# Patient Record
Sex: Female | Born: 1965
Health system: Southern US, Community
[De-identification: ages and names within clinical notes are randomized; demographics above are authoritative.]

## PROBLEM LIST (undated history)

## (undated) DIAGNOSIS — M1990 Unspecified osteoarthritis, unspecified site: Secondary | ICD-10-CM

## (undated) DIAGNOSIS — I10 Essential (primary) hypertension: Secondary | ICD-10-CM

## (undated) DIAGNOSIS — T7840XA Allergy, unspecified, initial encounter: Secondary | ICD-10-CM

## (undated) HISTORY — DX: Allergy, unspecified, initial encounter: T78.40XA

## (undated) HISTORY — PX: DILATION AND CURETTAGE OF UTERUS: SHX78

## (undated) HISTORY — DX: Essential (primary) hypertension: I10

## (undated) HISTORY — DX: Unspecified osteoarthritis, unspecified site: M19.90

---
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
## 2001-05-07 ENCOUNTER — Emergency Department (HOSPITAL_COMMUNITY): Admission: EM | Admit: 2001-05-07 | Discharge: 2001-05-07 | Payer: Self-pay | Admitting: *Deleted

## 2001-05-07 ENCOUNTER — Encounter: Payer: Self-pay | Admitting: Emergency Medicine

## 2001-06-16 ENCOUNTER — Other Ambulatory Visit: Admission: RE | Admit: 2001-06-16 | Discharge: 2001-06-16 | Payer: Self-pay | Admitting: Obstetrics and Gynecology

## 2001-09-06 ENCOUNTER — Encounter: Admission: RE | Admit: 2001-09-06 | Discharge: 2001-09-06 | Payer: Self-pay | Admitting: Family Medicine

## 2001-09-06 ENCOUNTER — Encounter: Payer: Self-pay | Admitting: Family Medicine

## 2002-07-03 ENCOUNTER — Other Ambulatory Visit: Admission: RE | Admit: 2002-07-03 | Discharge: 2002-07-03 | Payer: Self-pay | Admitting: Obstetrics and Gynecology

## 2002-07-11 ENCOUNTER — Encounter: Payer: Self-pay | Admitting: Obstetrics and Gynecology

## 2002-07-11 ENCOUNTER — Encounter: Admission: RE | Admit: 2002-07-11 | Discharge: 2002-07-11 | Payer: Self-pay | Admitting: Obstetrics and Gynecology

## 2002-08-29 ENCOUNTER — Encounter: Admission: RE | Admit: 2002-08-29 | Discharge: 2002-08-29 | Payer: Self-pay | Admitting: Family Medicine

## 2002-08-29 ENCOUNTER — Encounter: Payer: Self-pay | Admitting: Family Medicine

## 2002-08-31 ENCOUNTER — Inpatient Hospital Stay (HOSPITAL_COMMUNITY): Admission: AD | Admit: 2002-08-31 | Discharge: 2002-09-01 | Payer: Self-pay | Admitting: Family Medicine

## 2002-08-31 ENCOUNTER — Encounter: Payer: Self-pay | Admitting: Family Medicine

## 2003-08-28 ENCOUNTER — Other Ambulatory Visit: Admission: RE | Admit: 2003-08-28 | Discharge: 2003-08-28 | Payer: Self-pay | Admitting: Obstetrics and Gynecology

## 2004-01-02 ENCOUNTER — Encounter (INDEPENDENT_AMBULATORY_CARE_PROVIDER_SITE_OTHER): Payer: Self-pay | Admitting: Specialist

## 2004-01-02 ENCOUNTER — Ambulatory Visit (HOSPITAL_COMMUNITY): Admission: RE | Admit: 2004-01-02 | Discharge: 2004-01-02 | Payer: Self-pay | Admitting: Obstetrics and Gynecology

## 2004-12-02 ENCOUNTER — Ambulatory Visit: Payer: Self-pay | Admitting: Family Medicine

## 2005-01-12 ENCOUNTER — Emergency Department (HOSPITAL_COMMUNITY): Admission: EM | Admit: 2005-01-12 | Discharge: 2005-01-13 | Payer: Self-pay | Admitting: Emergency Medicine

## 2005-03-26 ENCOUNTER — Other Ambulatory Visit: Admission: RE | Admit: 2005-03-26 | Discharge: 2005-03-26 | Payer: Self-pay | Admitting: Obstetrics and Gynecology

## 2005-07-01 ENCOUNTER — Ambulatory Visit (HOSPITAL_COMMUNITY): Admission: RE | Admit: 2005-07-01 | Discharge: 2005-07-01 | Payer: Self-pay | Admitting: Obstetrics and Gynecology

## 2005-11-11 ENCOUNTER — Ambulatory Visit (HOSPITAL_COMMUNITY): Admission: RE | Admit: 2005-11-11 | Discharge: 2005-11-11 | Payer: Self-pay | Admitting: Obstetrics and Gynecology

## 2005-11-11 ENCOUNTER — Encounter (INDEPENDENT_AMBULATORY_CARE_PROVIDER_SITE_OTHER): Payer: Self-pay | Admitting: Specialist

## 2005-12-09 ENCOUNTER — Ambulatory Visit: Payer: Self-pay | Admitting: Oncology

## 2005-12-16 LAB — MORPHOLOGY: PLT EST: ADEQUATE

## 2005-12-16 LAB — CBC WITH DIFFERENTIAL/PLATELET
BASO%: 0.5 % (ref 0.0–2.0)
Basophils Absolute: 0 10*3/uL (ref 0.0–0.1)
EOS%: 3.8 % (ref 0.0–7.0)
HCT: 39.3 % (ref 34.8–46.6)
HGB: 13.2 g/dL (ref 11.6–15.9)
MCHC: 33.6 g/dL (ref 32.0–36.0)
MONO#: 0.3 10*3/uL (ref 0.1–0.9)
NEUT%: 65.1 % (ref 39.6–76.8)
RDW: 13.7 % (ref 11.3–14.5)
WBC: 4.7 10*3/uL (ref 3.9–10.0)
lymph#: 1.1 10*3/uL (ref 0.9–3.3)

## 2005-12-19 LAB — ANTITHROMBIN III: AntiThromb III Func: 100 % (ref 75–120)

## 2005-12-19 LAB — HOMOCYSTEINE: Homocysteine: 10.7 umol/L (ref 4.0–15.4)

## 2005-12-19 LAB — FACTOR 5 LEIDEN

## 2006-04-15 ENCOUNTER — Ambulatory Visit: Payer: Self-pay | Admitting: Family Medicine

## 2006-04-27 ENCOUNTER — Ambulatory Visit: Payer: Self-pay | Admitting: Family Medicine

## 2006-06-01 ENCOUNTER — Encounter: Admission: RE | Admit: 2006-06-01 | Discharge: 2006-06-01 | Payer: Self-pay | Admitting: Obstetrics and Gynecology

## 2006-12-31 ENCOUNTER — Ambulatory Visit: Payer: Self-pay | Admitting: Family Medicine

## 2007-01-03 ENCOUNTER — Ambulatory Visit: Payer: Self-pay | Admitting: Internal Medicine

## 2007-01-04 ENCOUNTER — Ambulatory Visit: Payer: Self-pay | Admitting: Family Medicine

## 2007-01-04 ENCOUNTER — Encounter: Payer: Self-pay | Admitting: Family Medicine

## 2007-01-04 ENCOUNTER — Other Ambulatory Visit: Admission: RE | Admit: 2007-01-04 | Discharge: 2007-01-04 | Payer: Self-pay | Admitting: Family Medicine

## 2007-01-04 LAB — CONVERTED CEMR LAB
ALT: 12 units/L (ref 0–40)
AST: 14 units/L (ref 0–37)
Basophils Relative: 0.6 % (ref 0.0–1.0)
Bilirubin, Direct: 0.1 mg/dL (ref 0.0–0.3)
CO2: 27 meq/L (ref 19–32)
Calcium: 9.4 mg/dL (ref 8.4–10.5)
Chloride: 104 meq/L (ref 96–112)
Creatinine, Ser: 0.8 mg/dL (ref 0.4–1.2)
Eosinophils Absolute: 0.1 10*3/uL (ref 0.0–0.6)
Eosinophils Relative: 1.6 % (ref 0.0–5.0)
GFR calc non Af Amer: 84 mL/min
Glucose, Bld: 83 mg/dL (ref 70–99)
HCT: 38.2 % (ref 36.0–46.0)
Platelets: 309 10*3/uL (ref 150–400)
RBC: 4.19 M/uL (ref 3.87–5.11)
RDW: 13.6 % (ref 11.5–14.6)
Total Bilirubin: 1.1 mg/dL (ref 0.3–1.2)
Total CHOL/HDL Ratio: 2.4
Triglycerides: 63 mg/dL (ref 0–149)
VLDL: 13 mg/dL (ref 0–40)
WBC: 5.1 10*3/uL (ref 4.5–10.5)

## 2007-02-10 DIAGNOSIS — J309 Allergic rhinitis, unspecified: Secondary | ICD-10-CM | POA: Insufficient documentation

## 2007-04-05 ENCOUNTER — Ambulatory Visit: Payer: Self-pay | Admitting: Family Medicine

## 2007-04-05 DIAGNOSIS — I10 Essential (primary) hypertension: Secondary | ICD-10-CM | POA: Insufficient documentation

## 2007-04-05 DIAGNOSIS — R209 Unspecified disturbances of skin sensation: Secondary | ICD-10-CM | POA: Insufficient documentation

## 2007-04-12 ENCOUNTER — Encounter: Admission: RE | Admit: 2007-04-12 | Discharge: 2007-04-12 | Payer: Self-pay | Admitting: Family Medicine

## 2007-04-12 ENCOUNTER — Telehealth: Payer: Self-pay | Admitting: Family Medicine

## 2007-04-27 ENCOUNTER — Ambulatory Visit: Payer: Self-pay | Admitting: Family Medicine

## 2007-06-06 ENCOUNTER — Encounter: Admission: RE | Admit: 2007-06-06 | Discharge: 2007-06-06 | Payer: Self-pay | Admitting: Obstetrics and Gynecology

## 2007-11-15 ENCOUNTER — Ambulatory Visit: Payer: Self-pay | Admitting: Family Medicine

## 2007-11-15 DIAGNOSIS — N200 Calculus of kidney: Secondary | ICD-10-CM | POA: Insufficient documentation

## 2007-11-15 LAB — CONVERTED CEMR LAB
Ketones, urine, test strip: NEGATIVE
Nitrite: NEGATIVE
Urobilinogen, UA: NEGATIVE
pH: 6

## 2007-12-06 ENCOUNTER — Ambulatory Visit: Payer: Self-pay | Admitting: Family Medicine

## 2007-12-06 DIAGNOSIS — G43809 Other migraine, not intractable, without status migrainosus: Secondary | ICD-10-CM | POA: Insufficient documentation

## 2008-01-19 ENCOUNTER — Telehealth: Payer: Self-pay | Admitting: *Deleted

## 2008-02-03 ENCOUNTER — Ambulatory Visit: Payer: Self-pay | Admitting: Family Medicine

## 2008-02-03 LAB — CONVERTED CEMR LAB
AST: 20 units/L (ref 0–37)
Albumin: 3.8 g/dL (ref 3.5–5.2)
Alkaline Phosphatase: 35 units/L — ABNORMAL LOW (ref 39–117)
BUN: 18 mg/dL (ref 6–23)
Chloride: 102 meq/L (ref 96–112)
Eosinophils Relative: 3.6 % (ref 0.0–5.0)
Glucose, Bld: 91 mg/dL (ref 70–99)
HDL: 69.7 mg/dL (ref >39.0)
Lymphocytes Relative: 24.3 % (ref 12.0–46.0)
Monocytes Relative: 7.6 % (ref 3.0–12.0)
Neutrophils Relative %: 63.9 % (ref 43.0–77.0)
Nitrite: POSITIVE
Platelets: 282 10*3/uL (ref 150–400)
Potassium: 3.8 meq/L (ref 3.5–5.1)
Protein, U semiquant: NEGATIVE
Total CHOL/HDL Ratio: 2.6
Total Protein: 6.7 g/dL (ref 6.0–8.3)
VLDL: 10 mg/dL (ref 0–40)
WBC: 4.5 10*3/uL (ref 4.5–10.5)

## 2008-02-10 ENCOUNTER — Ambulatory Visit: Payer: Self-pay | Admitting: Family Medicine

## 2008-02-10 ENCOUNTER — Other Ambulatory Visit: Admission: RE | Admit: 2008-02-10 | Discharge: 2008-02-10 | Payer: Self-pay | Admitting: Family Medicine

## 2008-02-10 ENCOUNTER — Encounter: Payer: Self-pay | Admitting: Family Medicine

## 2008-02-10 DIAGNOSIS — N6019 Diffuse cystic mastopathy of unspecified breast: Secondary | ICD-10-CM | POA: Insufficient documentation

## 2008-02-10 DIAGNOSIS — R51 Headache: Secondary | ICD-10-CM | POA: Insufficient documentation

## 2008-02-10 DIAGNOSIS — R519 Headache, unspecified: Secondary | ICD-10-CM | POA: Insufficient documentation

## 2008-04-17 ENCOUNTER — Encounter: Payer: Self-pay | Admitting: Family Medicine

## 2008-04-17 ENCOUNTER — Ambulatory Visit: Payer: Self-pay | Admitting: Family Medicine

## 2008-04-19 DIAGNOSIS — L820 Inflamed seborrheic keratosis: Secondary | ICD-10-CM | POA: Insufficient documentation

## 2008-04-25 ENCOUNTER — Telehealth: Payer: Self-pay | Admitting: Family Medicine

## 2008-06-06 ENCOUNTER — Encounter: Admission: RE | Admit: 2008-06-06 | Discharge: 2008-06-06 | Payer: Self-pay | Admitting: Family Medicine

## 2009-01-30 ENCOUNTER — Telehealth: Payer: Self-pay | Admitting: Internal Medicine

## 2009-01-30 ENCOUNTER — Telehealth: Payer: Self-pay | Admitting: Family Medicine

## 2009-01-30 ENCOUNTER — Emergency Department (HOSPITAL_COMMUNITY): Admission: EM | Admit: 2009-01-30 | Discharge: 2009-01-30 | Payer: Self-pay | Admitting: Emergency Medicine

## 2009-01-31 ENCOUNTER — Ambulatory Visit: Payer: Self-pay | Admitting: Family Medicine

## 2009-01-31 DIAGNOSIS — K219 Gastro-esophageal reflux disease without esophagitis: Secondary | ICD-10-CM | POA: Insufficient documentation

## 2009-04-17 ENCOUNTER — Ambulatory Visit: Payer: Self-pay | Admitting: Family Medicine

## 2009-04-17 LAB — CONVERTED CEMR LAB
ALT: 13 units/L (ref 0–35)
AST: 20 units/L (ref 0–37)
Alkaline Phosphatase: 39 units/L (ref 39–117)
Basophils Relative: 0.1 % (ref 0.0–3.0)
Bilirubin, Direct: 0.1 mg/dL (ref 0.0–0.3)
Calcium: 9.3 mg/dL (ref 8.4–10.5)
Chloride: 105 meq/L (ref 96–112)
Creatinine, Ser: 0.8 mg/dL (ref 0.4–1.2)
Eosinophils Relative: 3.6 % (ref 0.0–5.0)
GFR calc non Af Amer: 83.29 mL/min (ref >60)
Ketones, urine, test strip: NEGATIVE
LDL Cholesterol: 104 mg/dL — ABNORMAL HIGH (ref 0–99)
Lymphocytes Relative: 19.1 % (ref 12.0–46.0)
MCV: 90 fL (ref 78.0–100.0)
Monocytes Relative: 7.1 % (ref 3.0–12.0)
Neutrophils Relative %: 70.1 % (ref 43.0–77.0)
Nitrite: NEGATIVE
RBC: 4.08 M/uL (ref 3.87–5.11)
Total Bilirubin: 1.2 mg/dL (ref 0.3–1.2)
Total CHOL/HDL Ratio: 3
Total Protein: 7.4 g/dL (ref 6.0–8.3)
Triglycerides: 52 mg/dL (ref 0.0–149.0)
Urobilinogen, UA: 1
VLDL: 10.4 mg/dL (ref 0.0–40.0)
WBC: 4.6 10*3/uL (ref 4.5–10.5)

## 2009-05-10 ENCOUNTER — Other Ambulatory Visit: Admission: RE | Admit: 2009-05-10 | Discharge: 2009-05-10 | Payer: Self-pay | Admitting: Family Medicine

## 2009-05-10 ENCOUNTER — Encounter: Payer: Self-pay | Admitting: Family Medicine

## 2009-05-10 ENCOUNTER — Ambulatory Visit: Payer: Self-pay | Admitting: Family Medicine

## 2009-05-10 DIAGNOSIS — R141 Gas pain: Secondary | ICD-10-CM | POA: Insufficient documentation

## 2009-05-10 DIAGNOSIS — R142 Eructation: Secondary | ICD-10-CM

## 2009-05-10 DIAGNOSIS — R143 Flatulence: Secondary | ICD-10-CM

## 2009-05-16 ENCOUNTER — Encounter: Admission: RE | Admit: 2009-05-16 | Discharge: 2009-05-16 | Payer: Self-pay | Admitting: Family Medicine

## 2009-06-18 ENCOUNTER — Encounter: Payer: Self-pay | Admitting: Family Medicine

## 2009-06-24 ENCOUNTER — Encounter: Admission: RE | Admit: 2009-06-24 | Discharge: 2009-06-24 | Payer: Self-pay | Admitting: Family Medicine

## 2009-07-22 ENCOUNTER — Telehealth: Payer: Self-pay | Admitting: Family Medicine

## 2009-07-25 ENCOUNTER — Ambulatory Visit: Payer: Self-pay | Admitting: Family Medicine

## 2009-08-28 ENCOUNTER — Ambulatory Visit: Payer: Self-pay | Admitting: Family Medicine

## 2010-05-08 ENCOUNTER — Ambulatory Visit: Payer: Self-pay | Admitting: Family Medicine

## 2010-05-08 LAB — CONVERTED CEMR LAB
BUN: 16 mg/dL (ref 6–23)
Basophils Absolute: 0 10*3/uL (ref 0.0–0.1)
Bilirubin Urine: NEGATIVE
Bilirubin, Direct: 0.1 mg/dL (ref 0.0–0.3)
Chloride: 101 meq/L (ref 96–112)
Cholesterol: 193 mg/dL (ref 0–200)
Creatinine, Ser: 0.8 mg/dL (ref 0.4–1.2)
Eosinophils Absolute: 0.1 10*3/uL (ref 0.0–0.7)
Eosinophils Relative: 1.4 % (ref 0.0–5.0)
Glucose, Bld: 96 mg/dL (ref 70–99)
Glucose, Urine, Semiquant: NEGATIVE
HCT: 40.6 % (ref 36.0–46.0)
LDL Cholesterol: 113 mg/dL — ABNORMAL HIGH (ref 0–99)
Lymphs Abs: 1.5 10*3/uL (ref 0.7–4.0)
MCV: 91.3 fL (ref 78.0–100.0)
Monocytes Absolute: 0.4 10*3/uL (ref 0.1–1.0)
Neutrophils Relative %: 64 % (ref 43.0–77.0)
Platelets: 330 10*3/uL (ref 150.0–400.0)
Potassium: 4.5 meq/L (ref 3.5–5.1)
RDW: 13.7 % (ref 11.5–14.6)
TSH: 0.94 microintl units/mL (ref 0.35–5.50)
Total Bilirubin: 0.8 mg/dL (ref 0.3–1.2)
Triglycerides: 50 mg/dL (ref 0.0–149.0)
Urobilinogen, UA: 1
VLDL: 10 mg/dL (ref 0.0–40.0)
WBC: 5.5 10*3/uL (ref 4.5–10.5)
pH: 6

## 2010-05-13 ENCOUNTER — Other Ambulatory Visit: Admission: RE | Admit: 2010-05-13 | Discharge: 2010-05-13 | Payer: Self-pay | Admitting: Family Medicine

## 2010-05-13 ENCOUNTER — Ambulatory Visit: Payer: Self-pay | Admitting: Family Medicine

## 2010-05-13 LAB — CONVERTED CEMR LAB: Pap Smear: NEGATIVE

## 2010-05-13 LAB — HM PAP SMEAR

## 2010-06-10 ENCOUNTER — Ambulatory Visit: Payer: Self-pay | Admitting: Family Medicine

## 2010-06-26 ENCOUNTER — Encounter: Admission: RE | Admit: 2010-06-26 | Discharge: 2010-06-26 | Payer: Self-pay | Admitting: Family Medicine

## 2010-06-26 LAB — HM MAMMOGRAPHY

## 2010-08-26 NOTE — Assessment & Plan Note (Signed)
Summary: cpx/pap/flu shot/njr   Vital Signs:  Patient profile:   45 year old female Menstrual status:  regular LMP:     05/09/2010 Height:      62.5 inches Weight:      127 pounds BMI:     22.94 Temp:     99.1 degrees F oral BP sitting:   110 / 80  (left arm) Cuff size:   regular  Vitals Entered By: Kern Reap CMA Duncan Dull) (May 13, 2010 3:50 PM) CC: cpx LMP (date): 05/09/2010     Enter LMP: 05/09/2010 Last PAP Result NEGATIVE FOR INTRAEPITHELIAL LESIONS OR MALIGNANCY.   CC:  cpx.  History of Present Illness: michaele is a 45 year old, married female, nonsmoker......... she and her husband adopted a baby girl this year........ who comes in today for general physical evaluation.  At this always been in excellent, health.  She's had no chronic health problems.  She's had a little bit of problem.  Every now and then with the constipation, diarrhea, type of IBS.  She currently has some right lower quadrant pain that comes and goes.  Review of systems otherwise negative.  LMP 1014.  No birth control.  She is never gotten pregnant.  Tetanus 2002.  She takes hydrochlorothiazide 25 mg daily for mild hypertension.  BP 110/80.  Jomarie Longs takes Prevacid 15 mg daily for some reflux esophagitis.  Allergies (verified): No Known Drug Allergies  Past History:  Past medical, surgical, family and social histories (including risk factors) reviewed, and no changes noted (except as noted below).  Past Medical History: Reviewed history from 02/10/2008 and no changes required. Allergic rhinitis Hypertension Headache  Past Surgical History: Reviewed history from 02/10/2007 and no changes required. Inguinal herniorrhaphy  Family History: Reviewed history from 02/10/2007 and no changes required. Family History Hypertension Family History Other cancer-Prostate  Social History: Reviewed history from 04/05/2007 and no changes required. Never Smoked Alcohol use-yes-occas Regular  exercise-yes Married  Review of Systems      See HPI       Flu Vaccine Consent Questions     Do you have a history of severe allergic reactions to this vaccine? no    Any prior history of allergic reactions to egg and/or gelatin? no    Do you have a sensitivity to the preservative Thimersol? no    Do you have a past history of Guillan-Barre Syndrome? no    Do you currently have an acute febrile illness? no    Have you ever had a severe reaction to latex? no    Vaccine information given and explained to patient? yes    Are you currently pregnant? no    Lot Number:[id number]   Exp Date:01/24/2011   Site Given  Left Deltoid IM   Physical Exam  General:  Well-developed,well-nourished,in no acute distress; alert,appropriate and cooperative throughout examination Head:  Normocephalic and atraumatic without obvious abnormalities. No apparent alopecia or balding. Eyes:  No corneal or conjunctival inflammation noted. EOMI. Perrla. Funduscopic exam benign, without hemorrhages, exudates or papilledema. Vision grossly normal. Ears:  External ear exam shows no significant lesions or deformities.  Otoscopic examination reveals clear canals, tympanic membranes are intact bilaterally without bulging, retraction, inflammation or discharge. Hearing is grossly normal bilaterally. Nose:  External nasal examination shows no deformity or inflammation. Nasal mucosa are pink and moist without lesions or exudates. Mouth:  Oral mucosa and oropharynx without lesions or exudates.  Teeth in good repair. Neck:  No deformities, masses, or tenderness noted. Chest Wall:  No deformities, masses, or tenderness noted. Breasts:  No mass, nodules, thickening, tenderness, bulging, retraction, inflamation, nipple discharge or skin changes noted.   Lungs:  Normal respiratory effort, chest expands symmetrically. Lungs are clear to auscultation, no crackles or wheezes. Heart:  Normal rate and regular rhythm. S1 and S2 normal  without gallop, murmur, click, rub or other extra sounds. Abdomen:  Bowel sounds positive,abdomen soft and non-tender without masses, organomegaly or hernias noted. Rectal:  No external abnormalities noted. Normal sphincter tone. No rectal masses or tenderness. Genitalia:  Pelvic Exam:        External: normal female genitalia without lesions or masses        Vagina: normal without lesions or masses        Cervix: normal without lesions or masses        Adnexa: normal bimanual exam without masses or fullness        Uterus: normal by palpation        Pap smear: performed Msk:  No deformity or scoliosis noted of thoracic or lumbar spine.   Pulses:  R and L carotid,radial,femoral,dorsalis pedis and posterior tibial pulses are full and equal bilaterally Extremities:  No clubbing, cyanosis, edema, or deformity noted with normal full range of motion of all joints.   Neurologic:  No cranial nerve deficits noted. Station and gait are normal. Plantar reflexes are down-going bilaterally. DTRs are symmetrical throughout. Sensory, motor and coordinative functions appear intact. Skin:  Intact without suspicious lesions or rashes Cervical Nodes:  No lymphadenopathy noted Axillary Nodes:  No palpable lymphadenopathy Inguinal Nodes:  No significant adenopathy Psych:  Cognition and judgment appear intact. Alert and cooperative with normal attention span and concentration. No apparent delusions, illusions, hallucinations   Impression & Recommendations:  Problem # 1:  PHYSICAL EXAMINATION (ICD-V70.0) Assessment Unchanged  Problem # 2:  GERD (ICD-530.81) Assessment: Improved  Her updated medication list for this problem includes:    Prevacid 15 Mg Cpdr (Lansoprazole) .Marland Kitchen... As needed  Problem # 3:  HYPERTENSION (ICD-401.9) Assessment: Improved  Her updated medication list for this problem includes:    Hydrochlorothiazide 25 Mg Tabs (Hydrochlorothiazide) ..... Qam  Complete Medication List: 1)   Hydrochlorothiazide 25 Mg Tabs (Hydrochlorothiazide) .... Qam 2)  Prevacid 15 Mg Cpdr (Lansoprazole) .... As needed  Other Orders: Admin 1st Vaccine (16109) Flu Vaccine 59yrs + (60454)  Patient Instructions: 1)  Please schedule a follow-up appointment in 1 year. 2)  Schedule your mammogram//////////.BSE monthly Prescriptions: HYDROCHLOROTHIAZIDE 25 MG  TABS (HYDROCHLOROTHIAZIDE) QAM  #100 Tablet x 3   Entered and Authorized by:   Roderick Pee MD   Signed by:   Roderick Pee MD on 05/13/2010   Method used:   Electronically to        [facility]. [phone]* (retail)       [address]       [place], [place]  [address]       Ph: [phone]       Fax: [phone]   RxID:   [id number]    Orders Added: 1)  Est. Patient 40-64 years [99396] 2)  Admin 1st Vaccine [90471] 3)  Flu Vaccine 59yrs + [phone]

## 2010-08-26 NOTE — Assessment & Plan Note (Signed)
Summary: SINUSITIS? // RS   Vital Signs:  Patient profile:   45 year old female Menstrual status:  regular Weight:      126 pounds Temp:     99.0 degrees F oral BP sitting:   110 / 78  (left arm) Cuff size:   regular  Vitals Entered By: Kern Reap CMA Duncan Dull) (June 10, 2010 4:34 PM) CC: sinus pressure   CC:  sinus pressure.  History of Present Illness: Grace [name] is a 45 year old female, married, nonsmoker, who comes in today for evaluation of allergic rhinitis.  The past 4, weeks.  She's had a congestion, sore throat, and postnasal drip.  Also, some congestion in her chest.  No wheezing.  No history of previous allergic problems in the past, although she does have down pillows, and a dog in the house  Allergies: No Known Drug Allergies  Past History:  Past medical, surgical, family and social histories (including risk factors) reviewed for relevance to current acute and chronic problems.  Past Medical History: Reviewed history from 02/10/2008 and no changes required. Allergic rhinitis Hypertension Headache  Past Surgical History: Reviewed history from 02/10/2007 and no changes required. Inguinal herniorrhaphy  Family History: Reviewed history from 02/10/2007 and no changes required. Family History Hypertension Family History Other cancer-Prostate  Social History: Reviewed history from 04/05/2007 and no changes required. Never Smoked Alcohol use-yes-occas Regular exercise-yes Married  Review of Systems      See HPI  Physical Exam  General:  Well-developed,well-nourished,in no acute distress; alert,appropriate and cooperative throughout examination Head:  Normocephalic and atraumatic without obvious abnormalities. No apparent alopecia or balding. Eyes:  No corneal or conjunctival inflammation noted. EOMI. Perrla. Funduscopic exam benign, without hemorrhages, exudates or papilledema. Vision grossly normal. Ears:  External ear exam shows no significant lesions  or deformities.  Otoscopic examination reveals clear canals, tympanic membranes are intact bilaterally without bulging, retraction, inflammation or discharge. Hearing is grossly normal bilaterally. Nose:  External nasal examination shows no deformity or inflammation. Nasal mucosa are pink and moist without lesions or exudates. Mouth:  Oral mucosa and oropharynx without lesions or exudates.  Teeth in good repair. Neck:  No deformities, masses, or tenderness noted. Chest Wall:  No deformities, masses, or tenderness noted. Lungs:  Normal respiratory effort, chest expands symmetrically. Lungs are clear to auscultation, no crackles or wheezes.   Impression & Recommendations:  Problem # 1:  ALLERGIC RHINITIS (ICD-477.9) Assessment New  Complete Medication List: 1)  Hydrochlorothiazide 25 Mg Tabs (Hydrochlorothiazide) .... Qam 2)  Prevacid 15 Mg Cpdr (Lansoprazole) .... As needed 3)  Prednisone 20 Mg Tabs (Prednisone) .... Uad  Patient Instructions: 1)  take a  short course of prednisone, one tablet daily, x 3 days, a half x 3 days, then half a tablet Monday, Wednesday, Friday, for a two week taper.  If after this u r  still symptomatic,   Take plain Claritin in the morning or plain Zyrtec at bedtime Prescriptions: PREDNISONE 20 MG TABS (PREDNISONE) UAD  #30 x 1   Entered and Authorized by:   Roderick Pee MD   Signed by:   Roderick Pee MD on 06/10/2010   Method used:   Electronically to        [facility]. [phone]* (retail)       [phone]       [place], [place]  [address]       Ph: [phone]  Fax: [phone]   RxID:   [id number]    Orders Added: 1)  Est. Patient Level III [84696]

## 2010-08-26 NOTE — Assessment & Plan Note (Signed)
Summary: SORE THROAT, HEAD / CHEST CONGESTION // RS   Vital Signs:  Patient profile:   45 year old female Menstrual status:  regular Temp:     99.1 degrees F oral BP sitting:   110 / 84  (left arm) Cuff size:   regular  Vitals Entered By: Sid Falcon LPN (August 28, 2009 4:21 PM) CC: Sinus congestion, head pressure X 3 days   History of Present Illness: Patient seen as a work in with 2 day history of mild sore throat, frontal sinus pressure, mild chest congestion and mild intermittent headache. Throat actually feels better today. Concerned because she and her husband plan to adopt soon. No definite fever. No nausea, vomiting, or diarrhea. Has not taken any over-the-counter medications. Nonsmoker  Preventive Screening-Counseling & Management  Alcohol-Tobacco     Smoking Status: never  Allergies (verified): No Known Drug Allergies  Past History:  Past Medical History: Last updated: 02/10/2008 Allergic rhinitis Hypertension Headache PMH reviewed for relevance  Social History: Smoking Status:  never  Review of Systems      See HPI  Physical Exam  General:  Well-developed,well-nourished,in no acute distress; alert,appropriate and cooperative throughout examination Head:  Normocephalic and atraumatic without obvious abnormalities. No apparent alopecia or balding. Eyes:  pupils equal, pupils round, pupils reactive to light, and no injection.   Ears:  External ear exam shows no significant lesions or deformities.  Otoscopic examination reveals clear canals, tympanic membranes are intact bilaterally without bulging, retraction, inflammation or discharge. Hearing is grossly normal bilaterally. Nose:  External nasal examination shows no deformity or inflammation. Nasal mucosa are pink and moist without lesions or exudates. Mouth:  Oral mucosa and oropharynx without lesions or exudates.  Teeth in good repair. Neck:  No deformities, masses, or tenderness noted. Lungs:  Normal  respiratory effort, chest expands symmetrically. Lungs are clear to auscultation, no crackles or wheezes. Heart:  normal rate, regular rhythm, and no murmur.     Impression & Recommendations:  Problem # 1:  VIRAL URI (ICD-465.9) OTC mucinex and sudafed.  Complete Medication List: 1)  Hydrochlorothiazide 25 Mg Tabs (Hydrochlorothiazide) .... Qam 2)  Prevacid 15 Mg Cpdr (Lansoprazole) .... As needed  Patient Instructions: 1)  Consider short-term use of over-the-counter Sudafed 2)  Consider use of Netti pot for sinus irrigation if you have any progressive nasal or sinus congestion. 3)  Get plenty of rest, drink lots of clear liquids, and use Tylenol or Ibuprofen for fever and comfort. Return in 7-10 days if you're not better: sooner if you'er feeling worse.

## 2010-10-09 ENCOUNTER — Encounter: Payer: Self-pay | Admitting: Family Medicine

## 2010-10-09 ENCOUNTER — Ambulatory Visit (INDEPENDENT_AMBULATORY_CARE_PROVIDER_SITE_OTHER): Payer: 59 | Admitting: Family Medicine

## 2010-10-09 DIAGNOSIS — R141 Gas pain: Secondary | ICD-10-CM

## 2010-10-09 DIAGNOSIS — N63 Unspecified lump in unspecified breast: Secondary | ICD-10-CM

## 2010-10-09 DIAGNOSIS — N6019 Diffuse cystic mastopathy of unspecified breast: Secondary | ICD-10-CM

## 2010-10-09 DIAGNOSIS — R143 Flatulence: Secondary | ICD-10-CM

## 2010-10-09 DIAGNOSIS — N6320 Unspecified lump in the left breast, unspecified quadrant: Secondary | ICD-10-CM

## 2010-10-09 NOTE — Patient Instructions (Signed)
Stent a complete lactose-free diet, your symptoms of abdominal bloating, and diarrhea should abate within the next week to two weeks if not can call me.  We will set up an ultrasound to define the cystic lesion near left breast.  Return in a couple days after the ultrasound to review the studies

## 2010-10-09 NOTE — Progress Notes (Signed)
  Subjective:    Patient ID: Grace [name], female    DOB: 02-20-1966, 45 y.o.   MRN: [id number]  HPIBeth is a delightful, 45 year old, married female, nonsmoker G0, P0, who has a 32-month-old adopted child who comes in today for a violation of soreness in her left breast and abdominal bloating with diarrhea.  She has a history of fibrocystic breast changes.  However, in the last couple, months she's noticed more soreness in her left breast in the axillary area.  Her mammogram in December was normal.  Negative family history for breast cancer.  For the past two to 3, months.  She's had abdominal bloating, and diarrhea after eating, especially when she drinks a lot of milk.  Her sister has the same problem and has been diagnosed to have lactase deficiency.  She said no nausea, vomiting, weight loss, etc..    Review of Systems    Otherwise negative Objective:   Physical Exam    Well-developed well-nourished, female in no acute distress.  Examination of both breasts shows cystic lesions throughout both breasts.  However, in the left breast at the 3 o'clock position.  There is an egg-sized cystic lesion in its movable.  Extremely tender.  This correlates with her pain.  Abdominal exam... Abdomen is flat.  Bowel sounds are normal.  No tenderness.  No masses    Assessment & Plan:  History of fibrocystic breast disease, now with the very large cyst in left breast 3 o'clock.  Plan ultrasound to define anatomy.  History of abdominal bloating and diarrhea secondary to milk consumption most likely lactase deficiency.  Avoid lactose for one to two weeks to see if symptoms abate if not return for follow-up

## 2010-10-10 ENCOUNTER — Other Ambulatory Visit: Payer: Self-pay | Admitting: Family Medicine

## 2010-10-10 DIAGNOSIS — N6320 Unspecified lump in the left breast, unspecified quadrant: Secondary | ICD-10-CM

## 2010-10-16 ENCOUNTER — Ambulatory Visit
Admission: RE | Admit: 2010-10-16 | Discharge: 2010-10-16 | Disposition: A | Discharge status: Home / Self Care | Payer: 59 | Source: Ambulatory Visit | Attending: Family Medicine | Admitting: Family Medicine

## 2010-10-16 ENCOUNTER — Other Ambulatory Visit: Payer: 59

## 2010-10-16 DIAGNOSIS — N6320 Unspecified lump in the left breast, unspecified quadrant: Secondary | ICD-10-CM

## 2010-11-02 LAB — CBC
MCV: 90.9 fL (ref 78.0–100.0)
Platelets: 332 10*3/uL (ref 150–400)
RBC: 4.43 MIL/uL (ref 3.87–5.11)
WBC: 7.8 10*3/uL (ref 4.0–10.5)

## 2010-11-02 LAB — COMPREHENSIVE METABOLIC PANEL
ALT: 14 U/L (ref 0–35)
AST: 21 U/L (ref 0–37)
Albumin: 3.8 g/dL (ref 3.5–5.2)
CO2: 25 mEq/L (ref 19–32)
Chloride: 104 mEq/L (ref 96–112)
Creatinine, Ser: 0.66 mg/dL (ref 0.4–1.2)
GFR calc Af Amer: >60 mL/min (ref >60)
Potassium: 3.3 mEq/L — ABNORMAL LOW (ref 3.5–5.1)
Sodium: 139 mEq/L (ref 135–145)
Total Bilirubin: 0.7 mg/dL (ref 0.3–1.2)

## 2010-11-02 LAB — URINALYSIS, ROUTINE W REFLEX MICROSCOPIC
Nitrite: NEGATIVE
Specific Gravity, Urine: 1.018 (ref 1.005–1.030)
Urobilinogen, UA: 1 mg/dL (ref 0.0–1.0)

## 2010-11-02 LAB — URINE MICROSCOPIC-ADD ON

## 2010-11-02 LAB — POCT CARDIAC MARKERS
CKMB, poc: <1 ng/mL — ABNORMAL LOW (ref 1.0–8.0)
Myoglobin, poc: 43.1 ng/mL (ref 12–200)

## 2010-11-02 LAB — DIFFERENTIAL
Basophils Absolute: 0 10*3/uL (ref 0.0–0.1)
Eosinophils Absolute: 0.1 10*3/uL (ref 0.0–0.7)
Eosinophils Relative: 2 % (ref 0–5)
Lymphocytes Relative: 12 % (ref 12–46)
Monocytes Absolute: 0.4 10*3/uL (ref 0.1–1.0)

## 2010-12-08 ENCOUNTER — Ambulatory Visit (INDEPENDENT_AMBULATORY_CARE_PROVIDER_SITE_OTHER): Payer: 59 | Admitting: Family Medicine

## 2010-12-08 ENCOUNTER — Encounter: Payer: Self-pay | Admitting: Family Medicine

## 2010-12-08 VITALS — BP 102/70 | Temp 101.1°F | Wt 118.0 lb

## 2010-12-08 DIAGNOSIS — B341 Enterovirus infection, unspecified: Secondary | ICD-10-CM | POA: Insufficient documentation

## 2010-12-08 DIAGNOSIS — J029 Acute pharyngitis, unspecified: Secondary | ICD-10-CM

## 2010-12-08 LAB — POCT RAPID STREP A (OFFICE): Rapid Strep A Screen: NEGATIVE

## 2010-12-08 MED ORDER — HYDROCODONE-ACETAMINOPHEN 7.5-750 MG PO TABS: 1.0000 | ORAL_TABLET | Freq: Four times a day (QID) | ORAL | Status: DC | PRN

## 2010-12-08 MED ORDER — MAGIC MOUTHWASH W/LIDOCAINE: 5.0000 mL | Freq: Four times a day (QID) | ORAL | Status: DC

## 2010-12-08 NOTE — Patient Instructions (Signed)
Magic mouthwash gargle and spit 4 times daily.  Vicodin ES  one half to 1 tablet q.i.d. As needed for severe pain.  Return prn

## 2010-12-08 NOTE — Progress Notes (Signed)
  Subjective:    Patient ID: Grace [name], female    DOB: 11/21/1965, 45 y.o.   MRN: [id number]  HPI Grace [name] is a 45 year old, married female, new mom whose daughter and Grace [name] developed a viral syndrome about 10 days ago.  That resulted in a secondary infection.  Grace [name]  starts feeling bad last Thursday with a severe sore throat.  It's gotten worse.  Her pain on a scale of one to 10 is an 8 ,,,,,,,,,,, fever, chills, and aching all over.  No cough, nausea, vomiting, diarrhea, etc.   Review of Systems    otherwise, negative Objective:   Physical Exam Well-developed well-nourished, female, in no acute distress.  HEENT negative except for ulcerations on both tonsils consistent with a coxsackie infection.  Also, 3+ symmetrical lymphadenopathy.  Lungs clear.  No splenic enlargement.  No skin rash on her hands or feet       Assessment & Plan:  Coxsackie sore throat,,,,,,,,,,,,,,,,,,,,,, Tylenol,,, Vicodin p.r.n. Return p.r.n.

## 2010-12-12 NOTE — H&P (Signed)
NAME:  Grace [name], Grace [name]                           ACCOUNT NO.:  [id number]   MEDICAL RECORD NO.:  [id number]                   PATIENT TYPE:  INP   LOCATION:  0448                                 FACILITY:  [name]   PHYSICIAN:  Grace [name]. Tawanna Cooler, M.D. Grace [name]           DATE OF BIRTH:  07-07-1966   DATE OF ADMISSION:  08/31/2002  DATE OF DISCHARGE:                                HISTORY & PHYSICAL   GASTROINTESTINAL CONSULTATION:  Dr. Barnet [name].   HISTORY OF PRESENT ILLNESS:  This is the second [name]  admission for this 45 year old single white female, engaged to Grace [name],  who comes in today to be admitted for evaluation of abdominal pain, nausea,  and vomiting.   The patient was seen in the office two days ago with [name] history of abdominal  pain.  She said the night before she ate [place] food and about two hours  later developed severe abdominal pain.  She describes and points to the  midepigastric and right upper quadrant areas as the source of her pain.  She  says on [name] scale of 1-10 the pain was [name] 7 or 8.  She was nauseated, had no  vomiting.  She had no diarrhea, no fever or chills.  She took some antacids  and went to sleep, woke up during the middle of the night with more pain.  It eased off finally, and she went back to sleep.  She came to the office  the next morning with that history.  I could not find anything on physical  examination at that time.  Because of her history and her history of mother  having gallstones, we thought her symptoms may be related to acute  cholecystitis.  She, therefore, was sent for [name] stat ultrasound which turned  out to be negative.  It did show she had [name] small stone in her right kidney  but otherwise negative.  CBC was normal as was the metabolic panel, lipase,  and amylase.   She was seen back in the office after this examination in the morning.  We  discussed options.  Since she was feeling better we elected to send her home  on  clear liquids with [name] follow-up in 48 hours.  She was seen by me today for  follow-up.  At that time she said she felt better.  Her pain had eased off,  and she felt like she might want to try to eat although she was not that  hungry.  She has had no other symptoms in the meantime.   Exam this morning was negative; therefore, we let her go home.  This  afternoon she called approximately 5:30 p.m. saying she was acutely ill and  had vomited five times and was unable to stop.  She said she had no  abdominal pain but just the vomiting.  She said she finally was vomiting  bile she was retching so hard.  Again, no abdominal pain, fever, chills,  etc.  Because of persistence of the GI symptoms it was felt necessary to  admit her to the [name] for further evaluation.   PAST MEDICAL HISTORY:  She has been hospitalized as [name] child for right  inguinal herniorrhaphy at [name].  Past illnesses, none.  Injuries, none.   DRUG ALLERGIES:  None.   MEDICATIONS:  BCPs.   She missed [name] birth control pill yesterday because she was sick and could not  eat, and now she has started to spot.  Otherwise, periods have been normal.  LMP was 10 days ago, and it was normal.   TOBACCO/ALCOHOL:  She does not smoke or drink any alcohol.  She works at [name].   FAMILY HISTORY:  Father died of complications of metastatic prostate cancer.  Mother has [name] history of gallstones.  Two brothers, one sister:  All three  siblings in good health, no problems, and they have not had any GI problems  or gallbladder disease.   SOCIAL HISTORY:  She is 45.  She is engaged to be married to Grace [name],  who is [name] young man I have known for many years.  He played basketball at the  day school.  She works for [name] here in [place].   REVIEW OF SYSTEMS:  Otherwise review of systems negative.   PHYSICAL EXAMINATION:  VITAL SIGNS:  Temperature 98, pulse 70 and regular,  respirations 12 and  regular, BP 120/80.  GENERAL:  Well-developed, well-nourished white female in no acute distress.  HEENT:  Negative.  NECK:  Supple.  Thyroid is not enlarged.  CHEST:  Clear to auscultation.  CARDIAC:  Negative.  ABDOMEN:  The abdomen is flat.  The bowel sounds are normal.  There is some  tenderness over the area between the umbilicus and the pubis, but this was  no rebound.  I can appreciate no masses.  RECTAL:  Normal.  Stool was guaiac-negative.  PELVIC:  Deferred.  EXTREMITIES:  Normal skin.  Peripheral pulses normal.   IMPRESSION:  Severe abdominal pain four days ago, resolved, and now with  intractable vomiting.   PLAN:  Admit, IV fluids, Phenergan, GI consult.  Dr. Arlyce [name] was called and  kindly agreed to see the patient to help delineate why she is sick.                                               Grace [name]. Tawanna Cooler, M.D. [name]    JAT/MEDQ  D:  08/31/2002  T:  08/31/2002  Job:  [id number]

## 2010-12-12 NOTE — Op Note (Signed)
Grace [name], Grace [name]                  ACCOUNT NO.:  [id number]   MEDICAL RECORD NO.:  [id number]          PATIENT TYPE:  AMB   LOCATION:  SDC                           FACILITY:  [facility]   PHYSICIAN:  Michelle L. Grewal, M.D.DATE OF BIRTH:  [birth year]   DATE OF PROCEDURE:  11/11/2005  DATE OF DISCHARGE:                                 OPERATIVE REPORT   PREOPERATIVE DIAGNOSIS:  Missed abortion.   POSTOPERATIVE DIAGNOSIS:  Missed abortion.   PROCEDURE:  Dilatation and evacuation.   SURGEON:  Michelle L. Vincente Poli, M.D.   ANESTHESIA:  MAC with local.   SPECIMENS:  Products of conception.   ESTIMATED BLOOD LOSS:  Minimal.   COMPLICATIONS:  None.   DESCRIPTION OF PROCEDURE:  The procedure the patient taken to the operating  room.  She was given sedation and placed in the lithotomy position.  She was  prepped and draped in usual sterile fashion and in and out catheter was used  to empty the bladder.  An exam under anesthesia revealed the uterus was  anteverted and about 7 weeks size with no adnexal masses.  A speculum was  then placed and inserted into the vagina.  The cervix was grasped with  the  tenaculum and a paracervical block was performed in a standard fashion using  1% lidocaine at 5 and 7 o'clock.  The uterus was gently sounded to 7 cm.  The cervical internal os was gently dilated using Pratt dilators.  A #7  suction cannula was easily inserted to the uterus and the uterus was  thoroughly suctioned of all tissue.  The suction cannula was removed.  A  sharp curette was inserted and the uterus was thoroughly curetted of all  tissue.  A final suction curettage was performed.  At the end of the  procedure minimal bleeding was noted.  The uterus was firm.  All instruments  were removed from the vagina.  Half of the specimen approximately was sent  for chromosome, and the other half was sent to general pathology.  The  patient is Rh negative and will be given RhoGAM in recovery.  All  sponge,  lap and instrument counts were correct x2.  She went to the recovery room in  stable condition.      Michelle L. Vincente Poli, M.D.  Electronically Signed     MLG/MEDQ  D:  11/11/2005  T:  11/12/2005  Job:  [id number]

## 2010-12-12 NOTE — Op Note (Signed)
Grace [name], Grace [name]                            ACCOUNT NO.:  [id number]   MEDICAL RECORD NO.:  [id number]                   PATIENT TYPE:  AMB   LOCATION:  SDC                                  FACILITY:  [facility]   PHYSICIAN:  Michelle L. Vincente Poli, M.D.            DATE OF BIRTH:  07-16-1966   DATE OF PROCEDURE:  01/02/2004  DATE OF DISCHARGE:                                 OPERATIVE REPORT   PREOPERATIVE DIAGNOSES:  1. Missed abortion.  2. Rh negative.   PREOPERATIVE DIAGNOSES:  1. Missed abortion.  2. Rh negative.   SURGEON:  Michelle L. Vincente Poli, M.D.   ANESTHESIA:  Local MAC.   ESTIMATED BLOOD LOSS:  Less than 50 mL.   PROCEDURE:  The patient was taken to the operating room after informed  consent was obtained.  She was then given sedation and placed in the low  lithotomy position.  She was prepped and draped in the usual sterile  fashion.  An in-and-out catheter was used to empty the bladder.  Exam under  anesthesia revealed the uterus to be anteverted, no adnexal masses, and the  uterus was approximately seven weeks' size.  A speculum was placed in the  vagina, the cervix was grasped with a tenaculum, and a paracervical block  was performed in the standard fashion using 1% lidocaine.  The uterus was  sounded to 7 cm.  We then used the [facility] dilators to gently dilate the  cervix and a #7 suction cannula was inserted into the uterus without  difficulty.  A suction curettage was performed x3 with retrieval of contents  consistent with products of conception.  A sharp curette was then inserted  and the uterus was thoroughly curetted and noted to be gritty on all four  walls of the uterus.  A final suction curettage was then performed.  There  was no vaginal bleeding noted at the end of the procedure.  All instruments  were removed from the vagina.  All sponge, lap, and instrument counts were  correct x2.  The patient tolerated the procedure well and went to the  recovery room in  stable condition.  Of note, she will receive RhoGAM in the  recovery room because she is Rh negative.                                               Michelle L. Vincente Poli, M.D.    Florestine Avers  D:  01/02/2004  T:  01/02/2004  Job:  [id number]

## 2011-03-03 ENCOUNTER — Encounter: Payer: Self-pay | Admitting: Family Medicine

## 2011-03-03 ENCOUNTER — Ambulatory Visit (INDEPENDENT_AMBULATORY_CARE_PROVIDER_SITE_OTHER): Payer: 59 | Admitting: Family Medicine

## 2011-03-03 VITALS — BP 110/80 | Temp 98.1°F | Wt 115.0 lb

## 2011-03-03 DIAGNOSIS — J45909 Unspecified asthma, uncomplicated: Secondary | ICD-10-CM | POA: Insufficient documentation

## 2011-03-03 MED ORDER — HYDROCODONE-HOMATROPINE 5-1.5 MG/5ML PO SYRP: ORAL_SOLUTION | ORAL | Status: DC

## 2011-03-03 MED ORDER — DOXYCYCLINE HYCLATE 100 MG PO TABS: 100.0000 mg | ORAL_TABLET | Freq: Two times a day (BID) | ORAL | Status: AC

## 2011-03-03 MED ORDER — PREDNISONE 20 MG PO TABS: ORAL_TABLET | ORAL | Status: DC

## 2011-03-03 NOTE — Patient Instructions (Signed)
Drink lots of water.  Take prednisone as directed.  Doxycycline one tablet twice daily for 10 days.  Hydromet one half to 1 teaspoon 3 times a day as needed for cough.  Return in one week for follow-up

## 2011-03-03 NOTE — Progress Notes (Signed)
  Subjective:    Patient ID: Grace [name], female    DOB: 29-Jun-1966, 45 y.o.   MRN: [id number]  HPI This is a 45 year old, married female, nonsmoker, who comes in today with a two week history of coughing.  She said a history of allergic rhinitis, but no asthma.  For the past two, days.  She's been wheezing.  No fever, no sputum production.   Review of Systems    General upon review of systems otherwise negative Objective:   Physical Exam  Well-developed well-nourished, female in no acute distress.  Examination HEENT negative.  Neck was supple.  No adenopathy.  Lungs showed bilateral wheezing      Assessment & Plan:  Asthma secondary to viral syndrome.  Plan cover with aTB.,,,,,,  Rx prednisone burst and taper Hydromet p.r.n. For cough.  Return in one week

## 2011-03-10 ENCOUNTER — Encounter: Payer: Self-pay | Admitting: Family Medicine

## 2011-03-10 ENCOUNTER — Ambulatory Visit (INDEPENDENT_AMBULATORY_CARE_PROVIDER_SITE_OTHER): Payer: 59 | Admitting: Family Medicine

## 2011-03-10 VITALS — BP 108/78 | Temp 99.1°F | Wt 116.0 lb

## 2011-03-10 DIAGNOSIS — J45909 Unspecified asthma, uncomplicated: Secondary | ICD-10-CM

## 2011-03-10 MED ORDER — CLARITHROMYCIN 500 MG PO TABS: 500.0000 mg | ORAL_TABLET | Freq: Two times a day (BID) | ORAL | Status: AC

## 2011-03-10 NOTE — Progress Notes (Signed)
  Subjective:    Patient ID: Grace [name], female    DOB: 04/13/1966, 45 y.o.   MRN: [id number]  HPIBeth is a 45 year old it managing a nonsmoker, who comes in today for follow-up of asthma.  About two weeks ago she developed a viral syndrome or got around something.  She was allergic to and developed wheezing.  We start her on 40 mg of prednisone a day for 3 days.  We also gave her doxycycline 100 mg b.i.d. Because of a question of a mycoplasma pneumonia.  A.  She is down to half a tablet of prednisone a day and doesn't feel much better.  She taken the doxycycline.  Yesterday she spiked a temp of 101.  No sputum production    Review of Systems General pulmonary is systems otherwise negative    Objective:   Physical Exam Well-developed well-nourished, female in no acute distress.  Examination of the oral cavity is normal.  Lungs show symmetrical.  Breath sounds bilateral wheezing.  No improvement since the beginning of therapy       Assessment & Plan:  Asthma plan increase prednisone to 60 mg a day DC doxycycline and Biaxin.  Continue cough syrup.  Follow-up in 3 day

## 2011-03-10 NOTE — Patient Instructions (Signed)
Take 3 prednisone tablets now then 3 tablets daily.  Stop and doxycycline.  Begin Biaxin 500 mg twice daily.  Drink lots of water.  Return on Thursday afternoon for follow-up

## 2011-03-12 ENCOUNTER — Ambulatory Visit: Payer: 59 | Admitting: Family Medicine

## 2011-03-16 ENCOUNTER — Encounter: Payer: Self-pay | Admitting: Family Medicine

## 2011-03-16 ENCOUNTER — Ambulatory Visit (INDEPENDENT_AMBULATORY_CARE_PROVIDER_SITE_OTHER): Payer: 59 | Admitting: Family Medicine

## 2011-03-16 DIAGNOSIS — J45909 Unspecified asthma, uncomplicated: Secondary | ICD-10-CM

## 2011-03-16 NOTE — Patient Instructions (Signed)
One prednisone tablet x 3 days, a half x 3 days, then, starting next Monday.  A half a tablet Monday, Wednesday, Friday, for a two week taper.  Return p.r.n.

## 2011-03-16 NOTE — Progress Notes (Signed)
  Subjective:    Patient ID: Grace [name], female    DOB: 12-24-1965, 45 y.o.   MRN: [id number]  HPI Grace [name] is a 45 year old, married female, nonsmoker, who comes in today for follow-up of asthma.  We initially saw her about 10 days ago with a flare of asthma.  Following a viral syndrome.  We start her on prednisone 40 mg a day and doxycycline 100 b.i.d. However, she did not improve.  Therefore, we stopped the doxycycline switch to Biaxin, 500 b.i.d., and increased her prednisone to 60 mg daily.  She said over the weekend.  She started to feel better and he, now she's pretty much back to normal.  She had to stop the Biaxin on Friday because she developed diarrhea.  No vaginitis   Review of Systems General pulmonary review of systems otherwise negative, specifically, she's never had an episode like this before, although she has had underlying allergic rhinitis    Objective:   Physical Exam  Well-developed and nourished, female no acute distress.  Examination, lung shows symmetrical.  Breath sounds only very faint expiratory mild wheezing      Assessment & Plan:  Asthma much improved.  Plan Taper prednisone slowly.  Return p.r.n.

## 2011-05-05 ENCOUNTER — Encounter: Payer: Self-pay | Admitting: Family Medicine

## 2011-05-05 ENCOUNTER — Ambulatory Visit (INDEPENDENT_AMBULATORY_CARE_PROVIDER_SITE_OTHER): Payer: 59 | Admitting: Family Medicine

## 2011-05-05 DIAGNOSIS — R05 Cough: Secondary | ICD-10-CM

## 2011-05-05 DIAGNOSIS — J309 Allergic rhinitis, unspecified: Secondary | ICD-10-CM

## 2011-05-05 DIAGNOSIS — R059 Cough, unspecified: Secondary | ICD-10-CM | POA: Insufficient documentation

## 2011-05-05 MED ORDER — FLUTICASONE PROPIONATE 50 MCG/ACT NA SUSP: 1.0000 | Freq: Every day | NASAL | Status: DC

## 2011-05-05 NOTE — Patient Instructions (Signed)
Take a plain Ceretec, and one shot of steroid nasal spray up each nostril at bedtime.  Make an appointment to see Reather Converse for workup.  One week prior to your appointment stop the antihistamine and a steroid nasal spray

## 2011-05-05 NOTE — Progress Notes (Signed)
  Subjective:    Patient ID: IDELL HISSONG, female    DOB: 1966/03/12, 45 y.o.   MRN: [id number]  HPIBeth is a 45 year old, married female, nonsmoker G0, P71,,,,,,,,, 71-year-old adopted daughter... Comes in today for persistent cough.  She's had a history of allergic rhinitis in the past.  No history of asthma.  About 6 weeks ago.  Her allergies flared up.  She was coughing a lot and on physical examination had some mild wheezing.  She was therefore placed on a short course of prednisone, which didn't help.  She states the cough persists.  No fever no sputum production.  No shortness of breath.  Environmental review of systems negative.  They still live in the same house.  There adopted daughter has been well recently.  No symptoms of reflux   Review of Systems General allergic, and pulmonary via systems otherwise negative    Objective:   Physical Exam  Well developed, well nourished, female no acute distress.  Examination of the head, eyes, ears, nose, and throat were negative.  Neck was supple.  No adenopathy.  Lungs were clear.  Nasal septum in the midline 3+ nasal edema      Assessment & Plan:  Cough unresponsive to antihistamines, and prednisone.  Plan allergy workup.  Dr. Willa Rough

## 2011-05-13 ENCOUNTER — Telehealth: Payer: Self-pay | Admitting: Family Medicine

## 2011-05-13 NOTE — Telephone Encounter (Signed)
Pt called and was in for ov on 10/9 to see pcp re: cough. Pt said that she was [facility], but pt doesn't have ov until 05/28/11. Pt said that she is still coughing occasionally and her nasal passage are extremely blocked up and also her ears. Pt is wondering if there is anything Dr Tawanna Cooler can prescribe or recommend. [facility]

## 2011-05-14 NOTE — Telephone Encounter (Signed)
Pt would like rachel to return her call today at [phone]

## 2011-05-14 NOTE — Telephone Encounter (Signed)
R.Marland Kitchen..........I  called her............. she is going to take a short course of prednisone to help with the allergy symptoms.

## 2011-07-14 ENCOUNTER — Telehealth: Payer: Self-pay | Admitting: Family Medicine

## 2011-07-14 NOTE — Telephone Encounter (Signed)
Triage Record Num: [id number] Operator: Migdalia Dk Patient Name: Grace [name] Call Date & Time: 07/13/2011 11:37:21PM Patient Phone: [phone] PCP: Eugenio Hoes. Todd Patient Gender: Female PCP Fax : [phone] Patient DOB: [birth year] [facility] Reason for Call: Caller: Lloyd/Spouse; PCP: Roderick Pee.; CB#: [phone]; Call regarding Cough/Congestion; Fever, chills, nasal congestion, sore throat. "I ran out to [employer] and Ijust gave her a Mucinex that the pharmacist just recommended." Tympanic 102.6 now, responsive. Care advice given per Sore Throat or Hoarseness Guideline. To call office in a.m. for appt. Protocol(s) Used: Sore Throat or Hoarseness Recommended Outcome per Protocol: See Provider within 24 hours Reason for Outcome: Temperature of 101.5 F(38.6 C) or greater Care Advice: ~ Use a cool mist humidifier to moisten air. Be sure to clean according to manufacturer's instructions. Most adults need to drink 6-10 eight-ounce glasses (1.2-2.0 liters) of fluids per day unless previously told to limit fluid intake for other medical reasons. Limit fluids that contain caffeine, sugar or alcohol. Urine will be a very light yellow color when you drink enough fluids. ~ Sore Throat Relief: - Use warm salt water gargles 3 to 4 times/day, as needed (1/2 tsp. salt in 8 oz. [.2 liters] water). - Suck on hard candy, nonprescription or herbal throat lozenges (sugar-free if diabetic) - Eat soothing, soft food/fluids (broths, soups, or honey and lemon juice in hot tea, Popsicles, frozen yogurt or sherbet, scrambled eggs, cooked cereals, Jell-O or puddings) whichever is most comforting. - Avoid eating salty, spicy or acidic foods. ~ To help relieve nasal congestion, use nonprescription saline nasal spray according to label instructions or as directed by a healthcare provider. ~ Analgesic/Antipyretic Advice - NSAIDs: Consider aspirin, ibuprofen, naproxen or  ketoprofen for pain or fever as directed on label or by pharmacist/provider. PRECAUTIONS: - If over 53 years of age, should not take longer than 1 week without consulting provider. EXCEPTIONS: - Should not be used if taking blood thinners or have bleeding problems. - Do not use if have history of sensitivity/allergy to any of these medications; or history of cardiovascular, ulcer, kidney, liver disease or diabetes unless approved by provider. - Do not exceed recommended dose or frequency. ~

## 2011-08-04 ENCOUNTER — Other Ambulatory Visit: Payer: Self-pay | Admitting: Family Medicine

## 2011-08-04 DIAGNOSIS — Z1231 Encounter for screening mammogram for malignant neoplasm of breast: Secondary | ICD-10-CM

## 2011-08-19 ENCOUNTER — Other Ambulatory Visit: Payer: Self-pay | Admitting: Family Medicine

## 2011-08-20 ENCOUNTER — Ambulatory Visit
Admission: RE | Admit: 2011-08-20 | Discharge: 2011-08-20 | Disposition: A | Discharge status: Home / Self Care | Payer: 59 | Source: Ambulatory Visit | Attending: Family Medicine | Admitting: Family Medicine

## 2011-08-20 DIAGNOSIS — Z1231 Encounter for screening mammogram for malignant neoplasm of breast: Secondary | ICD-10-CM

## 2011-09-21 ENCOUNTER — Ambulatory Visit (INDEPENDENT_AMBULATORY_CARE_PROVIDER_SITE_OTHER)
Admission: RE | Admit: 2011-09-21 | Discharge: 2011-09-21 | Disposition: A | Discharge status: Home / Self Care | Payer: 59 | Source: Ambulatory Visit | Attending: Family Medicine | Admitting: Family Medicine

## 2011-09-21 ENCOUNTER — Ambulatory Visit (INDEPENDENT_AMBULATORY_CARE_PROVIDER_SITE_OTHER): Payer: 59 | Admitting: Family Medicine

## 2011-09-21 ENCOUNTER — Encounter: Payer: Self-pay | Admitting: Family Medicine

## 2011-09-21 VITALS — BP 110/76 | Temp 98.9°F | Wt 116.0 lb

## 2011-09-21 DIAGNOSIS — J309 Allergic rhinitis, unspecified: Secondary | ICD-10-CM

## 2011-09-21 DIAGNOSIS — J45909 Unspecified asthma, uncomplicated: Secondary | ICD-10-CM

## 2011-09-21 MED ORDER — PREDNISONE 20 MG PO TABS: ORAL_TABLET | ORAL | Status: DC

## 2011-09-21 NOTE — Progress Notes (Signed)
  Subjective:    Patient ID: SABREA SANKEY, female    DOB: Dec 06, 1965, 46 y.o.   MRN: [id number]  HPI Areil is a 46 year old married female nonsmoker who comes in today for evaluation of head congestion postnasal drip sinus pressure and cough for 4 months  This is the worst year of allergies it she's ever had. We sent her to see Dr. Willa Rough.. She had a complete evaluation which showed only mild sensitivity to mold. She was advised to treat herself symptomatically.   Review of Systems    general ENT pulmonary review of systems otherwise negative Objective:   Physical Exam Well-developed well-nourished female in no acute distress HEENT negative except the nasal septum was in the midline she had marked edema of the turbinates no septal deviation neck was normal no adenopathy lungs are clear no wheezing       Assessment & Plan:  Persistent allergic rhinitis plan reviewed environmental things to aluminate including a dog in the bedroom will get sinus x-rays to rule out sinus disease and short course of oral prednisone

## 2011-09-21 NOTE — Patient Instructions (Signed)
Go to the main office now for x-rays of her sinuses we will call you the report tomorrow  Take the prednisone as directed  No dog in your bedroom

## 2011-10-20 ENCOUNTER — Ambulatory Visit: Payer: 59 | Admitting: Family Medicine

## 2011-11-10 ENCOUNTER — Ambulatory Visit: Payer: 59 | Admitting: Family Medicine

## 2011-11-23 ENCOUNTER — Other Ambulatory Visit (INDEPENDENT_AMBULATORY_CARE_PROVIDER_SITE_OTHER): Payer: 59

## 2011-11-23 DIAGNOSIS — Z0000 Encounter for general adult medical examination without abnormal findings: Secondary | ICD-10-CM

## 2011-11-23 LAB — CBC WITH DIFFERENTIAL/PLATELET
Basophils Relative: 0.3 % (ref 0.0–3.0)
Eosinophils Absolute: 0.1 10*3/uL (ref 0.0–0.7)
Eosinophils Relative: 2.3 % (ref 0.0–5.0)
Hemoglobin: 13.9 g/dL (ref 12.0–15.0)
Lymphocytes Relative: 25.4 % (ref 12.0–46.0)
MCHC: 33.5 g/dL (ref 30.0–36.0)
Monocytes Relative: 8.4 % (ref 3.0–12.0)
Neutro Abs: 2.7 10*3/uL (ref 1.4–7.7)
Neutrophils Relative %: 63.6 % (ref 43.0–77.0)
RBC: 4.65 Mil/uL (ref 3.87–5.11)
WBC: 4.2 10*3/uL — ABNORMAL LOW (ref 4.5–10.5)

## 2011-11-23 LAB — LIPID PANEL
Cholesterol: 183 mg/dL (ref 0–200)
LDL Cholesterol: 88 mg/dL (ref 0–99)
Total CHOL/HDL Ratio: 2
VLDL: 10.4 mg/dL (ref 0.0–40.0)

## 2011-11-23 LAB — BASIC METABOLIC PANEL
CO2: 28 mEq/L (ref 19–32)
Calcium: 9.5 mg/dL (ref 8.4–10.5)
Creatinine, Ser: 0.9 mg/dL (ref 0.4–1.2)
Sodium: 139 mEq/L (ref 135–145)

## 2011-11-23 LAB — HEPATIC FUNCTION PANEL
ALT: 13 U/L (ref 0–35)
AST: 18 U/L (ref 0–37)
Alkaline Phosphatase: 34 U/L — ABNORMAL LOW (ref 39–117)
Bilirubin, Direct: 0.1 mg/dL (ref 0.0–0.3)
Total Protein: 7.2 g/dL (ref 6.0–8.3)

## 2011-11-23 LAB — POCT URINALYSIS DIPSTICK
Blood, UA: NEGATIVE
Protein, UA: NEGATIVE
Spec Grav, UA: 1.02

## 2011-12-01 ENCOUNTER — Ambulatory Visit: Payer: 59 | Admitting: Family Medicine

## 2011-12-04 ENCOUNTER — Encounter: Payer: Self-pay | Admitting: Family Medicine

## 2011-12-04 ENCOUNTER — Ambulatory Visit (INDEPENDENT_AMBULATORY_CARE_PROVIDER_SITE_OTHER): Payer: 59 | Admitting: Family Medicine

## 2011-12-04 ENCOUNTER — Other Ambulatory Visit (HOSPITAL_COMMUNITY)
Admission: RE | Admit: 2011-12-04 | Discharge: 2011-12-04 | Disposition: A | Discharge status: Home / Self Care | Payer: 59 | Source: Ambulatory Visit | Attending: Family Medicine | Admitting: Family Medicine

## 2011-12-04 VITALS — BP 110/80 | HR 72 | Temp 98.1°F | Resp 18 | Ht 62.5 in | Wt 115.0 lb

## 2011-12-04 DIAGNOSIS — Z83719 Family history of colon polyps, unspecified: Secondary | ICD-10-CM

## 2011-12-04 DIAGNOSIS — Z23 Encounter for immunization: Secondary | ICD-10-CM

## 2011-12-04 DIAGNOSIS — Z01419 Encounter for gynecological examination (general) (routine) without abnormal findings: Secondary | ICD-10-CM | POA: Insufficient documentation

## 2011-12-04 DIAGNOSIS — Z0000 Encounter for general adult medical examination without abnormal findings: Secondary | ICD-10-CM

## 2011-12-04 DIAGNOSIS — Z8371 Family history of colonic polyps: Secondary | ICD-10-CM

## 2011-12-04 DIAGNOSIS — R109 Unspecified abdominal pain: Secondary | ICD-10-CM

## 2011-12-04 NOTE — Progress Notes (Signed)
  Subjective:    Patient ID: Grace [name], female    DOB: 08/02/1965, 46 y.o.   MRN: [id number]  HPI Grace [name] is a 46 year old married female G0 P0......... 46-1/2-year-old adopted daughter......... who comes in today for general physical examination  She says she feels well has no major complaints except she's been having right upper quadrant abdominal pain for the last couple months. Sometimes related to food. It sometimes lasts for a few seconds sometimes last for a few minutes.  Also her father mother sister and brother have colon polyps and she would like to get a colonoscopy as a screening test.     Review of Systems  Constitutional: Negative.   HENT: Negative.   Eyes: Negative.   Respiratory: Negative.   Cardiovascular: Negative.   Gastrointestinal: Negative.   Genitourinary: Negative.   Musculoskeletal: Negative.   Neurological: Negative.   Hematological: Negative.   Psychiatric/Behavioral: Negative.        Objective:   Physical Exam  Constitutional: She appears well-developed and well-nourished.  HENT:  Head: Normocephalic and atraumatic.  Right Ear: External ear normal.  Left Ear: External ear normal.  Nose: Nose normal.  Mouth/Throat: Oropharynx is clear and moist.  Eyes: EOM are normal. Pupils are equal, round, and reactive to light.  Neck: Normal range of motion. Neck supple. No thyromegaly present.  Cardiovascular: Normal rate, regular rhythm, normal heart sounds and intact distal pulses.  Exam reveals no gallop and no friction rub.   No murmur heard. Pulmonary/Chest: Effort normal and breath sounds normal.  Abdominal: Soft. Bowel sounds are normal. She exhibits no distension and no mass. There is no tenderness. There is no rebound.  Genitourinary: Vagina normal and uterus normal. Guaiac negative stool. No vaginal discharge found.  Musculoskeletal: Normal range of motion.  Lymphadenopathy:    She has no cervical adenopathy.  Neurological: She is alert. She has  normal reflexes. No cranial nerve deficit. She exhibits normal muscle tone. Coordination normal.  Skin: Skin is warm and dry.       Total body skin exam normal except for a dark lesion right scapular area advised to return for removal  Psychiatric: She has a normal mood and affect. Her behavior is normal. Judgment and thought content normal.          Assessment & Plan:  Healthy female  Right upper quadrant abdominal pain plan fat-free diet gallbladder ultrasound will her gallbladder disease  Reflux esophagitis continue Prevacid 15 mg daily  Hypertension continue hydrochlorothiazide 25 mg daily  Allergic rhinitis continue Flonase when necessary  Abnormal mole on her back return for removal

## 2011-12-04 NOTE — Patient Instructions (Signed)
Stay on a complete fat-free diet  We will get you set up next Thursday morning for gallbladder ultrasound  Followup with me the following day we will also remove the mole for your back

## 2011-12-10 ENCOUNTER — Ambulatory Visit
Admission: RE | Admit: 2011-12-10 | Discharge: 2011-12-10 | Disposition: A | Discharge status: Home / Self Care | Payer: 59 | Source: Ambulatory Visit | Attending: Family Medicine | Admitting: Family Medicine

## 2011-12-10 DIAGNOSIS — R109 Unspecified abdominal pain: Secondary | ICD-10-CM

## 2011-12-11 ENCOUNTER — Ambulatory Visit: Payer: 59 | Admitting: Family Medicine

## 2011-12-14 ENCOUNTER — Ambulatory Visit (INDEPENDENT_AMBULATORY_CARE_PROVIDER_SITE_OTHER): Payer: 59 | Admitting: Family Medicine

## 2011-12-14 ENCOUNTER — Encounter: Payer: Self-pay | Admitting: Family Medicine

## 2011-12-14 DIAGNOSIS — D235 Other benign neoplasm of skin of trunk: Secondary | ICD-10-CM | POA: Insufficient documentation

## 2011-12-14 DIAGNOSIS — L989 Disorder of the skin and subcutaneous tissue, unspecified: Secondary | ICD-10-CM

## 2011-12-14 DIAGNOSIS — R141 Gas pain: Secondary | ICD-10-CM

## 2011-12-14 NOTE — Patient Instructions (Signed)
Continue a fat-free diet  We will get you set for a scan of your gallbladder  We will call you within 2 weeks with a report on the molar a move from your back

## 2011-12-14 NOTE — Progress Notes (Signed)
  Subjective:    Patient ID: Grace [name], female    DOB: 08-02-1965, 46 y.o.   MRN: [id number]  HPI Amarisa is a 46 year old female who comes in today for evaluation of 2 problems  We saw her last week for a physical examination at that time she was complaining of abdominal bloating and right upper quadrant abdominal pain after eating. We placed her on a fat-free diet and get an ultrasound of her gallbladder gallbladder ultrasound normal since she's been on a fat-free diet her symptoms have abated. Because of a strong history of possible gallbladder disease a HIDA scan will be ordered  We also noted a black lesion on her back and she's here today for removal   Review of Systems    general GI and dermatologic review of systems otherwise negative Objective:   Physical Exam Abdominal exam negative no right upper quadrant tenderness to palpation  After informed consent the lesion,,,,,,,,,,, 8 mm x8 mm on her back 1 inch to the right of L3,,,,,,,, was anesthetized with 1% Xylocaine with epinephrine. The lesion was excised with 3 mm margins base was cauterized and dry sterile dressing was applied the mole was sent for pathologic analysis clinically appears to be a dysplastic nevus       Assessment & Plan:  Right upper quadrant abdominal pain and bloating PC normal ultrasound,,,,,,,,,, schedule HIDA scan  Dysplastic nevus on the back removed Path pending

## 2011-12-18 ENCOUNTER — Encounter (HOSPITAL_COMMUNITY)
Admission: RE | Admit: 2011-12-18 | Discharge: 2011-12-18 | Disposition: A | Discharge status: Home / Self Care | Payer: 59 | Source: Ambulatory Visit | Attending: Family Medicine | Admitting: Family Medicine

## 2011-12-18 DIAGNOSIS — R143 Flatulence: Secondary | ICD-10-CM | POA: Insufficient documentation

## 2011-12-18 DIAGNOSIS — R141 Gas pain: Secondary | ICD-10-CM | POA: Insufficient documentation

## 2011-12-18 DIAGNOSIS — R142 Eructation: Secondary | ICD-10-CM | POA: Insufficient documentation

## 2011-12-18 MED ORDER — TECHNETIUM TC 99M MEBROFENIN IV KIT
5.0000 | PACK | Freq: Once | INTRAVENOUS | Status: AC | PRN
  Administered 2011-12-18: 5 via INTRAVENOUS

## 2011-12-18 MED ORDER — SINCALIDE 5 MCG IJ SOLR
INTRAMUSCULAR | Status: AC
  Filled 2011-12-18: qty 5

## 2011-12-18 MED ORDER — SINCALIDE 5 MCG IJ SOLR
0.0200 ug/kg | Freq: Once | INTRAMUSCULAR | Status: AC
  Administered 2011-12-18: 5 ug via INTRAVENOUS

## 2011-12-23 ENCOUNTER — Other Ambulatory Visit: Payer: Self-pay | Admitting: Family Medicine

## 2011-12-23 DIAGNOSIS — R143 Flatulence: Secondary | ICD-10-CM

## 2011-12-23 DIAGNOSIS — R141 Gas pain: Secondary | ICD-10-CM

## 2011-12-31 ENCOUNTER — Encounter: Payer: Self-pay | Admitting: Internal Medicine

## 2012-01-21 ENCOUNTER — Encounter: Payer: Self-pay | Admitting: Gastroenterology

## 2012-01-22 ENCOUNTER — Encounter: Payer: Self-pay | Admitting: Gastroenterology

## 2012-01-22 ENCOUNTER — Telehealth: Payer: Self-pay | Admitting: *Deleted

## 2012-01-22 ENCOUNTER — Ambulatory Visit (AMBULATORY_SURGERY_CENTER): Payer: 59 | Admitting: *Deleted

## 2012-01-22 VITALS — Ht 63.0 in | Wt 116.5 lb

## 2012-01-22 DIAGNOSIS — Z8371 Family history of colonic polyps: Secondary | ICD-10-CM

## 2012-01-22 DIAGNOSIS — Z83719 Family history of colon polyps, unspecified: Secondary | ICD-10-CM

## 2012-01-22 DIAGNOSIS — Z1211 Encounter for screening for malignant neoplasm of colon: Secondary | ICD-10-CM

## 2012-01-22 MED ORDER — MOVIPREP 100 G PO SOLR: ORAL | Status: DC

## 2012-01-22 NOTE — Telephone Encounter (Signed)
Dr. Arlyce Dice:  Pt here for PV for screening colonoscopy.  She is 46 years old.  Family hx of colon polyps in mother, father, brother, and sister.  She does not know what kind of polyps they had.  She also has had some issues with RUQ abdominal pain; followed by Dr. Tawanna Cooler. She had several tests for this; results are in EPIC.  She has not had any abdominal pain for 1 month.   Do you agree that she needs screening colonoscopy now and is she okay for direct? Thanks, Olegario Messier

## 2012-01-22 NOTE — Telephone Encounter (Signed)
Ok for direct referral

## 2012-01-22 NOTE — Telephone Encounter (Signed)
Left message on pt's answering machine to proceed with colonoscopy as planned.

## 2012-02-05 ENCOUNTER — Encounter: Payer: 59 | Admitting: Internal Medicine

## 2012-02-08 ENCOUNTER — Telehealth: Payer: Self-pay | Admitting: Family Medicine

## 2012-02-08 NOTE — Telephone Encounter (Signed)
[facility] Triage Call Report Triage Record Num: [id number] Operator: Maryfrances Bunnell Patient Name: Grace [name] Call Date & Time: 02/07/2012 10:26:41AM Patient Phone: [phone] PCP: Eugenio Hoes. Todd Patient Gender: Female PCP Fax : [phone] Patient DOB: 1966/03/02 [facility] Reason for Call: Caller: Buddy/Spouse; PCP: Roderick Pee.; CB#: [phone]; LMP end of June 2013. Call regarding Nausea and vomiting; Onset 02/06/12 pm after dinner. Last time patient vomited was this morning at 1000. Afebrile. Loose stool x 1 7/13 pm. Per Nausea or Vomiting Protocol all emergent symptoms ruled out with exception of "New onset of 3-4 episodes vomiting or diarrhea following mild abdominal cramping." Home care advice given. Per s/o's Zofran 4-8mg  po q 6-8hr prn # 6 per oncall Dr. Dayton Martes called to [facility] [phone]. Protocol(s) Used: Nausea or Vomiting Recommended Outcome per Protocol: Provide Home/Self Care Reason for Outcome: New onset of 3-4 episodes vomiting or diarrhea following mild abdominal cramping Care Advice: ~ Call provider if symptoms do not improve after 24 hours of home care. ~ SYMPTOM / CONDITION MANAGEMENT Vomiting and Diarrhea Care: - Do not eat solid foods until vomiting subsides. - Begin taking fluids by sucking on ice chips or popsicles or taking sips of cool, clear fluids (soda, fruit juices that are low acid, sports drinks or nonprescription oral rehydration solution). - Gradually drink larger amounts of these fluids so that you are drinking six to eight 8 oz. (1.2 to 1.6 liters) of fluids a day. - Keep activity to a minimum. - Once vomiting and diarrhea subside, eat smaller, more frequent meals of easily digested foods such as crackers, toast, bananas, rice, cooked cereal, applesauce, broth, baked or mashed potatoes, chicken or [place] without skin. Eat slowly. - Take fluids 30 minutes before or 60 minutes after meals. - Avoid high fat,  highly seasoned, high fiber or high sugar content foods. - Avoid extremely hot or cold foods. - Do not take pain medication (such as aspirin, NSAIDs) while nauseated or vomiting. - Do not drink caffeinated or alcoholic beverages. ~ 07/

## 2012-03-04 ENCOUNTER — Ambulatory Visit (AMBULATORY_SURGERY_CENTER): Payer: 59 | Admitting: Gastroenterology

## 2012-03-04 ENCOUNTER — Encounter: Payer: Self-pay | Admitting: Gastroenterology

## 2012-03-04 VITALS — BP 122/76 | HR 71 | Temp 98.4°F | Resp 18 | Ht 63.0 in | Wt 116.0 lb

## 2012-03-04 DIAGNOSIS — Z1211 Encounter for screening for malignant neoplasm of colon: Secondary | ICD-10-CM

## 2012-03-04 DIAGNOSIS — K648 Other hemorrhoids: Secondary | ICD-10-CM

## 2012-03-04 DIAGNOSIS — Z8371 Family history of colonic polyps: Secondary | ICD-10-CM

## 2012-03-04 MED ORDER — SODIUM CHLORIDE 0.9 % IV SOLN: 500.0000 mL | INTRAVENOUS | Status: DC

## 2012-03-04 NOTE — Op Note (Signed)
[facility] [address] [place], [place]  [address]  COLONOSCOPY PROCEDURE REPORT  PATIENT:  Grace [name], Grace [name]  MR#:  [id number] BIRTHDATE:  1966-05-13, 45 yrs. old  GENDER:  female ENDOSCOPIST:  Barbette Hair. Arlyce Dice, MD REF. BY:  Tinnie Gens A. Tawanna Cooler, M.D. PROCEDURE DATE:  03/04/2012 PROCEDURE:  Diagnostic Colonoscopy ASA CLASS:  Class I INDICATIONS:  Screening, family Hx of polyps MEDICATIONS:   MAC sedation, administered by CRNA mg IV propofol 290  DESCRIPTION OF PROCEDURE:   After the risks benefits and alternatives of the procedure were thoroughly explained, informed consent was obtained.  Digital rectal exam was performed and revealed external hemorrhoids.   The LB [id number] endoscope was introduced through the anus and advanced to the cecum, which was identified by both the appendix and ileocecal valve, without limitations.  The quality of the prep was excellent, using MoviPrep.  The instrument was then slowly withdrawn as the colon was fully examined. <<PROCEDUREIMAGES>>  FINDINGS:  Internal and external Hemorrhoids were found (see image3).  This was otherwise a normal examination of the colon (see image1 and image2).   Retroflexed views in the rectum revealed no abnormalities.    The time to cecum =  1) 2.75 minutes. The scope was then withdrawn in  1) 7.0  minutes from the cecum and the procedure completed. COMPLICATIONS:  None ENDOSCOPIC IMPRESSION: 1) Internal and external hemorrhoids 2) Otherwise normal examination RECOMMENDATIONS: 1) Continue current colorectal screening recommendations for "routine risk" patients with a repeat colonoscopy in 10 years. REPEAT EXAM:  In 10 year(s) for Colonoscopy.  ______________________________ Barbette Hair. Arlyce Dice, MD  CC:  n. eSIGNED:   Barbette Hair. Kaplan at 03/04/2012 02:40 PM  [place], [place], [id number]

## 2012-03-04 NOTE — Patient Instructions (Signed)
Hemorrhoids seen today, see handout. Repeat colonoscopy in 10 years. Thank you!!  YOU HAD AN ENDOSCOPIC PROCEDURE TODAY AT THE [facility]: Refer to the procedure report that was given to you for any specific questions about what was found during the examination.  If the procedure report does not answer your questions, please call your gastroenterologist to clarify.  If you requested that your care partner not be given the details of your procedure findings, then the procedure report has been included in a sealed envelope for you to review at your convenience later.  YOU SHOULD EXPECT: Some feelings of bloating in the abdomen. Passage of more gas than usual.  Walking can help get rid of the air that was put into your GI tract during the procedure and reduce the bloating. If you had a lower endoscopy (such as a colonoscopy or flexible sigmoidoscopy) you may notice spotting of blood in your stool or on the toilet paper. If you underwent a bowel prep for your procedure, then you may not have a normal bowel movement for a few days.  DIET: Your first meal following the procedure should be a light meal and then it is ok to progress to your normal diet.  A half-sandwich or bowl of soup is an example of a good first meal.  Heavy or fried foods are harder to digest and may make you feel nauseous or bloated.  Likewise meals heavy in dairy and vegetables can cause extra gas to form and this can also increase the bloating.  Drink plenty of fluids but you should avoid alcoholic beverages for 24 hours.  ACTIVITY: Your care partner should take you home directly after the procedure.  You should plan to take it easy, moving slowly for the rest of the day.  You can resume normal activity the day after the procedure however you should NOT DRIVE or use heavy machinery for 24 hours (because of the sedation medicines used during the test).    SYMPTOMS TO REPORT IMMEDIATELY: A gastroenterologist can be reached at  any hour.  During normal business hours, 8:30 AM to 5:00 PM Monday through Friday, call [phone].  After hours and on weekends, please call the GI answering service at [phone] who will take a message and have the physician on call contact you.   Following lower endoscopy (colonoscopy or flexible sigmoidoscopy):  Excessive amounts of blood in the stool  Significant tenderness or worsening of abdominal pains  Swelling of the abdomen that is new, acute  Fever of 100F or higher  FOLLOW UP: If any biopsies were taken you will be contacted by phone or by letter within the next 1-3 weeks.  Call your gastroenterologist if you have not heard about the biopsies in 3 weeks.  Our staff will call the home number listed on your records the next business day following your procedure to check on you and address any questions or concerns that you may have at that time regarding the information given to you following your procedure. This is a courtesy call and so if there is no answer at the home number and we have not heard from you through the emergency physician on call, we will assume that you have returned to your regular daily activities without incident.  SIGNATURES/CONFIDENTIALITY: You and/or your care partner have signed paperwork which will be entered into your electronic medical record.  These signatures attest to the fact that that the information above on your After Visit Summary has  been reviewed and is understood.  Full responsibility of the confidentiality of this discharge information lies with you and/or your care-partner.  

## 2012-03-04 NOTE — Progress Notes (Signed)
Patient did not experience any of the following events: a burn prior to discharge; a fall within the facility; wrong site/side/patient/procedure/implant event; or a hospital transfer or hospital admission upon discharge from the facility. (G8907) Patient did not have preoperative order for IV antibiotic SSI prophylaxis. (G8918)  

## 2012-03-07 ENCOUNTER — Telehealth: Payer: Self-pay | Admitting: *Deleted

## 2012-03-07 NOTE — Telephone Encounter (Signed)
  Follow up Call-  Call back number 03/04/2012  Post procedure Call Back phone  # [phone]  Permission to leave phone message Yes     Eft message on answering machine to give [place] a call back if she is experiencing any problems or has questions

## 2012-07-26 ENCOUNTER — Other Ambulatory Visit: Payer: Self-pay | Admitting: Family Medicine

## 2012-07-26 DIAGNOSIS — Z1231 Encounter for screening mammogram for malignant neoplasm of breast: Secondary | ICD-10-CM

## 2012-08-29 ENCOUNTER — Ambulatory Visit
Admission: RE | Admit: 2012-08-29 | Discharge: 2012-08-29 | Disposition: A | Discharge status: Home / Self Care | Payer: 59 | Source: Ambulatory Visit | Attending: Family Medicine | Admitting: Family Medicine

## 2012-08-29 DIAGNOSIS — Z1231 Encounter for screening mammogram for malignant neoplasm of breast: Secondary | ICD-10-CM

## 2012-11-22 ENCOUNTER — Ambulatory Visit (INDEPENDENT_AMBULATORY_CARE_PROVIDER_SITE_OTHER): Payer: 59 | Admitting: Family Medicine

## 2012-11-22 ENCOUNTER — Encounter: Payer: Self-pay | Admitting: Family Medicine

## 2012-11-22 ENCOUNTER — Telehealth: Payer: Self-pay | Admitting: Family Medicine

## 2012-11-22 VITALS — BP 110/70 | Temp 98.3°F | Wt 121.0 lb

## 2012-11-22 DIAGNOSIS — L97429 Non-pressure chronic ulcer of left heel and midfoot with unspecified severity: Secondary | ICD-10-CM | POA: Insufficient documentation

## 2012-11-22 DIAGNOSIS — L97919 Non-pressure chronic ulcer of unspecified part of right lower leg with unspecified severity: Secondary | ICD-10-CM

## 2012-11-22 DIAGNOSIS — I83221 Varicose veins of left lower extremity with both ulcer of thigh and inflammation: Secondary | ICD-10-CM

## 2012-11-22 DIAGNOSIS — I83892 Varicose veins of left lower extremities with other complications: Secondary | ICD-10-CM | POA: Insufficient documentation

## 2012-11-22 DIAGNOSIS — I83219 Varicose veins of right lower extremity with both ulcer of unspecified site and inflammation: Secondary | ICD-10-CM

## 2012-11-22 DIAGNOSIS — I83224 Varicose veins of left lower extremity with both ulcer of heel and midfoot and inflammation: Secondary | ICD-10-CM

## 2012-11-22 DIAGNOSIS — I83228 Varicose veins of left lower extremity with both ulcer of other part of lower extremity and inflammation: Secondary | ICD-10-CM

## 2012-11-22 NOTE — Telephone Encounter (Signed)
Dr Candie Chroman office needs referral for her to see them from [facility].

## 2012-11-22 NOTE — Telephone Encounter (Signed)
Referral request sent 

## 2012-11-22 NOTE — Patient Instructions (Signed)
Call Dr. Hart Rochester,,,,,,,,,,,, vascular surgeon,,,,,,,,,,,,, for consult concerning her left leg  Set up a time the first week in May for your annual checkup

## 2012-11-22 NOTE — Progress Notes (Signed)
  Subjective:    Patient ID: Grace [name], female    DOB: Mar 23, 1966, 47 y.o.   MRN: [id number]  HPI Grace [name] is a 47 year old married female nonsmoker [id number],,,,,,,,,, adopted child,,,,,,,, who comes in today for evaluation of pain in her left calf  She's had pain in her left calf for about a year. She's noticed a very large varicose vein. No history of trauma. Her weight is good 121 pounds   Review of Systems    review of systems otherwise negative Objective:   Physical Exam Well-developed well nourished female no acute distress examination of both lower extremity shows legs to both to appear normal, pulse is normal, she has multiple small spider veins and a very large Virchow strain left posterior calf       Assessment & Plan:  Painful varicose vein left lower extremity refer to Dr. Hart Rochester for consult

## 2012-11-24 ENCOUNTER — Other Ambulatory Visit: Payer: Self-pay | Admitting: *Deleted

## 2012-11-24 DIAGNOSIS — I83893 Varicose veins of bilateral lower extremities with other complications: Secondary | ICD-10-CM

## 2012-11-27 ENCOUNTER — Other Ambulatory Visit: Payer: Self-pay | Admitting: Family Medicine

## 2012-11-30 ENCOUNTER — Telehealth: Payer: Self-pay | Admitting: Family Medicine

## 2012-11-30 NOTE — Telephone Encounter (Signed)
Pt called in to reschedule her CPX in may. I explained that Dr. Tawanna Cooler didn't have anything until July, but she stated that Dr. Tawanna Cooler requested that she been seen in May. Please let me know where else I can schedule this CPX. Thank you!

## 2012-12-01 NOTE — Telephone Encounter (Signed)
Okay to work in

## 2012-12-06 ENCOUNTER — Encounter: Payer: 59 | Admitting: Family Medicine

## 2012-12-13 ENCOUNTER — Ambulatory Visit (INDEPENDENT_AMBULATORY_CARE_PROVIDER_SITE_OTHER): Payer: 59 | Admitting: Family Medicine

## 2012-12-13 ENCOUNTER — Encounter: Payer: Self-pay | Admitting: Family Medicine

## 2012-12-13 ENCOUNTER — Other Ambulatory Visit (HOSPITAL_COMMUNITY)
Admission: RE | Admit: 2012-12-13 | Discharge: 2012-12-13 | Disposition: A | Discharge status: Home / Self Care | Payer: 59 | Source: Ambulatory Visit | Attending: Family Medicine | Admitting: Family Medicine

## 2012-12-13 VITALS — BP 110/80 | Temp 98.4°F | Ht 64.5 in | Wt 120.0 lb

## 2012-12-13 DIAGNOSIS — Z0000 Encounter for general adult medical examination without abnormal findings: Secondary | ICD-10-CM

## 2012-12-13 DIAGNOSIS — I83224 Varicose veins of left lower extremity with both ulcer of heel and midfoot and inflammation: Secondary | ICD-10-CM

## 2012-12-13 DIAGNOSIS — N6019 Diffuse cystic mastopathy of unspecified breast: Secondary | ICD-10-CM

## 2012-12-13 DIAGNOSIS — I83219 Varicose veins of right lower extremity with both ulcer of unspecified site and inflammation: Secondary | ICD-10-CM

## 2012-12-13 DIAGNOSIS — N6011 Diffuse cystic mastopathy of right breast: Secondary | ICD-10-CM

## 2012-12-13 DIAGNOSIS — I10 Essential (primary) hypertension: Secondary | ICD-10-CM

## 2012-12-13 DIAGNOSIS — Z01419 Encounter for gynecological examination (general) (routine) without abnormal findings: Secondary | ICD-10-CM | POA: Insufficient documentation

## 2012-12-13 DIAGNOSIS — L97919 Non-pressure chronic ulcer of unspecified part of right lower leg with unspecified severity: Secondary | ICD-10-CM

## 2012-12-13 DIAGNOSIS — J309 Allergic rhinitis, unspecified: Secondary | ICD-10-CM

## 2012-12-13 NOTE — Progress Notes (Signed)
  Subjective:    Patient ID: Grace [name], female    DOB: 10/12/1965, 47 y.o.   MRN: [id number]  HPIBeth is a delightful 47 year old married female nonsmoker who comes in today for general physical exam  She takes Hydrocort thiazide 25 mg daily for mild hypertension BP 110/80  She gets routine eye care...........Marland Kitchen Referred to Dr. Gweneth Dimitri,,,,,,,, regular dental care, BSE monthly, and you mammography, tetanus booster 2013  She does have a history of varicose veins and is due to see Dr. Hart Rochester soon for definitive therapy  She does have a cystic lesion in the right breast at 6:00. This has been noticed previously. It's not changed. Recent mammogram normal  LMP was 5/4 normal.........she does not use any birth control she's not able to get pregnant. She anybody have a 47-year-old daughter who is doing well.  Feiga continues to work full-time    Review of Systems  Constitutional: Negative.   HENT: Negative.   Eyes: Negative.   Respiratory: Negative.   Cardiovascular: Negative.   Gastrointestinal: Negative.   Genitourinary: Negative.   Musculoskeletal: Negative.   Neurological: Negative.   Psychiatric/Behavioral: Negative.        Objective:   Physical Exam  Constitutional: She appears well-developed and well-nourished.  HENT:  Head: Normocephalic and atraumatic.  Right Ear: External ear normal.  Left Ear: External ear normal.  Nose: Nose normal.  Mouth/Throat: Oropharynx is clear and moist.  Eyes: EOM are normal. Pupils are equal, round, and reactive to light.  Neck: Normal range of motion. Neck supple. No thyromegaly present.  Cardiovascular: Normal rate, regular rhythm, normal heart sounds and intact distal pulses.  Exam reveals no gallop and no friction rub.   No murmur heard. Pulmonary/Chest: Effort normal and breath sounds normal.  Abdominal: Soft. Bowel sounds are normal. She exhibits no distension and no mass. There is no tenderness. There is no rebound.  Genitourinary:  Vagina normal and uterus normal. Guaiac negative stool. No vaginal discharge found.  Bilateral breast exam normal except for a cystic lesion right breast at 6:00 an inch below the nipple  The lesion is a pea-sized lesion soft to rubbery movable has been noticed previously on previous exams  Musculoskeletal: Normal range of motion.  Lymphadenopathy:    She has no cervical adenopathy.  Neurological: She is alert. She has normal reflexes. No cranial nerve deficit. She exhibits normal muscle tone. Coordination normal.  Skin: Skin is warm and dry.  Total body skin exam normal except for the scars from previous lesions we removed which were dysplastic  Psychiatric: She has a normal mood and affect. Her behavior is normal. Judgment and thought content normal.          Assessment & Plan:  Healthy female  Mild hypertension continue hydrochlorothiazide 25 mg daily  Cystic lesion right breast continue BSE monthly and mammography. Return if any concerns for reevaluation prior to next physical  History of dysplastic nevi,,,,,,,,,,,,, stressed sunscreens SPF 50+  Varicose veins,,,,,,,,,, definitive therapy by Dr. Hart Rochester

## 2012-12-13 NOTE — Patient Instructions (Signed)
Continue your current medications  Remember to do a thorough breast exam monthly and get your annual mammogram measure are currently doing  If you have any concerns or questions between checkup times come in and see [place] for reevaluation  Sunscreens SPF 50+  Return in one year for general physical examination sooner if any problem

## 2012-12-23 ENCOUNTER — Encounter: Payer: Self-pay | Admitting: Vascular Surgery

## 2012-12-26 ENCOUNTER — Encounter: Payer: Self-pay | Admitting: Vascular Surgery

## 2012-12-26 ENCOUNTER — Ambulatory Visit (INDEPENDENT_AMBULATORY_CARE_PROVIDER_SITE_OTHER): Payer: 59 | Admitting: Vascular Surgery

## 2012-12-26 ENCOUNTER — Encounter (INDEPENDENT_AMBULATORY_CARE_PROVIDER_SITE_OTHER): Payer: 59 | Admitting: *Deleted

## 2012-12-26 VITALS — BP 133/86 | HR 68 | Ht 64.5 in | Wt 120.0 lb

## 2012-12-26 DIAGNOSIS — I83892 Varicose veins of left lower extremities with other complications: Secondary | ICD-10-CM | POA: Insufficient documentation

## 2012-12-26 DIAGNOSIS — I83893 Varicose veins of bilateral lower extremities with other complications: Secondary | ICD-10-CM

## 2012-12-26 NOTE — Progress Notes (Signed)
Subjective:     Patient ID: Grace [name], female   DOB: November 13, 1965, 47 y.o.   MRN: [id number]  HPI this 47 year old female was referred by Dr. Alonza Smoker for evaluation of varicose veins in the left leg which have become increasingly symptomatic. She has noticed bulging in the medial calf area on the left for last 8-10 months. She has now developed aching throbbing burning discomfort in that area as well as some mild edema as the day progresses. She has no history of DVT, thrombophlebitis, stasis ulcers, or bleeding. She does not wear elastic compression stockings were elevate her legs. Symptoms have continued to worsen over the past 6-8 months.  Past Medical History  Diagnosis Date  . Allergy   . Hypertension     History  Substance Use Topics  . Smoking status: Former Smoker    Quit date: 01/21/1997  . Smokeless tobacco: Never Used  . Alcohol Use: 1.2 oz/week    2 Glasses of wine per week    Family History  Problem Relation Age of Onset  . Hypertension      family hx  . Cancer      prostate  . Colon polyps Mother   . Hypertension Mother   . Colon polyps Father   . Cancer Father   . Hypertension Father   . Colon polyps Sister 92  . Colon polyps Brother 50  . Stomach cancer Neg Hx     No Known Allergies  Current outpatient prescriptions:hydrochlorothiazide (HYDRODIURIL) 25 MG tablet, take 1 tablet by mouth every morning, Disp: 100 tablet, Rfl: 3  BP 133/86  Pulse 68  Ht 5' 4.5" (1.638 m)  Wt 120 lb (54.432 kg)  BMI 20.29 kg/m2  SpO2 100%  Body mass index is 20.29 kg/(m^2).          Review of Systems denies chest pain, dyspnea on exertion, PND, orthopnea, hemoptysis, claudication-all systems negative and complete review of system     Objective:   Physical Exam blood pressure 133/86 heart rate 68 respirations 18 Gen.-alert and oriented x3 in no apparent distress HEENT normal for age Lungs no rhonchi or wheezing Cardiovascular regular rhythm no murmurs  carotid pulses 3+ palpable no bruits audible Abdomen soft nontender no palpable masses Musculoskeletal free of  major deformities Skin clear -no rashes Neurologic normal Lower extremities 3+ femoral and dorsalis pedis pulses palpable bilaterally with no edema on the right. Left leg with bulging varicosities in the medial calf over the great saphenous system between the knee and ankle and 1+ edema distally. No hyperpigmentation or ulcerations noted.  Today I ordered a venous duplex exam of the left leg which are reviewed and interpreted. There is no DVT. He is gross reflux in the left great saphenous vein from the junction to the knee with a large vein.      Assessment:     Severe venous insufficiency left great saphenous system with bulging symptomatic varicosities in left medial calf due to gross reflux left great saphenous vein-effecting patient's daily living    Plan:     #1 long-leg elastic compression stockings 20-30 mm gradient #2 elevate legs frequently during day #3 ibuprofen on a daily basis #4 return in 3 months-if no significant improvement patient will need laser ablation left great saphenous vein with 10-20 stab phlebectomy of secondary varicosities a single procedure to relieve her symptomatology

## 2012-12-26 NOTE — Addendum Note (Signed)
Addended by: Adria Dill L on: 12/26/2012 12:16 PM   Modules accepted: Orders

## 2013-01-23 ENCOUNTER — Other Ambulatory Visit: Payer: 59

## 2013-01-30 ENCOUNTER — Encounter: Payer: 59 | Admitting: Family Medicine

## 2013-04-11 ENCOUNTER — Ambulatory Visit: Payer: 59 | Admitting: Vascular Surgery

## 2013-04-17 ENCOUNTER — Encounter: Payer: Self-pay | Admitting: Vascular Surgery

## 2013-04-18 ENCOUNTER — Encounter: Payer: Self-pay | Admitting: Vascular Surgery

## 2013-04-18 ENCOUNTER — Ambulatory Visit (INDEPENDENT_AMBULATORY_CARE_PROVIDER_SITE_OTHER): Payer: 59 | Admitting: Vascular Surgery

## 2013-04-18 VITALS — BP 127/85 | HR 69 | Resp 16 | Ht 63.0 in | Wt 120.0 lb

## 2013-04-18 DIAGNOSIS — I83893 Varicose veins of bilateral lower extremities with other complications: Secondary | ICD-10-CM

## 2013-04-18 NOTE — Progress Notes (Signed)
Subjective:     Patient ID: Grace [name], female   DOB: 1965-10-22, 47 y.o.   MRN: [id number]  HPI this 47 year old female returns for continued followup regarding her venous insufficiency of the left leg. She has painful varicosities in the left medial calf which cause aching throbbing and burning discomfort and tightness as the day progresses. She has tried long-leg elastic compression stockings 20-30 mm gradient as well as elevation and ibuprofen with no success. She has no symptoms in the contralateral right leg.  Past Medical History  Diagnosis Date  . Allergy   . Hypertension     History  Substance Use Topics  . Smoking status: Former Smoker    Quit date: 01/21/1997  . Smokeless tobacco: Never Used  . Alcohol Use: 1.2 oz/week    2 Glasses of wine per week    Family History  Problem Relation Age of Onset  . Hypertension      family hx  . Cancer      prostate  . Colon polyps Mother   . Hypertension Mother   . Colon polyps Father   . Cancer Father   . Hypertension Father   . Colon polyps Sister 9  . Colon polyps Brother 50  . Stomach cancer Neg Hx     No Known Allergies  Current outpatient prescriptions:hydrochlorothiazide (HYDRODIURIL) 25 MG tablet, take 1 tablet by mouth every morning, Disp: 100 tablet, Rfl: 3  BP 127/85  Pulse 69  Resp 16  Ht 5\' 3"  (1.6 m)  Wt 120 lb (54.432 kg)  BMI 21.26 kg/m2  Body mass index is 21.26 kg/(m^2).           Review of Systems denies chest pain, dyspnea on exertion, PND, orthopnea, hemoptysis     Objective:   Physical Exam BP 127/85  Pulse 69  Resp 16  Ht 5\' 3"  (1.6 m)  Wt 120 lb (54.432 kg)  BMI 21.26 kg/m2  General well-developed well-nourished female in no apparent stress alert and oriented x3 Left leg with bulging varicosities in the great saphenous system distribution in the distal thigh and medial calf. No hyperpigmentation or ulceration is noted in 3+ dorsalis pedis pulses palpable.      Assessment:      Venous insufficiency left leg with painful varicosities due to reflux left great saphenous system-resistant to conservative management including long-leg elastic compression stockings 20-30 mm gradient, elevation, and ibuprofen    Plan:     Patient needs a laser ablation left great saphenous vein with 10-20 stab phlebectomy of painful varicosities as single procedure Will proceed with precertification to perform this in the near

## 2013-05-12 ENCOUNTER — Other Ambulatory Visit (HOSPITAL_COMMUNITY): Payer: Self-pay

## 2013-05-15 ENCOUNTER — Other Ambulatory Visit: Payer: Self-pay | Admitting: *Deleted

## 2013-05-15 DIAGNOSIS — I83893 Varicose veins of bilateral lower extremities with other complications: Secondary | ICD-10-CM

## 2013-05-19 ENCOUNTER — Encounter: Payer: Self-pay | Admitting: Vascular Surgery

## 2013-05-22 ENCOUNTER — Ambulatory Visit (INDEPENDENT_AMBULATORY_CARE_PROVIDER_SITE_OTHER): Payer: 59 | Admitting: Vascular Surgery

## 2013-05-22 ENCOUNTER — Encounter: Payer: Self-pay | Admitting: Vascular Surgery

## 2013-05-22 ENCOUNTER — Encounter (INDEPENDENT_AMBULATORY_CARE_PROVIDER_SITE_OTHER): Payer: Self-pay

## 2013-05-22 VITALS — BP 125/83 | HR 76 | Resp 16 | Ht 63.0 in | Wt 120.0 lb

## 2013-05-22 DIAGNOSIS — I83893 Varicose veins of bilateral lower extremities with other complications: Secondary | ICD-10-CM

## 2013-05-22 NOTE — Progress Notes (Signed)
Laser Ablation Procedure      Date: 05/22/2013    Grace [name] DOB:1966-03-29  Consent signed: Yes  Surgeon:J.D. Hart Rochester  Procedure: Laser Ablation: left Greater Saphenous Vein  BP 125/83  Pulse 76  Resp 16  Ht 5\' 3"  (1.6 m)  Wt 120 lb (54.432 kg)  BMI 21.26 kg/m2  Start time: 1:00   End time: 1:45  Tumescent Anesthesia: 300 cc 0.9% NaCl with 50 cc Lidocaine HCL with 1% Epi and 15 cc 8.4% NaHCO3  Local Anesthesia: 3 cc Lidocaine HCL and NaHCO3 (ratio 2:1)  Pulsed mode 15 watts. delay. 1.0 duration Total energy: 1661, total pulses: 111, total time: 1:51     Stab Phlebectomy: 10-20 Sites: Calf  Patient tolerated procedure well: Yes  Notes:   Description of Procedure:  After marking the course of the saphenous vein and the secondary varicosities in the standing position, the patient was placed on the operating table in the supine position, and the left leg was prepped and draped in sterile fashion. Local anesthetic was administered, and under ultrasound guidance the saphenous vein was accessed with a micro needle and guide wire; then the micro puncture sheath was placed. A guide wire was inserted to the saphenofemoral junction, followed by a 5 french sheath.  The position of the sheath and then the laser fiber below the junction was confirmed using the ultrasound and visualization of the aiming beam.  Tumescent anesthesia was administered along the course of the saphenous vein using ultrasound guidance. Protective laser glasses were placed on the patient, and the laser was fired at 15 watts pulsed mode  For a total of 1661 joules.  A steri strip was applied to the puncture site.  The patient was then put into Trendelenburg position.  Local anesthetic was utilized overlying the marked varicosities.  Ten to 20 stab wounds were made using the tip of an 11 blade; and using the vein hook,  The phlebectomies were performed using a hemostat to avulse these varicosities.  Adequate  hemostasis was achieved, and steri strips were applied to the stab wound.      ABD pads and thigh high compression stockings were applied.  Ace wrap bandages were applied over the phlebectomy sites and at the top of the saphenofemoral junction.  Blood loss was less than 15 cc.  The patient ambulated out of the operating room having tolerated the procedure well.

## 2013-05-22 NOTE — Progress Notes (Signed)
Subjective:     Patient ID: Grace [name], female   DOB: [birth year], 47 y.o.   MRN: [id number]  HPI this 47 year old female had laser ablation left great saphenous vein with less than 10 stab phlebectomy of painful secondary varicosities all performed under local tumescent anesthesia. She tolerated the procedure well.  Review of Systems     Objective:   Physical Exam BP 125/83  Pulse 76  Resp 16  Ht 5\' 3"  (1.6 m)  Wt 120 lb (54.432 kg)  BMI 21.26 kg/m2       Assessment:     Well-tolerated laser ablation left great saphenous vein with multiple stab phlebectomy of painful varicosities performed under local tumescent anesthesia    Plan:     Return in one week for venous duplex exam to confirm closure left great saphenous vein

## 2013-05-23 ENCOUNTER — Other Ambulatory Visit: Payer: Self-pay | Admitting: *Deleted

## 2013-05-23 ENCOUNTER — Encounter: Payer: Self-pay | Admitting: Vascular Surgery

## 2013-05-23 DIAGNOSIS — I83221 Varicose veins of left lower extremity with both ulcer of thigh and inflammation: Secondary | ICD-10-CM

## 2013-05-23 DIAGNOSIS — I83228 Varicose veins of left lower extremity with both ulcer of other part of lower extremity and inflammation: Secondary | ICD-10-CM

## 2013-05-29 ENCOUNTER — Encounter: Payer: Self-pay | Admitting: Vascular Surgery

## 2013-05-30 ENCOUNTER — Ambulatory Visit (HOSPITAL_COMMUNITY)
Admission: RE | Admit: 2013-05-30 | Discharge: 2013-05-30 | Disposition: A | Discharge status: Home / Self Care | Payer: 59 | Source: Ambulatory Visit | Attending: Vascular Surgery | Admitting: Vascular Surgery

## 2013-05-30 ENCOUNTER — Encounter (HOSPITAL_COMMUNITY): Payer: 59

## 2013-05-30 ENCOUNTER — Ambulatory Visit (INDEPENDENT_AMBULATORY_CARE_PROVIDER_SITE_OTHER): Payer: 59 | Admitting: Vascular Surgery

## 2013-05-30 ENCOUNTER — Ambulatory Visit: Payer: 59 | Admitting: Vascular Surgery

## 2013-05-30 ENCOUNTER — Encounter: Payer: Self-pay | Admitting: Vascular Surgery

## 2013-05-30 VITALS — BP 131/82 | HR 73 | Resp 16 | Ht 63.0 in | Wt 120.0 lb

## 2013-05-30 DIAGNOSIS — I83893 Varicose veins of bilateral lower extremities with other complications: Secondary | ICD-10-CM

## 2013-05-30 NOTE — Progress Notes (Signed)
Subjective:     Patient ID: Grace [name], female   DOB: 07-07-1966, 47 y.o.   MRN: [id number]  HPI this 47 year old female returns 1 week post laser ablation left great saphenous vein with multiple stab phlebectomy of painful varicosities. She tolerated the procedure well last week. She has had some mild discomfort in the left medial thigh but no distal edema or pain at the stab phlebectomy sites. She denies any chest pain dyspnea on exertion or hemoptysis.  Review of Systems     Objective:   Physical Exam BP 131/82  Pulse 73  Resp 16  Ht 5\' 3"  (1.6 m)  Wt 120 lb (54.432 kg)  BMI 21.26 kg/m2  General well-developed well-nourished female no apparent stress alert and oriented x3 Lungs no rhonchi wheezing Left leg with some ecchymosis in the medial proximal thigh with mild tenderness to palpation. Stab phlebectomy sites in the medial calf are healing nicely with no tenderness. No distal edema. 2+ posterior tibial pulse palpable.  Today I ordered a venous duplex exam left leg which are reviewed and interpreted. There is no DVT. Left great saphenous vein is closed from the knee to within 1.3 cm from the saphenofemoral junction     Assessment:    successful laser ablation left great saphenous vein with multiple stab phlebectomy for painful varicosities    Plan:     Return to see [place] on when necessary basis

## 2013-08-01 ENCOUNTER — Other Ambulatory Visit: Payer: Self-pay

## 2013-08-01 DIAGNOSIS — Z1231 Encounter for screening mammogram for malignant neoplasm of breast: Secondary | ICD-10-CM

## 2013-09-04 ENCOUNTER — Ambulatory Visit: Payer: 59

## 2013-09-18 ENCOUNTER — Ambulatory Visit: Payer: 59

## 2013-10-03 ENCOUNTER — Encounter: Payer: Self-pay | Admitting: Family Medicine

## 2013-10-03 ENCOUNTER — Ambulatory Visit (INDEPENDENT_AMBULATORY_CARE_PROVIDER_SITE_OTHER): Payer: 59 | Admitting: Family Medicine

## 2013-10-03 VITALS — BP 110/80 | Temp 98.6°F | Wt 122.0 lb

## 2013-10-03 DIAGNOSIS — M25559 Pain in unspecified hip: Secondary | ICD-10-CM

## 2013-10-03 DIAGNOSIS — N949 Unspecified condition associated with female genital organs and menstrual cycle: Secondary | ICD-10-CM

## 2013-10-03 DIAGNOSIS — N938 Other specified abnormal uterine and vaginal bleeding: Secondary | ICD-10-CM | POA: Insufficient documentation

## 2013-10-03 DIAGNOSIS — N925 Other specified irregular menstruation: Secondary | ICD-10-CM

## 2013-10-03 DIAGNOSIS — M25551 Pain in right hip: Secondary | ICD-10-CM | POA: Insufficient documentation

## 2013-10-03 NOTE — Progress Notes (Signed)
Pre visit review using our clinic review tool, if applicable. No additional management support is needed unless otherwise documented below in the visit note. 

## 2013-10-03 NOTE — Progress Notes (Signed)
   Subjective:    Patient ID: MELEENA MUNROE, female    DOB: [birth year], 48 y.o.   MRN: [id number]  HPI Tommi is a 48 year old married female G0 P0........ adopted daughter....... who comes in today for evaluation of 2 problems  For the past 3-4 months she's had discomfort in her right hip. It sometimes hurts when she sits. It comes and goes. It may last for a few minutes and occur 3 or 4 times a day. It's decreased by hitting heat and increased by sitting. She has a history of trauma. Family history negative  She's having heavy periods sometimes the last 10 days to 2 weeks last period was 5 days normal.   Review of Systems    review of systems negative Objective:   Physical Exam  Well-developed well-nourished female no acute distress vital signs stable she is afebrile examination of left hip is normal examination right hip shows decreased range of motion externally she's lacking about 15-20 compared to the left and it hurts when I tried externally rotate her hip      Assessment & Plan:  Right hip pain plan x-ray  Dysfunction uterine bleeding discussed options

## 2013-10-03 NOTE — Patient Instructions (Signed)
Go to the main office ASAP for x-rays of your right hip  I will call you the report  In the meantime take Aleve,,,,,,,,,,,,,,, one twice daily,,,,,,,,,,,,,,,, or Motrin 400 mg twice daily  Consult with GYN about the DU B.

## 2013-10-05 ENCOUNTER — Ambulatory Visit (INDEPENDENT_AMBULATORY_CARE_PROVIDER_SITE_OTHER)
Admission: RE | Admit: 2013-10-05 | Discharge: 2013-10-05 | Disposition: A | Discharge status: Home / Self Care | Payer: 59 | Source: Ambulatory Visit | Attending: Family Medicine | Admitting: Family Medicine

## 2013-10-05 ENCOUNTER — Other Ambulatory Visit: Payer: Self-pay | Admitting: Family Medicine

## 2013-10-05 DIAGNOSIS — M25551 Pain in right hip: Secondary | ICD-10-CM

## 2013-10-05 DIAGNOSIS — M25559 Pain in unspecified hip: Secondary | ICD-10-CM

## 2013-10-09 ENCOUNTER — Ambulatory Visit: Payer: 59

## 2013-10-30 ENCOUNTER — Ambulatory Visit: Payer: 59

## 2013-11-06 ENCOUNTER — Ambulatory Visit
Admission: RE | Admit: 2013-11-06 | Discharge: 2013-11-06 | Disposition: A | Discharge status: Home / Self Care | Payer: 59 | Source: Ambulatory Visit

## 2013-11-06 DIAGNOSIS — Z1231 Encounter for screening mammogram for malignant neoplasm of breast: Secondary | ICD-10-CM

## 2013-11-08 ENCOUNTER — Other Ambulatory Visit: Payer: Self-pay | Admitting: Family Medicine

## 2013-11-08 DIAGNOSIS — R928 Other abnormal and inconclusive findings on diagnostic imaging of breast: Secondary | ICD-10-CM

## 2013-11-20 ENCOUNTER — Ambulatory Visit
Admission: RE | Admit: 2013-11-20 | Discharge: 2013-11-20 | Disposition: A | Discharge status: Home / Self Care | Payer: 59 | Source: Ambulatory Visit | Attending: Family Medicine | Admitting: Family Medicine

## 2013-11-20 DIAGNOSIS — R928 Other abnormal and inconclusive findings on diagnostic imaging of breast: Secondary | ICD-10-CM

## 2013-12-11 ENCOUNTER — Other Ambulatory Visit (INDEPENDENT_AMBULATORY_CARE_PROVIDER_SITE_OTHER): Payer: 59

## 2013-12-11 DIAGNOSIS — Z0000 Encounter for general adult medical examination without abnormal findings: Secondary | ICD-10-CM

## 2013-12-11 LAB — POCT URINALYSIS DIPSTICK
Glucose, UA: NEGATIVE
Leukocytes, UA: NEGATIVE
Nitrite, UA: NEGATIVE
PH UA: 6.5
RBC UA: NEGATIVE
SPEC GRAV UA: 1.02
Urobilinogen, UA: 0.2

## 2013-12-11 LAB — BASIC METABOLIC PANEL
BUN: 17 mg/dL (ref 6–23)
CALCIUM: 9.1 mg/dL (ref 8.4–10.5)
CO2: 28 mEq/L (ref 19–32)
Chloride: 104 mEq/L (ref 96–112)
Creatinine, Ser: 0.8 mg/dL (ref 0.4–1.2)
GFR: 81.56 mL/min (ref >60.00)
GLUCOSE: 83 mg/dL (ref 70–99)
Potassium: 3 mEq/L — ABNORMAL LOW (ref 3.5–5.1)
SODIUM: 138 meq/L (ref 135–145)

## 2013-12-11 LAB — LIPID PANEL
CHOLESTEROL: 163 mg/dL (ref 0–200)
HDL: 76.4 mg/dL (ref >39.00)
LDL Cholesterol: 73 mg/dL (ref 0–99)
TRIGLYCERIDES: 66 mg/dL (ref 0.0–149.0)
Total CHOL/HDL Ratio: 2
VLDL: 13.2 mg/dL (ref 0.0–40.0)

## 2013-12-11 LAB — CBC WITH DIFFERENTIAL/PLATELET
BASOS ABS: 0 10*3/uL (ref 0.0–0.1)
Basophils Relative: 0.3 % (ref 0.0–3.0)
Eosinophils Absolute: 0.1 10*3/uL (ref 0.0–0.7)
Eosinophils Relative: 2.7 % (ref 0.0–5.0)
HCT: 40.4 % (ref 36.0–46.0)
HEMOGLOBIN: 13.5 g/dL (ref 12.0–15.0)
LYMPHS PCT: 23 % (ref 12.0–46.0)
Lymphs Abs: 1 10*3/uL (ref 0.7–4.0)
MCHC: 33.3 g/dL (ref 30.0–36.0)
MCV: 91.8 fl (ref 78.0–100.0)
MONOS PCT: 8 % (ref 3.0–12.0)
Monocytes Absolute: 0.3 10*3/uL (ref 0.1–1.0)
NEUTROS PCT: 66 % (ref 43.0–77.0)
Neutro Abs: 2.8 10*3/uL (ref 1.4–7.7)
Platelets: 302 10*3/uL (ref 150.0–400.0)
RBC: 4.4 Mil/uL (ref 3.87–5.11)
RDW: 13.7 % (ref 11.5–15.5)
WBC: 4.2 10*3/uL (ref 4.0–10.5)

## 2013-12-11 LAB — HEPATIC FUNCTION PANEL
ALBUMIN: 3.9 g/dL (ref 3.5–5.2)
ALT: 11 U/L (ref 0–35)
AST: 16 U/L (ref 0–37)
Alkaline Phosphatase: 30 U/L — ABNORMAL LOW (ref 39–117)
Bilirubin, Direct: 0.1 mg/dL (ref 0.0–0.3)
TOTAL PROTEIN: 6.7 g/dL (ref 6.0–8.3)
Total Bilirubin: 1.1 mg/dL (ref 0.2–1.2)

## 2013-12-11 LAB — TSH: TSH: 0.73 u[IU]/mL (ref 0.35–4.50)

## 2013-12-19 ENCOUNTER — Encounter: Payer: Self-pay | Admitting: Family Medicine

## 2013-12-19 ENCOUNTER — Ambulatory Visit (INDEPENDENT_AMBULATORY_CARE_PROVIDER_SITE_OTHER): Payer: 59 | Admitting: Family Medicine

## 2013-12-19 VITALS — BP 110/70 | Temp 98.5°F | Ht 62.5 in | Wt 115.0 lb

## 2013-12-19 DIAGNOSIS — I10 Essential (primary) hypertension: Secondary | ICD-10-CM

## 2013-12-19 DIAGNOSIS — N6019 Diffuse cystic mastopathy of unspecified breast: Secondary | ICD-10-CM

## 2013-12-19 MED ORDER — HYDROCHLOROTHIAZIDE 25 MG PO TABS: ORAL_TABLET | ORAL | Status: DC

## 2013-12-19 NOTE — Progress Notes (Signed)
   Subjective:    Patient ID: SHANETA CERVENKA, female    DOB: February 21, 1966, 48 y.o.   MRN: [id number]  HPI Grace [name] is a 48 year old married female nonsmoker G0 P0.......... 48-year-old adopted daughter...Marland KitchenMarland KitchenMarland Kitchen who comes in today for general physical examination  She takes hydrochlorothiazide 25 mg daily for mild hypertension BP 110/70  She is at get routine eye care recommended Dr. Bing Plume, regular dental care, does not do BSE monthly, recent mammogram showed a cystic lesion in her right breast at 9:00. She went back for followup of ultrasound which confirmed that it was a cyst. She's had cystic lesions in her right breast in the past. This LMP now therefore we'll defer Pap for 2 weeks   Review of Systems  Constitutional: Negative.   HENT: Negative.   Eyes: Negative.   Respiratory: Negative.   Cardiovascular: Negative.   Gastrointestinal: Negative.   Genitourinary: Negative.   Musculoskeletal: Negative.   Neurological: Negative.   Psychiatric/Behavioral: Negative.        Objective:   Physical Exam  Nursing note and vitals reviewed. Constitutional: She appears well-developed and well-nourished.  HENT:  Head: Normocephalic and atraumatic.  Right Ear: External ear normal.  Left Ear: External ear normal.  Nose: Nose normal.  Mouth/Throat: Oropharynx is clear and moist.  Eyes: EOM are normal. Pupils are equal, round, and reactive to light.  Neck: Normal range of motion. Neck supple. No thyromegaly present.  Cardiovascular: Normal rate, regular rhythm, normal heart sounds and intact distal pulses.  Exam reveals no gallop and no friction rub.   No murmur heard. Pulmonary/Chest: Effort normal and breath sounds normal.  Abdominal: Soft. Bowel sounds are normal. She exhibits no distension and no mass. There is no tenderness. There is no rebound.  Genitourinary:  Bilateral breast exam shows a cystic lesion left breast 3:00 inch from the nipple also thickening at 12:00. The lesion in the right  breast was previously present has now disappeared  Musculoskeletal: Normal range of motion.  Lymphadenopathy:    She has no cervical adenopathy.  Neurological: She is alert. She has normal reflexes. No cranial nerve deficit. She exhibits normal muscle tone. Coordination normal.  Skin: Skin is warm and dry.  Psychiatric: She has a normal mood and affect. Her behavior is normal. Judgment and thought content normal.          Assessment & Plan:  Healthy female  History of mild hypertension continue hydrochlorothiazide  Cystic lesion left breast observed advise BSE monthly and annual mammogram

## 2013-12-19 NOTE — Patient Instructions (Signed)
Call Dr. Hillis Range for an eye exam  Do a thorough breast exam monthly as we outlined a  And you mammography  Return in 2 weeks for your Pap smear

## 2013-12-19 NOTE — Progress Notes (Signed)
Pre visit review using our clinic review tool, if applicable. No additional management support is needed unless otherwise documented below in the visit note. 

## 2013-12-20 ENCOUNTER — Telehealth: Payer: Self-pay | Admitting: Family Medicine

## 2013-12-20 NOTE — Telephone Encounter (Signed)
Relevant patient education assigned to patient using Emmi. ° °

## 2014-01-02 ENCOUNTER — Encounter: Payer: Self-pay | Admitting: Family Medicine

## 2014-01-02 ENCOUNTER — Other Ambulatory Visit (HOSPITAL_COMMUNITY)
Admission: RE | Admit: 2014-01-02 | Discharge: 2014-01-02 | Disposition: A | Discharge status: Home / Self Care | Payer: 59 | Source: Ambulatory Visit | Attending: Family Medicine | Admitting: Family Medicine

## 2014-01-02 ENCOUNTER — Ambulatory Visit (INDEPENDENT_AMBULATORY_CARE_PROVIDER_SITE_OTHER): Payer: 59 | Admitting: Family Medicine

## 2014-01-02 DIAGNOSIS — Z01419 Encounter for gynecological examination (general) (routine) without abnormal findings: Secondary | ICD-10-CM

## 2014-01-02 DIAGNOSIS — D235 Other benign neoplasm of skin of trunk: Secondary | ICD-10-CM

## 2014-01-02 NOTE — Patient Instructions (Signed)
Return in one year for general physical examination sooner if any problems

## 2014-01-02 NOTE — Progress Notes (Signed)
   Subjective:    Patient ID: Grace [name], female    DOB: [birth year], 48 y.o.   MRN: [id number]  HPI  Grace [name] is a 48 year old married female nonsmoker who comes in for a Pap smear  We saw her couple weeks go for general physical are at that time she was having her menstrual cycle therefore we deferred her Pap smear to today  Review of Systems    review of systems negative Objective:   Physical Exam  Well-developed and nourished female no acute distress vital signs stable she is afebrile  Pelvic examination external genitalia within normal limits vaginal vault is normal cervix was visualized after was done bimanual exam negative      Assessment & Plan:  Normal pelvic examination return when necessary

## 2014-01-03 LAB — CYTOLOGY - PAP

## 2014-04-03 ENCOUNTER — Ambulatory Visit (INDEPENDENT_AMBULATORY_CARE_PROVIDER_SITE_OTHER): Payer: 59 | Admitting: Family Medicine

## 2014-04-03 ENCOUNTER — Encounter: Payer: Self-pay | Admitting: Family Medicine

## 2014-04-03 VITALS — BP 110/70 | Temp 98.6°F | Wt 116.0 lb

## 2014-04-03 DIAGNOSIS — Z23 Encounter for immunization: Secondary | ICD-10-CM

## 2014-04-03 DIAGNOSIS — R1031 Right lower quadrant pain: Secondary | ICD-10-CM

## 2014-04-03 DIAGNOSIS — R109 Unspecified abdominal pain: Secondary | ICD-10-CM

## 2014-04-03 LAB — POCT URINALYSIS DIPSTICK
Bilirubin, UA: NEGATIVE
Glucose, UA: NEGATIVE
Ketones, UA: NEGATIVE
Leukocytes, UA: NEGATIVE
Nitrite, UA: NEGATIVE
PROTEIN UA: NEGATIVE
RBC UA: NEGATIVE
UROBILINOGEN UA: 0.2
pH, UA: 6

## 2014-04-03 NOTE — Progress Notes (Signed)
   Subjective:    Patient ID: Grace [name], female    DOB: 21-Jan-1966, 48 y.o.   MRN: [id number]  HPI Grace [name] is a 48 year old female G0 P0 unable to conceive,,,,,,,, 36-year-old adopted daughter,,,,, who comes in today for evaluation of abdominal pain  Her LMP was about 3 and half weeks ago. A week ago she began having right lower quadrant abdominal pain. It started gradually. She said it radiated to her back. It was coming down. Now the pain is basically gone. Now she feels bloated and distended sore breasts like she's got a start her period  She's had a history of a kidney stone in the past also but this is different pain.   Review of Systems    review of systems otherwise negative Objective:   Physical Exam  Well-developed well-nourished female no acute distress vital signs stable she is afebrile examination the abdomen is normal except for some slight tenderness right lower quadrant no rebound. Bimanual exam shows tenderness right lower quadrant no palpable masses uterus and ovaries otherwise normal      Assessment & Plan:  Right lower quadrant pain for a week this disappears spontaneously with on coming menstrual cycle......Marland Kitchen probably ovarian cyst is resolved spontaneously..... return when necessary

## 2014-04-03 NOTE — Patient Instructions (Signed)
Return when necessary 

## 2014-04-03 NOTE — Progress Notes (Signed)
Pre visit review using our clinic review tool, if applicable. No additional management support is needed unless otherwise documented below in the visit note. 

## 2014-07-28 ENCOUNTER — Telehealth: Payer: Self-pay | Admitting: Family Medicine

## 2014-07-29 ENCOUNTER — Ambulatory Visit (INDEPENDENT_AMBULATORY_CARE_PROVIDER_SITE_OTHER): Payer: 59 | Admitting: Family Medicine

## 2014-07-29 VITALS — BP 92/64 | HR 82 | Temp 98.2°F | Resp 18 | Ht 63.0 in | Wt 117.4 lb

## 2014-07-29 DIAGNOSIS — J069 Acute upper respiratory infection, unspecified: Secondary | ICD-10-CM

## 2014-07-29 DIAGNOSIS — I10 Essential (primary) hypertension: Secondary | ICD-10-CM

## 2014-07-29 DIAGNOSIS — H65195 Other acute nonsuppurative otitis media, recurrent, left ear: Secondary | ICD-10-CM

## 2014-07-29 MED ORDER — AZITHROMYCIN 250 MG PO TABS: ORAL_TABLET | ORAL | Status: DC

## 2014-07-29 MED ORDER — FLUTICASONE PROPIONATE 50 MCG/ACT NA SUSP: 2.0000 | Freq: Every day | NASAL | Status: DC

## 2014-07-29 MED ORDER — PSEUDOEPHEDRINE HCL ER 120 MG PO TB12: 120.0000 mg | ORAL_TABLET | Freq: Two times a day (BID) | ORAL | Status: DC

## 2014-07-29 NOTE — Progress Notes (Signed)
Subjective:  This chart was scribed for Grace Forts, MD by [name], ED Scribe at [facility].The patient was seen in exam room 02 and the patient's care was started at 2:18 PM.   Patient ID: Grace [name], female    DOB: 1966-04-06, 49 y.o.   MRN: [id number]  07/29/2014  Ear Fullness; Sore Throat; Cough; and Fever  HPI  HPI Comments: Grace [name] is a 49 y.o. female who presents to [facility] complaining of a "scratchy" sore throat and a productive cough, onset last week. Symptoms traveled to her head and she developed left ear pain and cerumen impaction in the left ear. Pt reports initially have left ear pain on Friday but this has since subsided. Pt has a fever, chest congestion and hearing loss as associated symptoms. Last fever was 99.7 checked this morning. Her sore throat is usually in the morning. The cough is worse in the evening and morning. Pt has only taken tylenol PM Friday not for relief. She denies ear discharge, sweats, HA, SOB, wheezing, vomiting, diarrhea, and dizziness. Dr. Sherren Mocha is her PCP.  Pt noticed that blood pressure is low today; feels well; denies dizziness, excessive fatigue; takes HCTZ 25mg  daily. Baseline blood pressure runs 110s/70s.  Review of Systems  Constitutional: Positive for fever. Negative for chills, diaphoresis and fatigue.  HENT: Positive for congestion, hearing loss and sore throat. Negative for ear discharge, ear pain, postnasal drip, rhinorrhea, sinus pressure and trouble swallowing.   Respiratory: Positive for cough. Negative for shortness of breath and wheezing.   Cardiovascular: Negative for chest pain, palpitations and leg swelling.  Gastrointestinal: Negative for nausea, vomiting, abdominal pain, diarrhea and constipation.  Neurological: Negative for dizziness and headaches.    Past Medical History  Diagnosis Date  . Allergy   . Hypertension    Past Surgical History  Procedure Laterality Date  . Inguinal hernia repair   1975    right  . Dilation and curettage of uterus  2005, 2006   No Known Allergies      Objective:    BP 92/64 mmHg  Pulse 82  Temp(Src) 98.2 F (36.8 C) (Oral)  Resp 18  Ht 5\' 3"  (1.6 m)  Wt 117 lb 6.4 oz (53.252 kg)  BMI 20.80 kg/m2  SpO2 99% Physical Exam  Constitutional: She is oriented to person, place, and time. She appears well-developed and well-nourished. No distress.  HENT:  Head: Normocephalic and atraumatic.  Right Ear: Tympanic membrane, external ear and ear canal normal.  Left Ear: External ear normal.  Ears:  Nose: Mucosal edema and rhinorrhea present. Right sinus exhibits no maxillary sinus tenderness and no frontal sinus tenderness. Left sinus exhibits no maxillary sinus tenderness and no frontal sinus tenderness.  Mouth/Throat: Oropharynx is clear and moist. No oropharyngeal exudate.  Diffuse erythema and retraction of the left TM with bullous.  Eyes: EOM are normal.  Neck: Normal range of motion.  Cardiovascular: Normal rate, regular rhythm and normal heart sounds.   No murmur heard. Pulmonary/Chest: Effort normal and breath sounds normal.  Lymphadenopathy:    She has cervical adenopathy.  Neurological: She is alert and oriented to person, place, and time. No cranial nerve deficit. She exhibits normal muscle tone. Coordination normal.  Skin: Skin is warm and dry. She is not diaphoretic.  Psychiatric: She has a normal mood and affect. Her behavior is normal.  Nursing note and vitals reviewed.      Assessment & Plan:   1.  Other recurrent acute nonsuppurative otitis media of left ear   2. Acute upper respiratory infection   3. Essential hypertension, benign     -New.   -Due to bullous lesion, treat with Zithromax. -Rx for Pseudoephedrine, Flonase. -Recommend Tylenol or Ibuprofen for pain. -Call if suffers with s/s of perforation. -Blood pressure low but pt asymptomatic.  Should tolerate pseudoephedrine without difficulties or high elevations in  BP.    Meds ordered this encounter  Medications  . azithromycin (ZITHROMAX) 250 MG tablet    Sig: 2 tablets daily x 5 days    Dispense:  10 tablet    Refill:  0  . pseudoephedrine (SUDAFED) 120 MG 12 hr tablet    Sig: Take 1 tablet (120 mg total) by mouth 2 (two) times daily.    Dispense:  14 tablet    Refill:  0  . fluticasone (FLONASE) 50 MCG/ACT nasal spray    Sig: Place 2 sprays into both nostrils daily.    Dispense:  16 g    Refill:  0   No Follow-up on file.    I personally performed the services described in this documentation, which was scribed in my presence. The recorded information has been reviewed and considered.   Chardonay Scritchfield Elayne Guerin, M.D. [facility] [address] [place], [place]  [address] [phone] phone [phone] fax

## 2014-07-29 NOTE — Patient Instructions (Signed)

## 2014-07-30 NOTE — Telephone Encounter (Signed)
PLEASE NOTE: All timestamps contained within this report are represented as [place] Standard Time. CONFIDENTIALTY NOTICE: This fax transmission is intended only for the addressee. It contains information that is legally privileged, confidential or otherwise protected from use or disclosure. If you are not the intended recipient, you are strictly prohibited from reviewing, disclosing, copying using or disseminating any of this information or taking any action in reliance on or regarding this information. If you have received this fax in error, please notify us immediately by telephone so that we can arrange for its return to [place]. Phone: [phone], Toll-Free: [phone], Fax: [phone] Page: 1 of 2 Call Id: [id number] [facility] - Client [place] Patient Name: Grace [name] Gender: Female DOB: [birth year] Age: 49 Y 25 D Return Phone Number: [phone] (Primary), [phone] (Secondary) Address[place] [address] Client [facility] - Client Client Site [facility] - Night Physician Todd, [place] Type Call Call Type Triage / [place] Name Buddy Steidle Relationship To Patient Spouse Return Phone Number [phone] (Primary) Chief Complaint Facial Pain Initial Comment Caller states his wife is having a lot of pain in her ear, side of her face, and neck. She also has a sore throat. She thinks it is due to sinus drainage. PreDisposition Home Care Nurse Assessment Nurse: Dimas Chyle, RN, Levada Dy Date/Time Eilene Ghazi Time): 07/28/2014 12:03:37 AM Confirm and document reason for call. If symptomatic, describe symptoms. ---Caller states his wife is having a lot of pain in her ear, side of her face, and neck. She also has a sore throat. She thinks it is due to sinus drainage. Just came on in the last hour to 2 hours. Has the patient traveled out of the  country within the last 30 days? ---Not Applicable Does the patient require triage? ---Yes Related visit to physician within the last 2 weeks? ---No Does the PT have any chronic conditions? (i.e. diabetes, asthma, etc.) ---Yes List chronic conditions. ---HTN Did the patient indicate they were pregnant? ---No Guidelines Guideline Title Affirmed Question Affirmed Notes Nurse Date/Time (Eastern Time) Earache [1] SEVERE pain AND [2] not improved 2 hours after taking analgesic medication (e.g., ibuprofen or acetaminophen) Dimas Chyle, RN, Levada Dy 07/28/2014 12:04:40 AM Disp. Time Eilene Ghazi Time) Disposition Final User 07/28/2014 12:07:11 AM See Physician within 4 Hours (or PCP triage) Yes Dimas Chyle, RN, Levada Dy PLEASE NOTE: All timestamps contained within this report are represented as [place] Standard Time. CONFIDENTIALTY NOTICE: This fax transmission is intended only for the addressee. It contains information that is legally privileged, confidential or otherwise protected from use or disclosure. If you are not the intended recipient, you are strictly prohibited from reviewing, disclosing, copying using or disseminating any of this information or taking any action in reliance on or regarding this information. If you have received this fax in error, please notify us immediately by telephone so that we can arrange for its return to [place]. Phone: [phone], Toll-Free: [phone], Fax: [phone] Page: 2 of 2 Call Id: [id number] Caller Understands: Yes Disagree/Comply: Comply Care Advice Given Per Guideline SEE PHYSICIAN WITHIN 4 HOURS (or PCP triage): * IF NO PCP TRIAGE: You need to be seen. Go to _______________ (ED/UCC or office if it will be open) within the next 3 or 4 hours. Go sooner if you become worse. PAIN MEDICINES: ACETAMINOPHEN (E.G., TYLENOL): IBUPROFEN (E.G., MOTRIN, ADVIL): CALL BACK IF: * You become worse. CARE ADVICE given per Earache (Adult) guideline. After Care Instructions  Given  Call Event Type User Date / Time Description Referrals GO TO FACILITY UNDECIDED

## 2014-07-31 ENCOUNTER — Telehealth: Payer: Self-pay | Admitting: Family Medicine

## 2014-07-31 NOTE — Telephone Encounter (Signed)
PLEASE NOTE: All timestamps contained within this report are represented as [place] Standard Time. CONFIDENTIALTY NOTICE: This fax transmission is intended only for the addressee. It contains information that is legally privileged, confidential or otherwise protected from use or disclosure. If you are not the intended recipient, you are strictly prohibited from reviewing, disclosing, copying using or disseminating any of this information or taking any action in reliance on or regarding this information. If you have received this fax in error, please notify us immediately by telephone so that we can arrange for its return to [place]. Phone: [phone], Toll-Free: [phone], Fax: [phone] Page: 1 of 2 Call Id: [id number] [facility] - Client [place] Patient Name: Grace [name] Gender: Female DOB: 11/25/1965 Age: 49 Y 28 D Return Phone Number: [phone] (Primary), [phone] (Secondary) Address: [address] City/State/Zip: [place] [address] Client [facility] - Client Client Site [place] - Day Physician Todd, [place] Type Call Call Type Triage / [place] Name Buddy Kohl Relationship To Patient Spouse Return Phone Number [phone] (Primary) Chief Complaint Ear Fullness or Congestion Initial Comment Caller states wife is sick; ear infection according to UC; LT ear completely closed since Fri; on azithromax 2 tablets daily 250 mg;-on day 3; sudafed 1 tab by mouth 2x daily; was prescribed Flonase but not using it; PreDisposition Call Doctor Nurse Assessment Nurse: Ronnald Ramp, RN, Miranda Date/Time (Eastern Time): 07/31/2014 11:29:26 AM Confirm and document reason for call. If symptomatic, describe symptoms. ---Caller states his wife was seen in UC on Sunday afternoon and diagnosed with an ear infection. She was prescribed antibiotics. She is still having  symptoms. Has the patient traveled out of the country within the last 30 days? ---Not Applicable Does the patient require triage? ---Yes Related visit to physician within the last 2 weeks? ---Yes Does the PT have any chronic conditions? (i.e. diabetes, asthma, etc.) ---Yes List chronic conditions. ---Edema Did the patient indicate they were pregnant? ---No Guidelines Guideline Title Affirmed Question Affirmed Notes Nurse Date/Time Eilene Ghazi Time) Ear - Otitis Media Follow-up Call [1] Taking antibiotic < 72 hours (3 days) and [2] pain persists Ronnald Ramp, RN, Miranda 07/31/2014 11:31:46 AM Disp. Time Eilene Ghazi Time) Disposition Final User 07/31/2014 11:38:49 AM Home Care Yes Ronnald Ramp, RN, Judge Stall Understands: Yes PLEASE NOTE: All timestamps contained within this report are represented as [place] Standard Time. CONFIDENTIALTY NOTICE: This fax transmission is intended only for the addressee. It contains information that is legally privileged, confidential or otherwise protected from use or disclosure. If you are not the intended recipient, you are strictly prohibited from reviewing, disclosing, copying using or disseminating any of this information or taking any action in reliance on or regarding this information. If you have received this fax in error, please notify us immediately by telephone so that we can arrange for its return to [place]. Phone: [phone], Toll-Free: [phone], Fax: [phone] Page: 2 of 2 Call Id: [id number] Disagree/Comply: Comply Care Advice Given Per Guideline HOME CARE: You should be able to treat this at home. REASSURANCE: * Most bacterial infections do not respond to the first dose of an antibiotic PAIN OR FEVER MEDICINES: ACETAMINOPHEN (E.G., TYLENOL): IBUPROFEN (E.G., MOTRIN, ADVIL): NAPROXEN (E.G., ALEVE): * Often there is no improvement the first day CALL BACK IF: * Fever lasts over 2 days on antibiotics * Earache lasts over 3 days on antibiotics * You  become worse CARE ADVICE given per Ear - Otitis Media Follow-Up  Call (Adult) guideline After Care Instructions Given Call Event Type User Date / Time Description

## 2014-07-31 NOTE — Telephone Encounter (Signed)
noted 

## 2014-08-02 ENCOUNTER — Encounter: Payer: Self-pay | Admitting: Family Medicine

## 2014-08-02 ENCOUNTER — Ambulatory Visit (INDEPENDENT_AMBULATORY_CARE_PROVIDER_SITE_OTHER): Payer: 59 | Admitting: Family Medicine

## 2014-08-02 VITALS — BP 110/68 | Temp 98.1°F | Wt 120.0 lb

## 2014-08-02 DIAGNOSIS — B9789 Other viral agents as the cause of diseases classified elsewhere: Principal | ICD-10-CM

## 2014-08-02 DIAGNOSIS — J069 Acute upper respiratory infection, unspecified: Secondary | ICD-10-CM

## 2014-08-02 MED ORDER — HYDROCODONE-HOMATROPINE 5-1.5 MG/5ML PO SYRP: 5.0000 mL | ORAL_SOLUTION | Freq: Three times a day (TID) | ORAL | Status: DC | PRN

## 2014-08-02 NOTE — Progress Notes (Signed)
   Subjective:    Patient ID: Grace [name], female    DOB: Aug 11, 1965, 49 y.o.   MRN: [id number]  HPI Rohini is a 49 year old married female nonsmoker who comes in today for evaluation of head congestion runny nose and slight cough for a week  She said she went to an urgent care this past Sunday and was told she had an ear infection. She was given an antibiotic. She doesn't seem to be much better   Review of Systems Review of systems otherwise negative    Objective:   Physical Exam  Well-developed well-nourished female no acute distress vital signs stable she's afebrile HEENT were negative neck was supple no adenopathy lungs are clear      Assessment & Plan:  Viral syndrome......... treat symptomatically....... antibiotics not indicated

## 2014-08-02 NOTE — Patient Instructions (Signed)
Drink lots of water  Afrin nasal spray,,,,,,,,,, one shot up each nostril 5 night limit  Hydromet 1/2-1 teaspoon at bedtime when necessary for cough and cold  Stop the antibiotic  Return when necessary

## 2014-10-31 ENCOUNTER — Other Ambulatory Visit: Payer: Self-pay

## 2014-10-31 DIAGNOSIS — Z1231 Encounter for screening mammogram for malignant neoplasm of breast: Secondary | ICD-10-CM

## 2014-11-19 ENCOUNTER — Ambulatory Visit
Admission: RE | Admit: 2014-11-19 | Discharge: 2014-11-19 | Disposition: A | Discharge status: Home / Self Care | Payer: 59 | Source: Ambulatory Visit

## 2014-11-19 DIAGNOSIS — Z1231 Encounter for screening mammogram for malignant neoplasm of breast: Secondary | ICD-10-CM

## 2015-02-11 ENCOUNTER — Other Ambulatory Visit: Payer: Self-pay | Admitting: Family Medicine

## 2015-03-22 ENCOUNTER — Ambulatory Visit: Payer: 59 | Admitting: Family Medicine

## 2015-03-22 ENCOUNTER — Encounter: Payer: Self-pay | Admitting: Family Medicine

## 2015-03-22 ENCOUNTER — Ambulatory Visit (INDEPENDENT_AMBULATORY_CARE_PROVIDER_SITE_OTHER): Payer: 59 | Admitting: Family Medicine

## 2015-03-22 VITALS — BP 120/80 | HR 90 | Temp 98.7°F | Ht 63.0 in | Wt 120.5 lb

## 2015-03-22 DIAGNOSIS — R109 Unspecified abdominal pain: Secondary | ICD-10-CM

## 2015-03-22 DIAGNOSIS — K5900 Constipation, unspecified: Secondary | ICD-10-CM

## 2015-03-22 DIAGNOSIS — N926 Irregular menstruation, unspecified: Secondary | ICD-10-CM

## 2015-03-22 NOTE — Progress Notes (Signed)
Pre visit review using our clinic review tool, if applicable. No additional management support is needed unless otherwise documented below in the visit note. 

## 2015-03-22 NOTE — Progress Notes (Signed)
HPI:  Acute visit for:  Chronic Abd pain - she reports: -mild intermittent RLQ pain, a few times per month for at least 10 years, unchanged -hx of same with CT and [place] remotely several times for this, dx with mild R hip arthritis by PCP  -now with irr menstrual bleeding for several year, sometimes bleeding between periods -chrornic intermittent diarrhea and alt constipation and gas her whole life -denies: fevers, malaise, weight loss, melena, hematochezia -she was reading online and is worried about endometriosis and wants to see a gynecologist  ROS: See pertinent positives and negatives per HPI.  Past Medical History  Diagnosis Date  . Allergy   . Hypertension     Past Surgical History  Procedure Laterality Date  . Inguinal hernia repair  1975    right  . Dilation and curettage of uterus  2005, 2006    Family History  Problem Relation Age of Onset  . Hypertension      family hx  . Cancer      prostate  . Colon polyps Mother   . Hypertension Mother   . Colon polyps Father   . Cancer Father   . Hypertension Father   . Colon polyps Sister 53  . Colon polyps Brother 85  . Stomach cancer Neg Hx     Social History   Social History  . Marital Status: Married    Spouse Name: N/A  . Number of Children: N/A  . Years of Education: N/A   Social History Main Topics  . Smoking status: Former Smoker    Quit date: 01/21/1997  . Smokeless tobacco: Never Used  . Alcohol Use: 1.2 oz/week    2 Glasses of wine per week  . Drug Use: No  . Sexual Activity: Not Asked   Other Topics Concern  . None   Social History Narrative     Current outpatient prescriptions:  .  hydrochlorothiazide (HYDRODIURIL) 25 MG tablet, take 1 tablet by mouth every morning, Disp: 100 tablet, Rfl: 3  EXAM:  Filed Vitals:   03/22/15 1503  BP: 120/80  Pulse: 90  Temp: 98.7 F (37.1 C)    Body mass index is 21.35 kg/(m^2).  GENERAL: vitals reviewed and listed above, alert, oriented,  appears well hydrated and in no acute distress  HEENT: atraumatic, conjunttiva clear, no obvious abnormalities on inspection of external nose and ears  NECK: no obvious masses on inspection  LUNGS: clear to auscultation bilaterally, no wheezes, rales or rhonchi, good air movement  CV: HRRR, no peripheral edema  ABD: BS+, soft, NTTP, no rebound or guarding  MS: moves all extremities without noticeable abnormality  PSYCH: pleasant and cooperative, no obvious depression or anxiety  ASSESSMENT AND PLAN:  Discussed the following assessment and plan:  Abdominal pain, unspecified abdominal location  Irregular menstrual bleeding  Constipation, unspecified constipation type  -in terms of her abd pain, bowel issues, gas, given symptoms for > 10 years with multiple evaluations in the past and unchanges - suspect IBS -M with constipation predominant on further questioning -advised trial fiber, mirilax for constipation and trial various to changes to diet to see if this helps, follow up in 3 months with PCP -for the irr menstrual bleeding given bleeding between cycles, > 35 and her concerns advised evaluation with gyn - perimenopausal most likely. She agreed to set this up. -Patient advised to return or notify a doctor immediately if symptoms worsen or persist or new concerns arise.  Patient Instructions  BEFORE YOU  LEAVE: -FODMAP diet -follow up with Dr. Sherren Mocha in 2-3 months  Call number provided to set [place] evaluation with the gynecologist regarding the irregular bleeding and pain  Trial 1 week without diary, then 1 week without gluten to see if either help  Then can work with the [place] to see if there are any triggers here  Please take a fiber supplement daily and mirilax as needed for constipation      KIM, HANNAH R.

## 2015-03-22 NOTE — Patient Instructions (Signed)
BEFORE YOU LEAVE: -FODMAP diet -follow up with Dr. Sherren Mocha in 2-3 months  Call number provided to set [place] evaluation with the gynecologist regarding the irregular bleeding and pain  Trial 1 week without diary, then 1 week without gluten to see if either help  Then can work with the [place] to see if there are any triggers here  Please take a fiber supplement daily and mirilax as needed for constipation

## 2015-04-18 ENCOUNTER — Ambulatory Visit (INDEPENDENT_AMBULATORY_CARE_PROVIDER_SITE_OTHER): Payer: 59 | Admitting: Obstetrics and Gynecology

## 2015-04-18 ENCOUNTER — Encounter: Payer: Self-pay | Admitting: Obstetrics and Gynecology

## 2015-04-18 VITALS — BP 120/70 | HR 88 | Resp 16 | Ht 62.5 in | Wt 120.0 lb

## 2015-04-18 DIAGNOSIS — N941 Dyspareunia: Secondary | ICD-10-CM | POA: Diagnosis not present

## 2015-04-18 DIAGNOSIS — IMO0002 Reserved for concepts with insufficient information to code with codable children: Secondary | ICD-10-CM

## 2015-04-18 DIAGNOSIS — Z01419 Encounter for gynecological examination (general) (routine) without abnormal findings: Secondary | ICD-10-CM

## 2015-04-18 DIAGNOSIS — R1031 Right lower quadrant pain: Secondary | ICD-10-CM | POA: Diagnosis not present

## 2015-04-18 DIAGNOSIS — Z124 Encounter for screening for malignant neoplasm of cervix: Secondary | ICD-10-CM

## 2015-04-18 DIAGNOSIS — Z0000 Encounter for general adult medical examination without abnormal findings: Secondary | ICD-10-CM

## 2015-04-18 LAB — COMPREHENSIVE METABOLIC PANEL
ALBUMIN: 4.5 g/dL (ref 3.6–5.1)
ALT: 12 U/L (ref 6–29)
AST: 16 U/L (ref 10–35)
Alkaline Phosphatase: 37 U/L (ref 33–115)
BUN: 17 mg/dL (ref 7–25)
CALCIUM: 9.8 mg/dL (ref 8.6–10.2)
CO2: 31 mmol/L (ref 20–31)
CREATININE: 0.86 mg/dL (ref 0.50–1.10)
Chloride: 101 mmol/L (ref 98–110)
GLUCOSE: 101 mg/dL — AB (ref 65–99)
POTASSIUM: 3.6 mmol/L (ref 3.5–5.3)
Sodium: 142 mmol/L (ref 135–146)
Total Bilirubin: 0.7 mg/dL (ref 0.2–1.2)
Total Protein: 7.3 g/dL (ref 6.1–8.1)

## 2015-04-18 NOTE — Progress Notes (Signed)
49 y.o. [id number] MarriedCaucasianF here for annual exam.  She c/o intermittent pain in her RLQ for a year, notices it more often about a week prior to her cycle, not always. The pain is crampy and achey, tender to touch. Lasts for a minute at a time, comes and goes over a couple of hours. Up to a 4/10 in severity. She has slight arthritis in her right hip.  She c/o entry dyspareunia. Menses about every 5 weeks, one time in 3 weeks. Bleeds x 2-5 days, varies in flow. On her heaviest cycle she can saturate a super tampon in 2-3 hours. No BTB. Cramps are bad, alieve helps.  BM qd, normal, occasionally strains. She has noticed the pain in association with BM's.  No urinary c/o, has frequency, voids normal amounts, drinks caffeine.  She had 2 SABs, then had infertility. Adopted daughter is 5.     Patient's last menstrual period was 03/16/2015 (approximate).          Sexually active: Yes.    The current method of family planning is none.    Exercising: Yes.    Walking, tennis Smoker:  no  Health Maintenance: Pap:  01/02/14 Normal  History of abnormal Pap:  Yes, 1990. Normal since then MMG:  11/20/14 BIRADS1:neg Colonoscopy:  02/2012 Normal - repeat 10 years  BMD:   ~ 2004 - Osteopenia  TDaP:  11/2011    reports that she quit smoking about 18 years ago. She has never used smokeless tobacco. She reports that she drinks about 1.2 oz of alcohol per week. She reports that she does not use illicit drugs. Works in [employer], daughter is 2.  Past Medical History  Diagnosis Date  . Allergy   . Hypertension   . Arthritis     Past Surgical History  Procedure Laterality Date  . Inguinal hernia repair  1975    right  . Dilation and curettage of uterus  2005, 2006    Current Outpatient Prescriptions  Medication Sig Dispense Refill  . hydrochlorothiazide (HYDRODIURIL) 25 MG tablet take 1 tablet by mouth every morning 100 tablet 3   No current facility-administered medications for this visit.    Family  History  Problem Relation Age of Onset  . Hypertension      family hx  . Cancer      prostate  . Colon polyps Mother   . Hypertension Mother   . Colon polyps Father   . Cancer Father     Prostate  . Hypertension Father   . Colon polyps Sister 66  . Colon polyps Brother 26  . Stomach cancer Neg Hx   . Diabetes Paternal Grandmother   . Stroke Paternal Grandfather     Review of Systems  Exam:   BP 120/70 mmHg  Pulse 88  Resp 16  Ht 5' 2.5" (1.588 m)  Wt 120 lb (54.432 kg)  BMI 21.59 kg/m2  LMP 03/16/2015 (Approximate)  Weight change: @WEIGHTCHANGE @ Height:   Height: 5' 2.5" (158.8 cm)  Ht Readings from Last 3 Encounters:  04/18/15 5' 2.5" (1.588 m)  03/22/15 5\' 3"  (1.6 m)  07/29/14 5\' 3"  (1.6 m)    General appearance: alert, cooperative and appears stated age Head: Normocephalic, without obvious abnormality, atraumatic Neck: no adenopathy, supple, symmetrical, trachea midline and thyroid normal to inspection and palpation Lungs: clear to auscultation bilaterally Breasts: normal appearance, no masses or tenderness Heart: regular rate and rhythm Abdomen: soft, non-tender; bowel sounds normal; no masses,  no organomegaly Extremities:  extremities normal, atraumatic, no cyanosis or edema Skin: Skin color, texture, turgor normal. No rashes or lesions Lymph nodes: Cervical, supraclavicular, and axillary nodes normal. No abnormal inguinal nodes palpated Neurologic: Grossly normal   Pelvic: External genitalia:  no lesions              Urethra:  normal appearing urethra with no masses, tenderness or lesions              Bartholins and Skenes: normal                 Vagina: normal appearing vagina with normal color and discharge, no lesions, mild atrophy. Pelvic floor not tender              Cervix: no lesions               Bimanual Exam:  Uterus:  normal size, contour, position, consistency, mobility, non-tender              Adnexa: no mass, fullness, tenderness                Rectovaginal: Confirms               Anus:  normal sphincter tone, no lesions  Chaperone was present for exam.  A:  Well Woman with normal exam  Dyspareunia  RLQ abdominal pain, intermittent, worse a week prior to her cycle  HTN, on medication, low potassium last year  P:   Pap with hpv  Lipids and CBC with primary last year were normal  CMP, potassium was low at last check  Vit d  Mammogram is UTD  Lubrication for entry dyspareunia, if still hurting would start vaginal estrogen  Calander her abdominal pain, cycles, BM  Set up a gyn ultrasound, will try and set up about a week prior to her cycle

## 2015-04-18 NOTE — Patient Instructions (Signed)

## 2015-04-19 LAB — HEMOGLOBIN, FINGERSTICK: Hemoglobin, fingerstick: 14 g/dL (ref 12.0–16.0)

## 2015-04-19 LAB — VITAMIN D 25 HYDROXY (VIT D DEFICIENCY, FRACTURES): VIT D 25 HYDROXY: 22 ng/mL — AB (ref 30–100)

## 2015-04-22 LAB — IPS PAP TEST WITH HPV

## 2015-05-03 ENCOUNTER — Telehealth: Payer: Self-pay | Admitting: Obstetrics and Gynecology

## 2015-05-03 NOTE — Telephone Encounter (Signed)
Called patient to review benefits for procedure. Left voicemail to call back and review. °

## 2015-05-03 NOTE — Telephone Encounter (Signed)
Patient returned call. Reviewed benefit for PUS. Patient understood and agreeable. Patient scheduled 05/14/15 with Dr Talbert Nan. Patient agreeable to arrival date/time. Patient understands and agreeable to 72 hour cancellation policy with $372 fee. Ok to close.

## 2015-05-14 ENCOUNTER — Ambulatory Visit (INDEPENDENT_AMBULATORY_CARE_PROVIDER_SITE_OTHER): Payer: 59 | Admitting: Obstetrics and Gynecology

## 2015-05-14 ENCOUNTER — Ambulatory Visit (INDEPENDENT_AMBULATORY_CARE_PROVIDER_SITE_OTHER): Payer: 59

## 2015-05-14 ENCOUNTER — Encounter: Payer: Self-pay | Admitting: Obstetrics and Gynecology

## 2015-05-14 VITALS — BP 118/80 | HR 76 | Resp 15 | Wt 120.0 lb

## 2015-05-14 DIAGNOSIS — R1031 Right lower quadrant pain: Secondary | ICD-10-CM

## 2015-05-14 NOTE — Patient Instructions (Signed)
Start vit D 1,000 IU a day  Calender your abdominal pain, BM, diet and cycles

## 2015-05-14 NOTE — Progress Notes (Signed)
    S: the patient has had a long h/o RLQ abdominal pain. She thought the pain was cyclic, but has noticed it on and off randomly over the last month, no bleeding since she was here last. Her abdomen is tender to the touch. Normal BM qd. Normal gyn ultrasound today. Pictures reviewed with the patient.   Review of Systems  Gastrointestinal: Positive for abdominal pain.       Bloating  Genitourinary:       Menstrual cycle changes Loss of sexual interest Painful intercourse   All other systems reviewed and are negative.  O: General: alert white female in NAD Blood pressure 118/80, pulse 76, resp. rate 15, weight 120 lb (54.432 kg), last menstrual period 03/16/2015. Abdomen: soft, mildly tender in the RLQ just lateral to and beneath the umbilicus, no rebound, no guarding, no masses  A/P  RLQ abdominal pain intermittent for the last year. Normal BM's. Normal GYN ultrasound.   Ultrasound pictures reviewed with the patient  Will refer to GI for evaluation  Calender pain, BM, cycles and pay attention to diet with her pain  15 minutes was spent face to face with the patient, 50% in counselling

## 2015-05-21 ENCOUNTER — Other Ambulatory Visit: Payer: Self-pay | Admitting: Gastroenterology

## 2015-05-21 DIAGNOSIS — R1031 Right lower quadrant pain: Secondary | ICD-10-CM

## 2015-05-21 LAB — CBC AND DIFFERENTIAL
HEMATOCRIT: 41 % (ref 36–46)
Hemoglobin: 13.7 g/dL (ref 12.0–16.0)

## 2015-05-21 LAB — TSH: TSH: 0.94 u[IU]/mL (ref <=5.90)

## 2015-05-29 ENCOUNTER — Other Ambulatory Visit: Payer: 59

## 2015-05-30 ENCOUNTER — Ambulatory Visit
Admission: RE | Admit: 2015-05-30 | Discharge: 2015-05-30 | Disposition: A | Discharge status: Home / Self Care | Payer: 59 | Source: Ambulatory Visit | Attending: Gastroenterology | Admitting: Gastroenterology

## 2015-05-30 DIAGNOSIS — R1031 Right lower quadrant pain: Secondary | ICD-10-CM

## 2015-05-30 MED ORDER — IOPAMIDOL (ISOVUE-300) INJECTION 61%
100.0000 mL | Freq: Once | INTRAVENOUS | Status: AC | PRN
  Administered 2015-05-30: 100 mL via INTRAVENOUS

## 2015-06-19 ENCOUNTER — Encounter: Payer: Self-pay | Admitting: Family Medicine

## 2015-07-18 ENCOUNTER — Telehealth: Payer: Self-pay | Admitting: Family Medicine

## 2015-07-18 NOTE — Telephone Encounter (Signed)
Pt husband call to say wife has a bump in eye and is asking the name of an eye doctor.

## 2015-07-18 NOTE — Telephone Encounter (Signed)
Patient has a bump on the inside of her right eye.  Advised patient to call Dr Talbert Forest' office.

## 2015-10-28 ENCOUNTER — Other Ambulatory Visit: Payer: Self-pay

## 2015-10-28 DIAGNOSIS — Z1231 Encounter for screening mammogram for malignant neoplasm of breast: Secondary | ICD-10-CM

## 2015-11-26 ENCOUNTER — Ambulatory Visit: Payer: 59

## 2015-12-02 ENCOUNTER — Ambulatory Visit: Payer: 59

## 2015-12-16 ENCOUNTER — Ambulatory Visit
Admission: RE | Admit: 2015-12-16 | Discharge: 2015-12-16 | Disposition: A | Discharge status: Home / Self Care | Payer: 59 | Source: Ambulatory Visit

## 2015-12-16 DIAGNOSIS — Z1231 Encounter for screening mammogram for malignant neoplasm of breast: Secondary | ICD-10-CM

## 2016-03-09 ENCOUNTER — Other Ambulatory Visit: Payer: Self-pay | Admitting: Family Medicine

## 2016-04-20 ENCOUNTER — Ambulatory Visit: Payer: 59 | Admitting: Obstetrics and Gynecology

## 2016-04-20 ENCOUNTER — Encounter: Payer: Self-pay | Admitting: Obstetrics and Gynecology

## 2016-04-20 VITALS — BP 118/80 | HR 80 | Resp 14 | Ht 62.5 in | Wt 128.0 lb

## 2016-04-20 DIAGNOSIS — Z01419 Encounter for gynecological examination (general) (routine) without abnormal findings: Secondary | ICD-10-CM | POA: Diagnosis not present

## 2016-04-20 DIAGNOSIS — N952 Postmenopausal atrophic vaginitis: Secondary | ICD-10-CM

## 2016-04-20 DIAGNOSIS — Z0000 Encounter for general adult medical examination without abnormal findings: Secondary | ICD-10-CM

## 2016-04-20 DIAGNOSIS — R6882 Decreased libido: Secondary | ICD-10-CM

## 2016-04-20 DIAGNOSIS — E559 Vitamin D deficiency, unspecified: Secondary | ICD-10-CM

## 2016-04-20 DIAGNOSIS — N941 Unspecified dyspareunia: Secondary | ICD-10-CM | POA: Diagnosis not present

## 2016-04-20 LAB — COMPREHENSIVE METABOLIC PANEL
ALT: 16 U/L (ref 6–29)
AST: 21 U/L (ref 10–35)
Albumin: 4.6 g/dL (ref 3.6–5.1)
Alkaline Phosphatase: 41 U/L (ref 33–115)
BUN: 14 mg/dL (ref 7–25)
CHLORIDE: 101 mmol/L (ref 98–110)
CO2: 26 mmol/L (ref 20–31)
Calcium: 10.1 mg/dL (ref 8.6–10.2)
Creat: 0.84 mg/dL (ref 0.50–1.10)
Glucose, Bld: 109 mg/dL — ABNORMAL HIGH (ref 65–99)
POTASSIUM: 4 mmol/L (ref 3.5–5.3)
Sodium: 139 mmol/L (ref 135–146)
TOTAL PROTEIN: 7.4 g/dL (ref 6.1–8.1)
Total Bilirubin: 0.5 mg/dL (ref 0.2–1.2)

## 2016-04-20 LAB — CBC
HEMATOCRIT: 41.9 % (ref 35.0–45.0)
Hemoglobin: 14.2 g/dL (ref 11.7–15.5)
MCH: 30.6 pg (ref 27.0–33.0)
MCHC: 33.9 g/dL (ref 32.0–36.0)
MCV: 90.3 fL (ref 80.0–100.0)
MPV: 9 fL (ref 7.5–12.5)
Platelets: 315 10*3/uL (ref 140–400)
RBC: 4.64 MIL/uL (ref 3.80–5.10)
RDW: 13.6 % (ref 11.0–15.0)
WBC: 8 10*3/uL (ref 3.8–10.8)

## 2016-04-20 MED ORDER — ESTRADIOL 10 MCG VA TABS: 1.0000 | ORAL_TABLET | VAGINAL | 3 refills | Status: DC

## 2016-04-20 NOTE — Patient Instructions (Signed)
EXERCISE AND DIET:  We recommended that you start or continue a regular exercise program for good health. Regular exercise means any activity that makes your heart beat faster and makes you sweat.  We recommend exercising at least 30 minutes per day at least 3 days a week, preferably 4 or 5.  We also recommend a diet low in fat and sugar.  Inactivity, poor dietary choices and obesity can cause diabetes, heart attack, stroke, and kidney damage, among others.    ALCOHOL AND SMOKING:  Women should limit their alcohol intake to no more than 7 drinks/beers/glasses of wine (combined, not each!) per week. Moderation of alcohol intake to this level decreases your risk of breast cancer and liver damage. And of course, no recreational drugs are part of a healthy lifestyle.  And absolutely no smoking or even second hand smoke. Most people know smoking can cause heart and lung diseases, but did you know it also contributes to weakening of your bones? Aging of your skin?  Yellowing of your teeth and nails?  CALCIUM AND VITAMIN D:  Adequate intake of calcium and Vitamin D are recommended.  The recommendations for exact amounts of these supplements seem to change often, but generally speaking 600 mg of calcium (either carbonate or citrate) and 800 units of Vitamin D per day seems prudent. Certain women may benefit from higher intake of Vitamin D.  If you are among these women, your doctor will have told you during your visit.    PAP SMEARS:  Pap smears, to check for cervical cancer or precancers,  have traditionally been done yearly, although recent scientific advances have shown that most women can have pap smears less often.  However, every woman still should have a physical exam from her gynecologist every year. It will include a breast check, inspection of the vulva and vagina to check for abnormal growths or skin changes, a visual exam of the cervix, and then an exam to evaluate the size and shape of the uterus and  ovaries.  And after 50 years of age, a rectal exam is indicated to check for rectal cancers. We will also provide age appropriate advice regarding health maintenance, like when you should have certain vaccines, screening for sexually transmitted diseases, bone density testing, colonoscopy, mammograms, etc.   MAMMOGRAMS:  All women over 40 years old should have a yearly mammogram. Many facilities now offer a "3D" mammogram, which may cost around $50 extra out of pocket. If possible,  we recommend you accept the option to have the 3D mammogram performed.  It both reduces the number of women who will be called back for extra views which then turn out to be normal, and it is better than the routine mammogram at detecting truly abnormal areas.    COLONOSCOPY:  Colonoscopy to screen for colon cancer is recommended for all women at age 50.  We know, you hate the idea of the prep.  We agree, BUT, having colon cancer and not knowing it is worse!!  Colon cancer so often starts as a polyp that can be seen and removed at colonscopy, which can quite literally save your life!  And if your first colonoscopy is normal and you have no family history of colon cancer, most women don't have to have it again for 10 years.  Once every ten years, you can do something that may end up saving your life, right?  We will be happy to help you get it scheduled when you are ready.    Be sure to check your insurance coverage so you understand how much it will cost.  It may be covered as a preventative service at no cost, but you should check your particular policy.      Breast Self-Awareness Practicing breast self-awareness may pick up problems early, prevent significant medical complications, and possibly save your life. By practicing breast self-awareness, you can become familiar with how your breasts look and feel and if your breasts are changing. This allows you to notice changes early. It can also offer you some reassurance that your  breast health is good. One way to learn what is normal for your breasts and whether your breasts are changing is to do a breast self-exam. If you find a lump or something that was not present in the past, it is best to contact your caregiver right away. Other findings that should be evaluated by your caregiver include nipple discharge, especially if it is bloody; skin changes or reddening; areas where the skin seems to be pulled in (retracted); or new lumps and bumps. Breast pain is seldom associated with cancer (malignancy), but should also be evaluated by a caregiver. HOW TO PERFORM A BREAST SELF-EXAM The best time to examine your breasts is 5-7 days after your menstrual period is over. During menstruation, the breasts are lumpier, and it may be more difficult to pick up changes. If you do not menstruate, have reached menopause, or had your uterus removed (hysterectomy), you should examine your breasts at regular intervals, such as monthly. If you are breastfeeding, examine your breasts after a feeding or after using a breast pump. Breast implants do not decrease the risk for lumps or tumors, so continue to perform breast self-exams as recommended. Talk to your caregiver about how to determine the difference between the implant and breast tissue. Also, talk about the amount of pressure you should use during the exam. Over time, you will become more familiar with the variations of your breasts and more comfortable with the exam. A breast self-exam requires you to remove all your clothes above the waist. 1. Look at your breasts and nipples. Stand in front of a mirror in a room with good lighting. With your hands on your hips, push your hands firmly downward. Look for a difference in shape, contour, and size from one breast to the other (asymmetry). Asymmetry includes puckers, dips, or bumps. Also, look for skin changes, such as reddened or scaly areas on the breasts. Look for nipple changes, such as discharge,  dimpling, repositioning, or redness. 2. Carefully feel your breasts. This is best done either in the shower or tub while using soapy water or when flat on your back. Place the arm (on the side of the breast you are examining) above your head. Use the pads (not the fingertips) of your three middle fingers on your opposite hand to feel your breasts. Start in the underarm area and use  inch (2 cm) overlapping circles to feel your breast. Use 3 different levels of pressure (light, medium, and firm pressure) at each circle before moving to the next circle. The light pressure is needed to feel the tissue closest to the skin. The medium pressure will help to feel breast tissue a little deeper, while the firm pressure is needed to feel the tissue close to the ribs. Continue the overlapping circles, moving downward over the breast until you feel your ribs below your breast. Then, move one finger-width towards the center of the body. Continue to use the    inch (2 cm) overlapping circles to feel your breast as you move slowly up toward the collar bone (clavicle) near the base of the neck. Continue the up and down exam using all 3 pressures until you reach the middle of the chest. Do this with each breast, carefully feeling for lumps or changes. 3.  Keep a written record with breast changes or normal findings for each breast. By writing this information down, you do not need to depend only on memory for size, tenderness, or location. Write down where you are in your menstrual cycle, if you are still menstruating. Breast tissue can have some lumps or thick tissue. However, see your caregiver if you find anything that concerns you.  SEEK MEDICAL CARE IF:  You see a change in shape, contour, or size of your breasts or nipples.   You see skin changes, such as reddened or scaly areas on the breasts or nipples.   You have an unusual discharge from your nipples.   You feel a new lump or unusually thick areas.     This information is not intended to replace advice given to you by your health care provider. Make sure you discuss any questions you have with your health care provider.   Document Released: 07/13/2005 Document Revised: 06/29/2012 Document Reviewed: 10/28/2011 Elsevier Interactive Patient Education 2016 [employer] Inc[employer]  

## 2016-04-20 NOTE — Progress Notes (Signed)
50 y.o. [id number] MarriedCaucasianF here for annual exam.  Cycles were regular until the last year, since then off and on. Last cycle in April. She did have hot flashes, recently better. Mild night sweats. Some vaginal dryness, using a lubricant which helps. Still slightly uncomfortable. Low libido     Patient's last menstrual period was 10/26/2015 (approximate).          Sexually active: Yes.    The current method of family planning is none.    Exercising: No.  The patient does not participate in regular exercise at present. Smoker:  former  Health Maintenance: Pap:  04-18-15 WNL NEG HR HPV  History of abnormal Pap:  Yes in college, no surgery on her cervix. MMG:  12-16-15 WNL Colonoscopy:  03-04-12 WNL BMD:   Never TDaP:  12-04-11 Gardasil: N/A   reports that she quit smoking about 19 years ago. She has never used smokeless tobacco. She reports that she drinks about 1.2 oz of alcohol per week . She reports that she does not use drugs.  Adopted daughter is 82. She works for a [employer], industrial/product.   Past Medical History:  Diagnosis Date  . Allergy   . Arthritis   . Hypertension     Past Surgical History:  Procedure Laterality Date  . DILATION AND CURETTAGE OF UTERUS  2005, 2006  . INGUINAL HERNIA REPAIR  1975   right    Current Outpatient Prescriptions  Medication Sig Dispense Refill  . hydrochlorothiazide (HYDRODIURIL) 25 MG tablet take 1 tablet by mouth every morning 100 tablet 3   No current facility-administered medications for this visit.     Family History  Problem Relation Age of Onset  . Hypertension      family hx  . Cancer      prostate  . Colon polyps Mother   . Hypertension Mother   . Colon polyps Father   . Cancer Father     Prostate  . Hypertension Father   . Colon polyps Sister 5  . Colon polyps Brother 23  . Stomach cancer Neg Hx   . Diabetes Paternal Grandmother   . Stroke Paternal Grandfather     Review of Systems  Constitutional: Negative.   HENT:  Negative.   Eyes: Negative.   Respiratory: Negative.   Cardiovascular: Negative.   Gastrointestinal: Negative.   Endocrine: Negative.   Genitourinary: Negative.   Musculoskeletal: Negative.   Skin: Negative.   Allergic/Immunologic: Negative.   Neurological: Negative.        Sleep disturbance   Psychiatric/Behavioral: Negative.     Exam:   BP 118/80 (BP Location: Right Arm, Patient Position: Sitting, Cuff Size: Normal)   Pulse 80   Resp 14   Ht 5' 2.5" (1.588 m)   Wt 128 lb (58.1 kg)   LMP 10/26/2015 (Approximate)   BMI 23.04 kg/m   Weight change: @WEIGHTCHANGE @ Height:   Height: 5' 2.5" (158.8 cm)  Ht Readings from Last 3 Encounters:  04/20/16 5' 2.5" (1.588 m)  04/18/15 5' 2.5" (1.588 m)  03/22/15 5\' 3"  (1.6 m)    General appearance: alert, cooperative and appears stated age Head: Normocephalic, without obvious abnormality, atraumatic Neck: no adenopathy, supple, symmetrical, trachea midline and thyroid normal to inspection and palpation Lungs: clear to auscultation bilaterally Breasts: normal appearance, no masses or tenderness Heart: regular rate and rhythm Abdomen: soft, non-tender; bowel sounds normal; no masses,  no organomegaly Extremities: extremities normal, atraumatic, no cyanosis or edema Skin: Skin color, texture, turgor normal.  No rashes or lesions Lymph nodes: Cervical, supraclavicular, and axillary nodes normal. No abnormal inguinal nodes palpated Neurologic: Grossly normal   Pelvic: External genitalia:  no lesions              Urethra:  normal appearing urethra with no masses, tenderness or lesions              Bartholins and Skenes: normal                 Vagina: normal appearing vagina with mild atrophy, no lesions              Cervix: no lesions               Bimanual Exam:  Uterus:  normal size, contour, position, consistency, mobility, non-tender              Adnexa: no mass, fullness, tenderness               Rectovaginal: Confirms                Anus:  normal sphincter tone, no lesions  Chaperone was present for exam.  A:  Well Woman with normal exam  Perimenopausal  Vaginal atrophy  Mild dyspareunia  Vit d Def  Low libido  P:   Screening labs  Great lipids in 5/15, won't check this year  Mammogram just done  Colonoscopy in 2023  Testosterone levels drawn  Discussed low libido, reading list given  Discussed breast self exam  Discussed calcium and vit D intake

## 2016-04-21 ENCOUNTER — Telehealth: Payer: Self-pay | Admitting: *Deleted

## 2016-04-21 LAB — VITAMIN D 25 HYDROXY (VIT D DEFICIENCY, FRACTURES): VIT D 25 HYDROXY: 27 ng/mL — AB (ref 30–100)

## 2016-04-21 NOTE — Telephone Encounter (Signed)
Spoke with patient and gave results- see results note-eh

## 2016-04-21 NOTE — Telephone Encounter (Signed)
Left message to call for lab results-eh  

## 2016-04-21 NOTE — Telephone Encounter (Signed)
-----   Message from Salvadore Dom, MD sent at 04/21/2016  7:33 AM EDT ----- Please tell the patient that her vit d level was mildly low. She should start taking 1,000 IU of vit D daily (long term). Her glucose was normal as long as she wasn't fasting (please confirm). The rest of her blood work was normal.

## 2016-04-22 LAB — TESTOSTERONE, TOTAL, LC/MS/MS: Testosterone, Total, LC-MS-MS: 22 ng/dL (ref 2–45)

## 2016-04-24 LAB — TESTOSTERONE, FREE, LC/MS/MS: TESTOSTERONE, FREEE, LC/MS/MS: 1.2 pg/mL (ref 0.2–5.0)

## 2016-06-11 IMAGING — CT CT ABD-PELV W/ CM
3 of 5 series · 12 of 36 positions shown, 18 images · IV contrast (iopamidol)
Comparison: CT of the chest, abdomen and pelvis 01/24/2008.

CLINICAL DATA: 48-year-old female with right lower quadrant and mid
abdominal pain, as well as right-sided hip pain for 1 year. Prior
history of lithotripsy for kidney stone.

EXAM:
CT ABDOMEN AND PELVIS WITH CONTRAST
TECHNIQUE: Multidetector CT imaging of the abdomen and pelvis was performed
using the standard protocol following bolus administration of
intravenous contrast.
CONTRAST:  100mL 338OQO-6MM IOPAMIDOL (338OQO-6MM) INJECTION 61%

[Series 3: abd/pelvis with · axial · 0.79mm/px · z∈[-310,+0]mm · 7 of 84 slices shown, 12 images]
[im 11/84  soft-tissue]
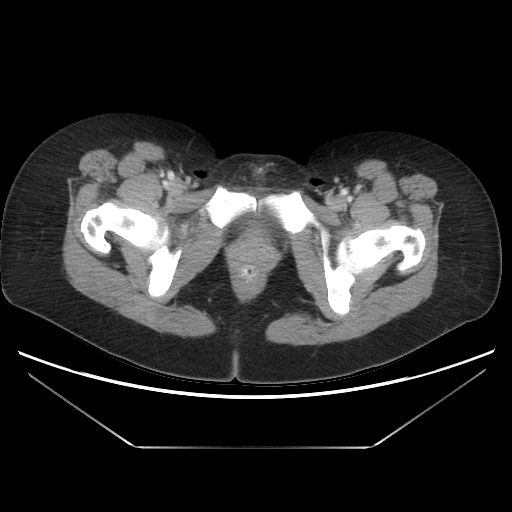
[im 11/84  bone]
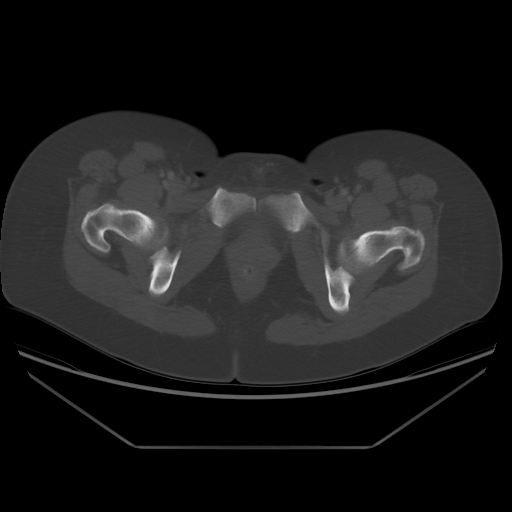
[im 21/84  soft-tissue]
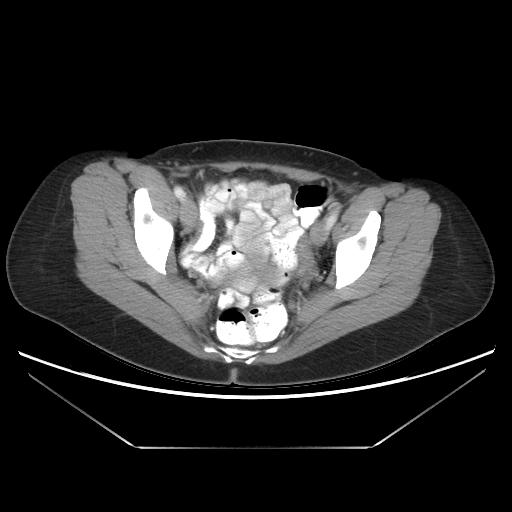
[im 32/84  soft-tissue]
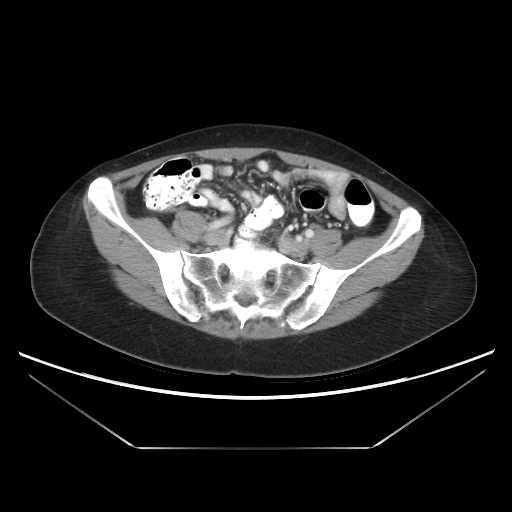
[im 42/84  soft-tissue]
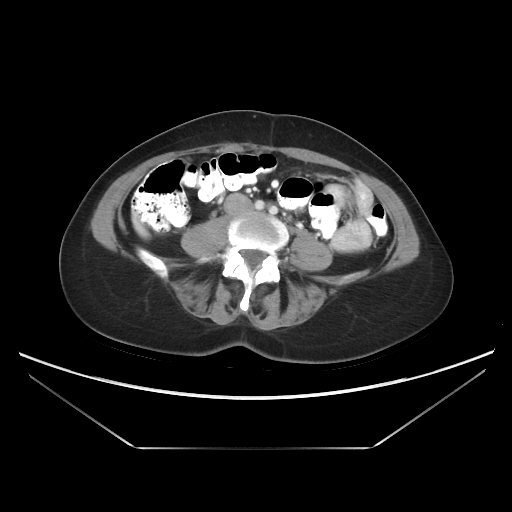
[im 42/84  lung]
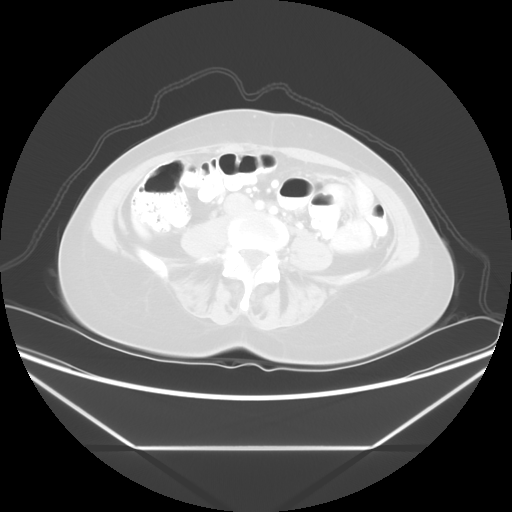
[im 52/84  soft-tissue]
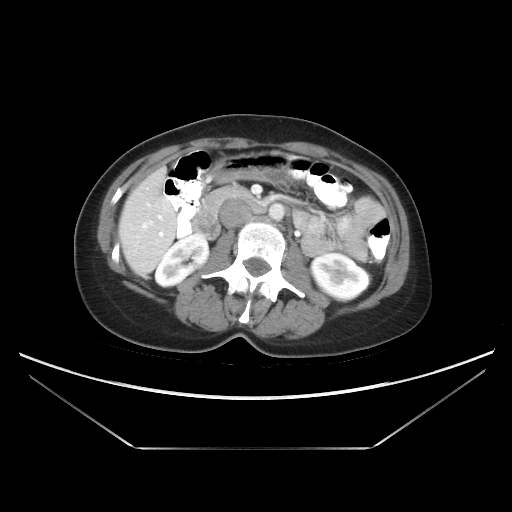
[im 52/84  lung]
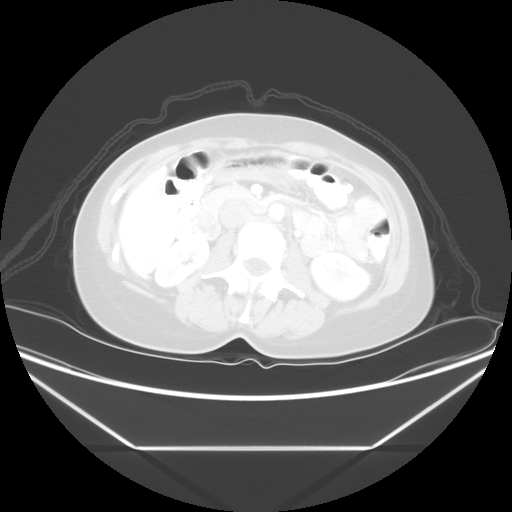
[im 63/84  soft-tissue]
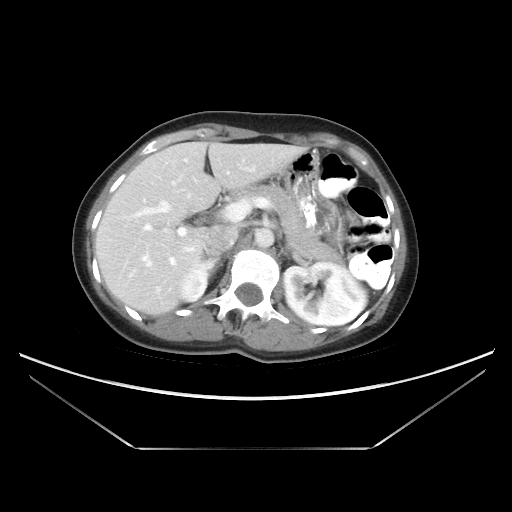
[im 63/84  lung]
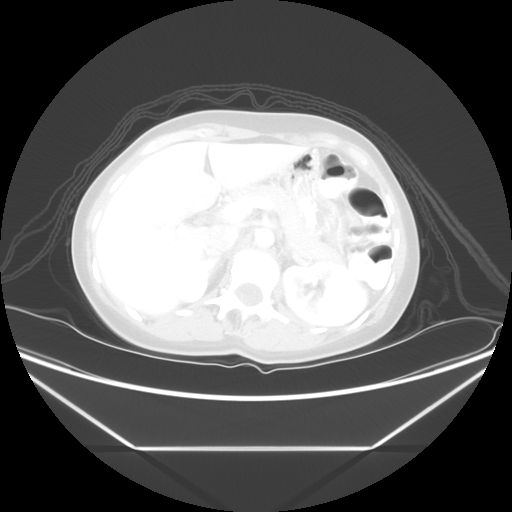
[im 73/84  soft-tissue]
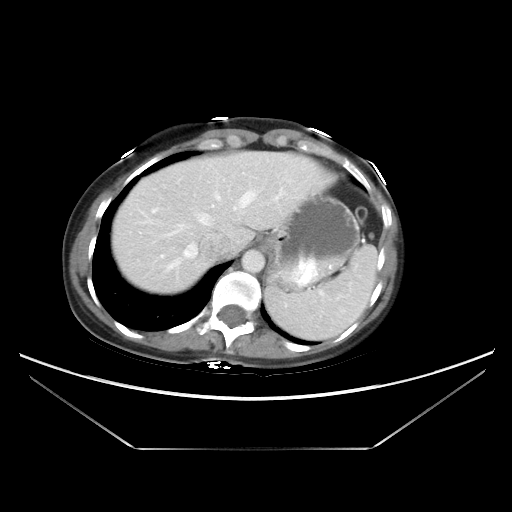
[im 73/84  lung]
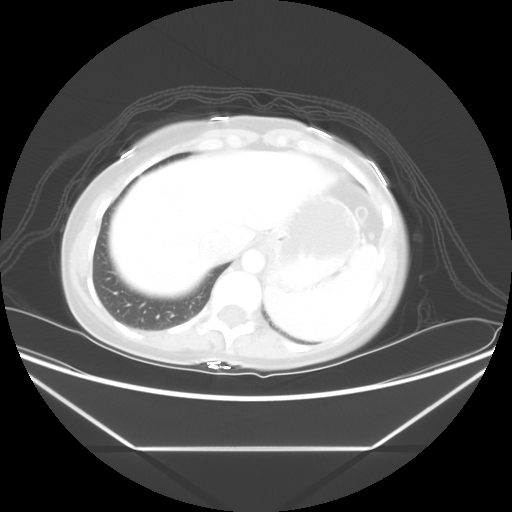

[Series 601: coronal body · coronal · 0.83mm/px · 1 of 102 slices shown, 2 images]
[im 34/102  soft-tissue]
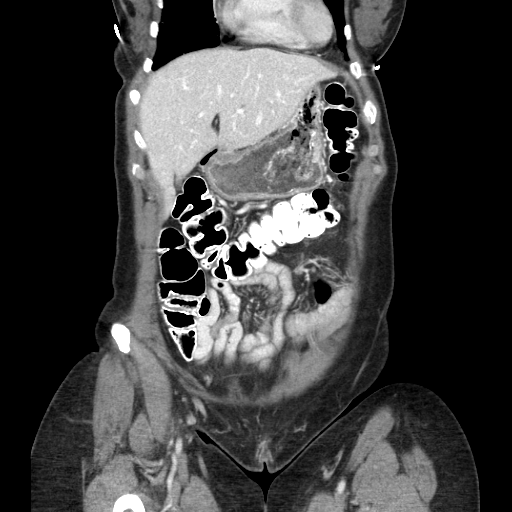
[im 34/102  bone]
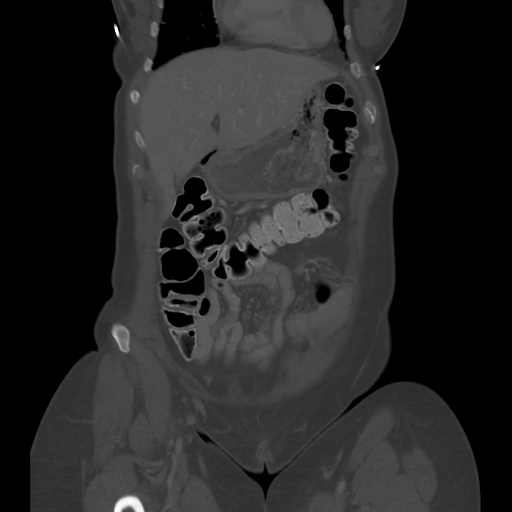

[Series 602: sagittal body · sagittal · 0.83mm/px · 4 of 160 slices shown]
[im 20/160  soft-tissue]
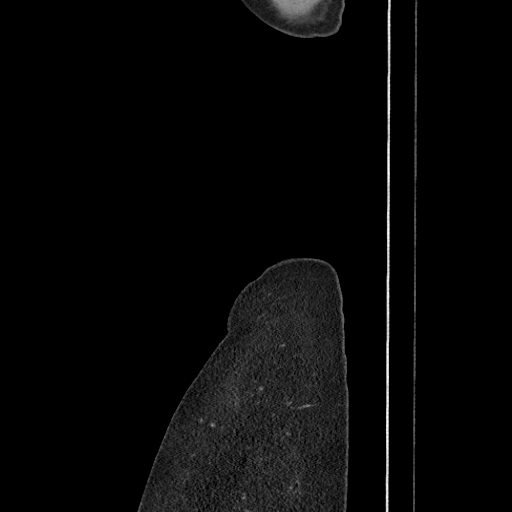
[im 40/160  soft-tissue]
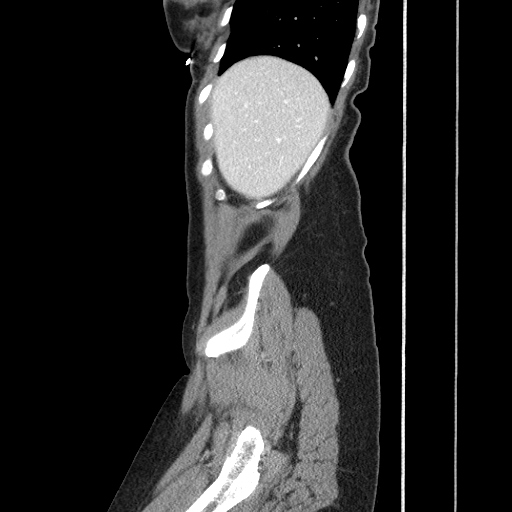
[im 60/160  soft-tissue]
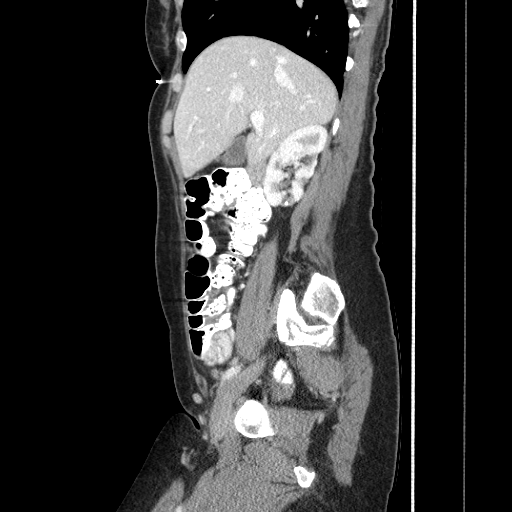
[im 80/160  soft-tissue]
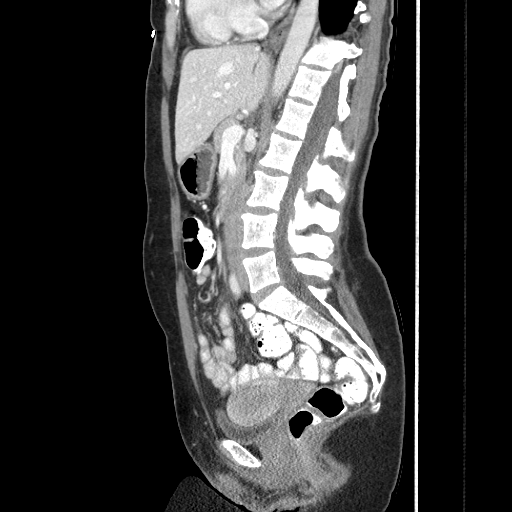

[12 of 36 positions shown; findings below may reference images not displayed]

FINDINGS: Lower chest:  Unremarkable.

Hepatobiliary: Sub cm low-attenuation lesions in segments 2 and 4 of
the liver are too small to definitively characterize, but are
favored to represent tiny cysts. No other larger more suspicious
appearing hepatic lesions are noted. No intra or extrahepatic
biliary ductal dilatation. Gallbladder is normal in appearance.

Pancreas: No pancreatic mass. No pancreatic ductal dilatation. No
pancreatic or peripancreatic fluid or inflammatory changes.

Spleen: Unremarkable.

Adrenals/Urinary Tract: There is severe multifocal cortical thinning
in the right kidney, compatible with chronic scarring. 8 mm low
attenuation lesion in the upper pole of the right kidney medially
appears to represent a small simple cyst. Several tiny
nonobstructive calculi are noted within the right renal collecting
system, including a cluster of small stones in the lower pole,
largest of which measures 3 mm. No hydroureteronephrosis or
perinephric stranding to indicate urinary tract obstruction at this
time. The appearance of the urinary bladder is normal.

Stomach/Bowel: The appearance of the stomach is normal. No
pathologic dilatation of small bowel or colon. No focal areas of
mural thickening or surrounding inflammatory changes associated with
either the small bowel or colon. Appendix measures 4 mm in diameter,
and demonstrates no surrounding inflammatory changes (no oral
contrast enters the appendix despite contrast in the cecum,
nonspecific).

Vascular/Lymphatic: No significant atherosclerotic disease, aneurysm
or dissection identified in the abdominal or pelvic vasculature. No
lymphadenopathy noted in the abdomen or pelvis.

Reproductive: Uterus and ovaries are unremarkable in appearance.

Other: No significant volume of ascites.  No pneumoperitoneum.

Musculoskeletal: Visualized portions of the right proximal femur and
the right hip joint are grossly unremarkable in appearance. There
are no aggressive appearing lytic or blastic lesions noted in the
visualized portions of the skeleton.
IMPRESSION: 1. No acute findings in the abdomen or pelvis to account for the
patient's symptoms.
2. Extensive scarring in the right kidney. Multiple tiny
nonobstructive calculi are present within the right renal collecting
system, largest of which measures only 3 mm in the lower pole. No
ureteral stones or findings of urinary tract obstruction are noted
at this time.
3. Right hip joint is grossly normal in appearance.
4. Additional incidental findings, as above.

## 2016-06-17 ENCOUNTER — Telehealth: Payer: Self-pay | Admitting: Family Medicine

## 2016-06-17 MED ORDER — HYDROCODONE-HOMATROPINE 5-1.5 MG/5ML PO SYRP: 2.5000 mL | ORAL_SOLUTION | Freq: Every evening | ORAL | 0 refills | Status: DC | PRN

## 2016-06-17 NOTE — Telephone Encounter (Signed)
Please check with provider and follow-up with patient

## 2016-06-17 NOTE — Telephone Encounter (Signed)
Spoke with pt's husband and gave recommendations. He will come and pick up rx today. Nothing further needed at this time.

## 2016-06-17 NOTE — Telephone Encounter (Signed)
[facility] - Client [place] Patient Name: Grace [name] DOB: 12/04/1965 Initial Comment Caller states wife has cold symptoms, she has had high BP (on diuretic low dose now), and he would like to know what to do. He called last night and got a call back this morning. Leave message. Nurse Assessment Nurse: Markus Daft, RN, Sherre Poot Date/Time (Eastern Time): 06/17/2016 10:06:17 AM Confirm and document reason for call. If symptomatic, describe symptoms. You must click the next button to save text entered. ---Caller states wife has cold symptoms, up all night with nasal congestion, and taking Nyquil cold/flu. She is able to rest with that, fell asleep. He is not with her now. H/o HTN and ok to take? Does the patient have any new or worsening symptoms? ---Yes Will a triage be completed? ---No Select reason for no triage. ---Client directive prohibits Please document clinical information provided and list any resource used. ---RN advised would msg MD and see if ok to take with h/o HTN. Office: Please call them back today. Guidelines Guideline Title Affirmed Question Affirmed Notes Final Disposition User Clinical Call [place], RN, [employer]

## 2016-06-17 NOTE — Telephone Encounter (Signed)
Per Dr Sherren Mocha pt can take Tylenol, increase fluid intake, and use Hydromet as needed at bedtime for cough.   LMTCB for pt

## 2016-06-17 NOTE — Addendum Note (Signed)
Addended by: Wynn Banker H on: 06/17/2016 12:11 PM   Modules accepted: Orders

## 2016-06-29 ENCOUNTER — Other Ambulatory Visit: Payer: Self-pay

## 2016-06-29 MED ORDER — HYDROCHLOROTHIAZIDE 25 MG PO TABS: 25.0000 mg | ORAL_TABLET | Freq: Every morning | ORAL | 0 refills | Status: DC

## 2016-08-22 ENCOUNTER — Other Ambulatory Visit: Payer: Self-pay | Admitting: Family Medicine

## 2016-09-03 ENCOUNTER — Telehealth: Payer: Self-pay | Admitting: Vascular Surgery

## 2016-09-03 NOTE — Telephone Encounter (Signed)
Opened in error

## 2016-10-02 ENCOUNTER — Encounter: Payer: Self-pay | Admitting: Vascular Surgery

## 2016-10-12 ENCOUNTER — Other Ambulatory Visit: Payer: Self-pay | Admitting: *Deleted

## 2016-10-12 ENCOUNTER — Other Ambulatory Visit: Payer: Self-pay

## 2016-10-12 DIAGNOSIS — I83813 Varicose veins of bilateral lower extremities with pain: Secondary | ICD-10-CM

## 2016-10-12 DIAGNOSIS — I83893 Varicose veins of bilateral lower extremities with other complications: Secondary | ICD-10-CM

## 2016-10-13 ENCOUNTER — Encounter: Payer: Self-pay | Admitting: Vascular Surgery

## 2016-10-13 ENCOUNTER — Ambulatory Visit (INDEPENDENT_AMBULATORY_CARE_PROVIDER_SITE_OTHER): Payer: 59 | Admitting: Vascular Surgery

## 2016-10-13 ENCOUNTER — Ambulatory Visit (HOSPITAL_COMMUNITY)
Admission: RE | Admit: 2016-10-13 | Discharge: 2016-10-13 | Disposition: A | Discharge status: Home / Self Care | Payer: 59 | Source: Ambulatory Visit | Attending: Vascular Surgery | Admitting: Vascular Surgery

## 2016-10-13 VITALS — BP 137/90 | HR 78 | Temp 98.0°F | Resp 16 | Ht 63.0 in | Wt 125.0 lb

## 2016-10-13 DIAGNOSIS — I83893 Varicose veins of bilateral lower extremities with other complications: Secondary | ICD-10-CM | POA: Diagnosis not present

## 2016-10-13 DIAGNOSIS — I83892 Varicose veins of left lower extremities with other complications: Secondary | ICD-10-CM

## 2016-10-13 NOTE — Progress Notes (Signed)
V[facility] and [facility]in Specialist of [facility]  Patient name: ARMINTA GAMM MRN: [id number] DOB: Apr 04, 1966 Sex: female  REASON FOR CONSULT: Evaluation for painful varicosities left leg  HPI: Grace [name] is a 51 y.o. female, who is seen today for evaluation of painful varicosities in her left leg. She is status post left great saphenous vein ablation by Dr. Kellie Simmering in 2014. She had good relief of her symptoms. Over the past year she has had new onset of discomfort over varicosities in the anterior medial thigh extending down to her medial calf. She fortunately has had no skin changes or ulceration. She does have some swelling associated with this. This is more pronounced if she is standing for a great period of time. No history of DVT. Past medical history is otherwise unchanged as outlined below  Past Medical History:  Diagnosis Date  . Allergy   . Arthritis   . Hypertension     Family History  Problem Relation Age of Onset  . Colon polyps Mother   . Hypertension Mother   . Colon polyps Father   . Cancer Father     Prostate  . Hypertension Father   . Colon polyps Sister 76  . Colon polyps Brother 18  . Diabetes Paternal Grandmother   . Stroke Paternal Grandfather   . Hypertension      family hx  . Cancer      prostate  . Stomach cancer Neg Hx     SOCIAL HISTORY: Social History   Social History  . Marital status: Married    Spouse name: N/A  . Number of children: N/A  . Years of education: N/A   Occupational History  . Not on file.   Social History Main Topics  . Smoking status: Former Smoker    Quit date: 01/21/1997  . Smokeless tobacco: Never Used  . Alcohol use 1.2 oz/week    2 Glasses of wine per week  . Drug use: No  . Sexual activity: Yes    Partners: Male    Birth control/ protection: None   Other Topics Concern  . Not on file   Social History Narrative  . No narrative on file    No Known Allergies  Current  Outpatient Prescriptions  Medication Sig Dispense Refill  . hydrochlorothiazide (HYDRODIURIL) 25 MG tablet TAKE 1 TABLET BY MOUTH  EVERY MORNING 90 tablet 4  . Estradiol 10 MCG TABS vaginal tablet Place 1 tablet (10 mcg total) vaginally 2 (two) times a week. (Patient not taking: Reported on 10/13/2016) 24 tablet 3  . HYDROcodone-homatropine (HYCODAN) 5-1.5 MG/5ML syrup Take 2.5-5 mLs by mouth at bedtime as needed for cough. (Patient not taking: Reported on 10/13/2016) 120 mL 0   No current facility-administered medications for this visit.     REVIEW OF SYSTEMS:  [X]  denotes positive finding, [ ]  denotes negative finding Cardiac  Comments:  Chest pain or chest pressure:    Shortness of breath upon exertion:    Short of breath when lying flat:    Irregular heart rhythm:        Vascular    Pain in calf, thigh, or hip brought on by ambulation: x Related to varicosities   Pain in feet at night that wakes you up from your sleep:     Blood clot in your veins:    Leg swelling:  x       Pulmonary    Oxygen at home:    Productive cough:  Wheezing:         Neurologic    Sudden weakness in arms or legs:     Sudden numbness in arms or legs:     Sudden onset of difficulty speaking or slurred speech:    Temporary loss of vision in one eye:     Problems with dizziness:         Gastrointestinal    Blood in stool:     Vomited blood:         Genitourinary    Burning when urinating:     Blood in urine:        Psychiatric    Major depression:         Hematologic    Bleeding problems:    Problems with blood clotting too easily:        Skin    Rashes or ulcers:        Constitutional    Fever or chills:      PHYSICAL EXAM: Vitals:   10/13/16 0848  BP: 137/90  Pulse: 78  Resp: 16  Temp: 98 F (36.7 C)  SpO2: 100%  Weight: 125 lb (56.7 kg)  Height: 5\' 3"  (1.6 m)    GENERAL: The patient is a well-nourished female, in no acute distress. The vital signs are documented  above. CARDIOVASCULAR: Palpable dorsalis pedis pulse. Superficial varicosities most noticeable in her medial left thigh in the region of the discomfort that she has had PULMONARY: There is good air exchange  ABDOMEN: Soft and non-tender  MUSCULOSKELETAL: There are no major deformities or cyanosis. NEUROLOGIC: No focal weakness or paresthesias are detected. SKIN: There are no ulcers or rashes noted. PSYCHIATRIC: The patient has a normal affect.  DATA:  Duplex today reveals closure of her great saphenous vein from proximal calf to saphenofemoral junction. She does have a refluxing anterior accessory saphenous vein on the left arising from the saphenofemoral junction and extending to her anterior thigh. The varicosities that are causing her pain arise from the superior  I imaged this myself with SonoSite ultrasound dystocia reflux in these areas with extension into the painful varicosities  MEDICAL ISSUES: Pain associated with varicosities which have developed as a result of a anterior accessory branch on the left causing reflux. I explained that the initial treatment for this will be elevation thigh high compression and ibuprofen for the discomfort. She would be a candidate for ablation of her anterior accessory branch for relief of superficial reflux and venous hypertension. We will discuss this further at her next visit in 3 months   Rosetta Posner, MD [facility] Specialists of [facility] Tel [phone] Pager [phone]

## 2016-12-28 ENCOUNTER — Ambulatory Visit (INDEPENDENT_AMBULATORY_CARE_PROVIDER_SITE_OTHER): Payer: 59

## 2016-12-28 ENCOUNTER — Encounter: Payer: Self-pay | Admitting: Family Medicine

## 2016-12-28 ENCOUNTER — Ambulatory Visit (INDEPENDENT_AMBULATORY_CARE_PROVIDER_SITE_OTHER): Payer: 59 | Admitting: Family Medicine

## 2016-12-28 VITALS — BP 135/88 | HR 80 | Temp 98.2°F | Resp 18 | Ht 63.0 in | Wt 131.4 lb

## 2016-12-28 DIAGNOSIS — Z124 Encounter for screening for malignant neoplasm of cervix: Secondary | ICD-10-CM

## 2016-12-28 DIAGNOSIS — Z113 Encounter for screening for infections with a predominantly sexual mode of transmission: Secondary | ICD-10-CM

## 2016-12-28 DIAGNOSIS — E559 Vitamin D deficiency, unspecified: Secondary | ICD-10-CM | POA: Diagnosis not present

## 2016-12-28 DIAGNOSIS — Z136 Encounter for screening for cardiovascular disorders: Secondary | ICD-10-CM

## 2016-12-28 DIAGNOSIS — Z130 Encounter for screening for diseases of the blood and blood-forming organs and certain disorders involving the immune mechanism: Secondary | ICD-10-CM | POA: Diagnosis not present

## 2016-12-28 DIAGNOSIS — Z1212 Encounter for screening for malignant neoplasm of rectum: Secondary | ICD-10-CM

## 2016-12-28 DIAGNOSIS — R1031 Right lower quadrant pain: Secondary | ICD-10-CM | POA: Diagnosis not present

## 2016-12-28 DIAGNOSIS — I10 Essential (primary) hypertension: Secondary | ICD-10-CM | POA: Diagnosis not present

## 2016-12-28 DIAGNOSIS — Z8719 Personal history of other diseases of the digestive system: Secondary | ICD-10-CM

## 2016-12-28 DIAGNOSIS — R7309 Other abnormal glucose: Secondary | ICD-10-CM | POA: Diagnosis not present

## 2016-12-28 DIAGNOSIS — G8929 Other chronic pain: Secondary | ICD-10-CM | POA: Diagnosis not present

## 2016-12-28 DIAGNOSIS — Z0000 Encounter for general adult medical examination without abnormal findings: Secondary | ICD-10-CM

## 2016-12-28 DIAGNOSIS — F5101 Primary insomnia: Secondary | ICD-10-CM

## 2016-12-28 DIAGNOSIS — Z1383 Encounter for screening for respiratory disorder NEC: Secondary | ICD-10-CM | POA: Diagnosis not present

## 2016-12-28 DIAGNOSIS — Z1389 Encounter for screening for other disorder: Secondary | ICD-10-CM

## 2016-12-28 DIAGNOSIS — Z1329 Encounter for screening for other suspected endocrine disorder: Secondary | ICD-10-CM | POA: Diagnosis not present

## 2016-12-28 DIAGNOSIS — Z1231 Encounter for screening mammogram for malignant neoplasm of breast: Secondary | ICD-10-CM

## 2016-12-28 DIAGNOSIS — M25551 Pain in right hip: Secondary | ICD-10-CM

## 2016-12-28 DIAGNOSIS — M6281 Muscle weakness (generalized): Secondary | ICD-10-CM | POA: Diagnosis not present

## 2016-12-28 DIAGNOSIS — Z1211 Encounter for screening for malignant neoplasm of colon: Secondary | ICD-10-CM

## 2016-12-28 DIAGNOSIS — Z9889 Other specified postprocedural states: Secondary | ICD-10-CM

## 2016-12-28 DIAGNOSIS — Z87442 Personal history of urinary calculi: Secondary | ICD-10-CM

## 2016-12-28 DIAGNOSIS — M1990 Unspecified osteoarthritis, unspecified site: Secondary | ICD-10-CM

## 2016-12-28 LAB — POCT URINALYSIS DIP (MANUAL ENTRY)
Bilirubin, UA: NEGATIVE
Blood, UA: NEGATIVE
Glucose, UA: NEGATIVE mg/dL
Ketones, POC UA: NEGATIVE mg/dL
Nitrite, UA: NEGATIVE
PH UA: 7.5 (ref 5.0–8.0)
PROTEIN UA: NEGATIVE mg/dL
SPEC GRAV UA: 1.015 (ref 1.010–1.025)
UROBILINOGEN UA: 0.2 U/dL

## 2016-12-28 MED ORDER — HYDROCHLOROTHIAZIDE 25 MG PO TABS: 25.0000 mg | ORAL_TABLET | Freq: Every morning | ORAL | 4 refills | Status: DC

## 2016-12-28 MED ORDER — TRAZODONE HCL 50 MG PO TABS: 25.0000 mg | ORAL_TABLET | Freq: Every evening | ORAL | 3 refills | Status: DC | PRN

## 2016-12-28 NOTE — Progress Notes (Addendum)
Subjective:    Patient ID: Grace [name], female    DOB: 12-Jan-1966, 51 y.o.   MRN: [id number] Chief Complaint  Patient presents with  . Annual Exam    No PAP needed  . Medication Refill    Hydrodiuril 25 MG    HPI  Grace [name] is a 50 yo woman here today for a complete physical. Grace [name] was previously [facility] - their records are avail for my review. Sees gyn - Dr. Talbert Nan who records are also eval - last visit there for gyn exam 9 mos prior 04/20/16.  Primary Preventative Screenings: Cervical Cancer: Sees Dr. Talbert Nan, last pap 04/18/15 Family Planning: menses have stopped and hot flashes have resolved. LMP about 10/2014-2017 STI screening: hiv screening done 2010 Breast Cancer: - mammogram done 12/16/15 Colorectal Cancer: colonoscopy 03/04/12 by Dr. Deatra Ina. Repeat in 10 yrs, no polyps. Saw another GI dr as well (woman on [place]) who rec high dose probiotic refrigerated from [facility] which didn't help - was years ago.  Had early colonoscopy due to FHx of polyps.  Tobacco use: Bone Density: Cardiac: Weight/blood sugar: OTC/vit/supp/herbal: 1000u otc vit D as mildly low aqt 27 9 mos prior  Diet/Exercise/EtOH/substances: Dentist/Optho: Immunizations:  Immunization History  Administered Date(s) Administered  . Influenza Whole 04/17/2008, 05/10/2009, 05/13/2010  . Influenza,inj,Quad PF,36+ Mos 04/03/2014  . Td 07/27/2000  . Tdap 12/04/2011   Blood sugar slightly elevated prior but likely patient was not fasting.  Chronic Medical Conditions: Chronic years of RLQ pain radiating to back. Flairs intimently. Has had w/u wihtout finding.  Usually lasts for about 10d of discomfort when Grace [name] has it.  Last episode accompanied by fatigue, nausea, lightheaded, loose bowels, during it.  Grace [name] has a lot of early satiety and bloating with the pain comes on. Last episode ended about a week ago and lasted 10d.   Right hip arthritis - hurts most when Grace [name] initially gets up after sitting.  Over  all inguinal area and lateral.  Joint pain - knees, hands, starting to bother her more than prior. Mat GM with arthirits.  Stiff and aching for things in the mornings.    Not sleeping well - can't fall asleep, trouble waking up.  Has tried melatonin, valertian root, sleepy time teas.  w/o success. Tried tylenol pm but causes a.m. hangover.    Weight gain - not exercising but never had to worry about weight before.    Did not consider HRT with gyn.   Stomach digestion is off. Saw Dr. Collene Mares 04/2015   Did have h/o lithotripsy to treat stones.   Occ constipation, not every very regular.   HTN: out of 2 wks  Past Medical History:  Diagnosis Date  . Allergy   . Arthritis   . Hypertension    Past Surgical History:  Procedure Laterality Date  . DILATION AND CURETTAGE OF UTERUS  2005, 2006  . INGUINAL HERNIA REPAIR  1975   right   Current Outpatient Prescriptions on File Prior to Visit  Medication Sig Dispense Refill  . hydrochlorothiazide (HYDRODIURIL) 25 MG tablet TAKE 1 TABLET BY MOUTH  EVERY MORNING 90 tablet 4  . Estradiol 10 MCG TABS vaginal tablet Place 1 tablet (10 mcg total) vaginally 2 (two) times a week. (Patient not taking: Reported on 10/13/2016) 24 tablet 3  . HYDROcodone-homatropine (HYCODAN) 5-1.5 MG/5ML syrup Take 2.5-5 mLs by mouth at bedtime as needed for cough. (Patient not taking: Reported on 10/13/2016) 120 mL 0   No  current facility-administered medications on file prior to visit.    No Known Allergies Family History  Problem Relation Age of Onset  . Colon polyps Mother   . Hypertension Mother   . Colon polyps Father   . Cancer Father        Prostate  . Hypertension Father   . Colon polyps Sister 16  . Colon polyps Brother 91  . Diabetes Paternal Grandmother   . Stroke Paternal Grandfather   . Hypertension Unknown        family hx  . Cancer Unknown        prostate  . Stomach cancer Neg Hx    Social History   Social History  . Marital status:  Married    Spouse name: N/A  . Number of children: N/A  . Years of education: N/A   Social History Main Topics  . Smoking status: Former Smoker    Quit date: 01/21/1997  . Smokeless tobacco: Never Used  . Alcohol use 1.2 oz/week    2 Glasses of wine per week  . Drug use: No  . Sexual activity: Yes    Partners: Male    Birth control/ protection: None   Other Topics Concern  . None   Social History Narrative  . None   Depression screen [facility] 2/9 12/28/2016  Decreased Interest 0  Down, Depressed, Hopeless 0  PHQ - 2 Score 0    Review of Systems  Constitutional: Positive for activity change (decrease), appetite change (decrease), fatigue and unexpected weight change (increase).  Gastrointestinal: Positive for abdominal distention, abdominal pain and diarrhea.  Genitourinary: Positive for pelvic pain.  Musculoskeletal: Positive for arthralgias and back pain.  Neurological: Positive for light-headedness.  All other systems reviewed and are negative. other than as noted in HPI     Objective:   Physical Exam  Constitutional: Grace [name] is oriented to person, place, and time. Grace [name] appears well-developed and well-nourished. No distress.  HENT:  Head: Normocephalic and atraumatic.  Right Ear: Tympanic membrane, external ear and ear canal normal.  Left Ear: Tympanic membrane, external ear and ear canal normal.  Nose: Nose normal. No mucosal edema or rhinorrhea.  Mouth/Throat: Uvula is midline, oropharynx is clear and moist and mucous membranes are normal. No posterior oropharyngeal erythema.  Eyes: Pupils are equal, round, and reactive to light. Conjunctivae and EOM are normal. Right eye exhibits no discharge. Left eye exhibits no discharge. No scleral icterus.  Neck: Normal range of motion. Neck supple. No thyromegaly present.  Cardiovascular: Normal rate, regular rhythm, normal heart sounds and intact distal pulses.   Pulmonary/Chest: Effort normal and breath sounds normal. No respiratory  distress.  Abdominal: Soft. Normal appearance and bowel sounds are normal. There is no hepatosplenomegaly. There is tenderness in the right lower quadrant. There is no rigidity, no rebound, no guarding and no CVA tenderness. No hernia.  Musculoskeletal: Grace [name] exhibits no edema.       Right hip: Normal.       Left hip: Normal.  Lymphadenopathy:    Grace [name] has no cervical adenopathy.  Neurological: Grace [name] is alert and oriented to person, place, and time. Grace [name] has normal reflexes.  Skin: Skin is warm and dry. Grace [name] is not diaphoretic. No erythema.  Psychiatric: Grace [name] has a normal mood and affect. Her behavior is normal.      BP 135/88 (BP Location: Right Arm, Patient Position: Sitting, Cuff Size: Normal)   Pulse 80   Temp 98.2 F (36.8 C) (Oral)   Resp  18   Ht 5\' 3"  (1.6 m)   Wt 131 lb 6.4 oz (59.6 kg)   SpO2 97%   BMI 23.28 kg/m      Assessment & Plan:  Fasting lipid   1. Annual physical exam   2. Routine screening for STI (sexually transmitted infection)   3. Encounter for screening mammogram for breast cancer   4. Screening for cardiovascular, respiratory, and genitourinary diseases   5. Screening for cervical cancer   6. Screening for colorectal cancer   7. Screening for deficiency anemia   8. Screening for thyroid disorder   9. Essential hypertension   10. Vitamin D deficiency   11. Right hip pain   12. Abdominal pain, chronic, right lower quadrant   13. Arthritis   14. Elevated glucose   15. Primary insomnia   16. History of nephrolithiasis   17. S/P right inguinal hernia repair - as a young child - wonder if scar tissue could be causing her persistent RLQ abd sxs with periodic bloating and diarrhea as well as Rt hip pain. Has had thorough eval through the years with sev different gyn and GI docs as well as normal Rt hip xray here today. CT scan 05/2015 unremarkable. Will refer to general surgery to see if they think there is a possibility of adhesions causing sxs and if it is worth  considering an exploratory laparoscopy considering how miserable and debilitated pt feels with the numerous years of worsening sxs.    Orders Placed This Encounter  Procedures  . DG HIP UNILAT W OR W/O PELVIS 2-3 VIEWS RIGHT    Standing Status:   Future    Number of Occurrences:   1    Standing Expiration Date:   12/28/2017    Order Specific Question:   Reason for Exam (SYMPTOM  OR DIAGNOSIS REQUIRED)    Answer:   chronic stiffness over right inguinal area with some weakness. ttp over lower anterior left bony pelvis    Order Specific Question:   Is the patient pregnant?    Answer:   No    Order Specific Question:   Preferred imaging location?    Answer:   External  . CBC  . Comprehensive metabolic panel    Order Specific Question:   Has the patient fasted?    Answer:   Yes  . TSH  . Sedimentation Rate  . C-reactive protein  . Hemoglobin A1c  . Hemoglobin A1c  . Ambulatory referral to General Surgery    Referral Priority:   Routine    Referral Type:   Surgical    Referral Reason:   Specialty Services Required    Requested Specialty:   General Surgery    Number of Visits Requested:   1  . POCT urinalysis dipstick    Meds ordered this encounter  Medications  . hydrochlorothiazide (HYDRODIURIL) 25 MG tablet    Sig: Take 1 tablet (25 mg total) by mouth every morning.    Dispense:  90 tablet    Refill:  4  . traZODone (DESYREL) 50 MG tablet    Sig: Take 0.5-1 tablets (25-50 mg total) by mouth at bedtime as needed for sleep.    Dispense:  30 tablet    Refill:  3     Delman Cheadle, M.D.  Primary Care at [facility] [address] [place], [place] [address] [phone] phone [phone] fax  12/30/16 11:55 PM  Results for orders placed or performed in visit on  12/28/16  CBC  Result Value Ref Range   WBC 5.3 3.4 - 10.8 x10E3/uL   RBC 4.46 3.77 - 5.28 x10E6/uL   Hemoglobin 13.2 11.1 - 15.9 g/dL   Hematocrit 40.1 34.0 - 46.6 %   MCV 90 79 - 97 fL   MCH 29.6  26.6 - 33.0 pg   MCHC 32.9 31.5 - 35.7 g/dL   RDW 14.0 12.3 - 15.4 %   Platelets 321 150 - 379 x10E3/uL  Comprehensive metabolic panel  Result Value Ref Range   Glucose 85 65 - 99 mg/dL   BUN 17 6 - 24 mg/dL   Creatinine, Ser 0.71 0.57 - 1.00 mg/dL   GFR calc non Af Amer 100 >59 mL/min/1.73   GFR calc Af Amer 115 >59 mL/min/1.73   BUN/Creatinine Ratio 24 (H) 9 - 23   Sodium 142 134 - 144 mmol/L   Potassium 4.0 3.5 - 5.2 mmol/L   Chloride 101 96 - 106 mmol/L   CO2 28 18 - 29 mmol/L   Calcium 9.6 8.7 - 10.2 mg/dL   Total Protein 7.2 6.0 - 8.5 g/dL   Albumin 4.7 3.5 - 5.5 g/dL   Globulin, Total 2.5 1.5 - 4.5 g/dL   Albumin/Globulin Ratio 1.9 1.2 - 2.2   Bilirubin Total 0.5 0.0 - 1.2 mg/dL   Alkaline Phosphatase 47 39 - 117 IU/L   AST 14 0 - 40 IU/L   ALT 12 0 - 32 IU/L  TSH  Result Value Ref Range   TSH 1.440 0.450 - 4.500 uIU/mL  Sedimentation Rate  Result Value Ref Range   Sed Rate 2 0 - 40 mm/hr  C-reactive protein  Result Value Ref Range   CRP 1.0 0.0 - 4.9 mg/L  Hemoglobin A1c  Result Value Ref Range   Hgb A1c MFr Bld 5.4 4.8 - 5.6 %   Est. average glucose Bld gHb Est-mCnc 108 mg/dL  POCT urinalysis dipstick  Result Value Ref Range   Color, UA yellow yellow   Clarity, UA clear clear   Glucose, UA negative negative mg/dL   Bilirubin, UA negative negative   Ketones, POC UA negative negative mg/dL   Spec Grav, UA 1.015 1.010 - 1.025   Blood, UA negative negative   pH, UA 7.5 5.0 - 8.0   Protein Ur, POC negative negative mg/dL   Urobilinogen, UA 0.2 0.2 or 1.0 E.U./dL   Nitrite, UA Negative Negative   Leukocytes, UA Trace (A) Negative

## 2016-12-28 NOTE — Patient Instructions (Addendum)
Start a daily fiber supplement like psyllium husk/Metamucil. Consider trial of the daily joint supplement like glucosamine-chondroitin or tumeric/curcumin/black pepper. Start exercising 4 days a week or I'm going to put you on a lot of pills. (that's a threat).   We recommend that you schedule a mammogram for breast cancer screening. Typically, you do not need a referral to do this. Please contact a local imaging center to schedule your mammogram.  [facility] - [phone]  *ask for the Radiology Department [facility] ([place]) - [phone] or [phone]  [facility] - [phone] [place] [phone] [facility] - [phone]  *ask for the [facility] - [phone]  *ask for the Radiology Department [facility] - [phone]  *ask for the [place] - [phone]  IF you received an x-ray today, you will receive an invoice from [facility]. Please contact [facility] at [phone] with questions or concerns regarding your invoice.   IF you received labwork today, you will receive an invoice from [facility]. Please contact [facility] at [phone] with questions or concerns regarding your invoice.   Our billing staff will not be able to assist you with questions regarding bills from these companies.  You will be contacted with the lab results as soon as they are available. The fastest way to get your results is to activate your My Chart account. Instructions are located on the last page of this paperwork. If you have not heard from [place] regarding the results in 2 weeks, please contact this office.    Insomnia Insomnia is a sleep disorder that makes it difficult to fall asleep or to stay asleep. Insomnia can cause tiredness (fatigue), low energy, difficulty concentrating, mood swings, and poor performance at  work or school. There are three different ways to classify insomnia:  Difficulty falling asleep.  Difficulty staying asleep.  Waking up too early in the morning.  Any type of insomnia can be long-term (chronic) or short-term (acute). Both are common. Short-term insomnia usually lasts for three months or less. Chronic insomnia occurs at least three times a week for longer than three months. What are the causes? Insomnia may be caused by another condition, situation, or substance, such as:  Anxiety.  Certain medicines.  Gastroesophageal reflux disease (GERD) or other gastrointestinal conditions.  Asthma or other breathing conditions.  Restless legs syndrome, sleep apnea, or other sleep disorders.  Chronic pain.  Menopause. This may include hot flashes.  Stroke.  Abuse of alcohol, tobacco, or illegal drugs.  Depression.  Caffeine.  Neurological disorders, such as Alzheimer disease.  An overactive thyroid (hyperthyroidism).  The cause of insomnia may not be known. What increases the risk? Risk factors for insomnia include:  Gender. Women are more commonly affected than men.  Age. Insomnia is more common as you get older.  Stress. This may involve your professional or personal life.  Income. Insomnia is more common in people with lower income.  Lack of exercise.  Irregular work schedule or night shifts.  Traveling between different time zones.  What are the signs or symptoms? If you have insomnia, trouble falling asleep or trouble staying asleep is the main symptom. This may lead to other symptoms, such as:  Feeling fatigued.  Feeling nervous about going to sleep.  Not feeling rested in the morning.  Having trouble concentrating.  Feeling irritable, anxious, or depressed.  How is this treated? Treatment  for insomnia depends on the cause. If your insomnia is caused by an underlying condition, treatment will focus on addressing the condition. Treatment  may also include:  Medicines to help you sleep.  Counseling or therapy.  Lifestyle adjustments.  Follow these instructions at home:  Take medicines only as directed by your health care provider.  Keep regular sleeping and waking hours. Avoid naps.  Keep a sleep diary to help you and your health care provider figure out what could be causing your insomnia. Include: ? When you sleep. ? When you wake up during the night. ? How well you sleep. ? How rested you feel the next day. ? Any side effects of medicines you are taking. ? What you eat and drink.  Make your bedroom a comfortable place where it is easy to fall asleep: ? Put up shades or special blackout curtains to block light from outside. ? Use a white noise machine to block noise. ? Keep the temperature cool.  Exercise regularly as directed by your health care provider. Avoid exercising right before bedtime.  Use relaxation techniques to manage stress. Ask your health care provider to suggest some techniques that may work well for you. These may include: ? Breathing exercises. ? Routines to release muscle tension. ? Visualizing peaceful scenes.  Cut back on alcohol, caffeinated beverages, and cigarettes, especially close to bedtime. These can disrupt your sleep.  Do not overeat or eat spicy foods right before bedtime. This can lead to digestive discomfort that can make it hard for you to sleep.  Limit screen use before bedtime. This includes: ? Watching TV. ? Using your smartphone, tablet, and computer.  Stick to a routine. This can help you fall asleep faster. Try to do a quiet activity, brush your teeth, and go to bed at the same time each night.  Get out of bed if you are still awake after 15 minutes of trying to sleep. Keep the lights down, but try reading or doing a quiet activity. When you feel sleepy, go back to bed.  Make sure that you drive carefully. Avoid driving if you feel very sleepy.  Keep all  follow-up appointments as directed by your health care provider. This is important. Contact a health care provider if:  You are tired throughout the day or have trouble in your daily routine due to sleepiness.  You continue to have sleep problems or your sleep problems get worse. Get help right away if:  You have serious thoughts about hurting yourself or someone else. This information is not intended to replace advice given to you by your health care provider. Make sure you discuss any questions you have with your health care provider. Document Released: 07/10/2000 Document Revised: 12/13/2015 Document Reviewed: 04/13/2014 Elsevier Interactive Patient Education  2018 [employer].  Osteoarthritis Osteoarthritis is a type of arthritis that affects tissue that covers the ends of bones in joints (cartilage). Cartilage acts as a cushion between the bones and helps them move smoothly. Osteoarthritis results when cartilage in the joints gets worn down. Osteoarthritis is sometimes called "wear and tear" arthritis. Osteoarthritis is the most common form of arthritis. It often occurs in older people. It is a condition that gets worse over time (a progressive condition). Joints that are most often affected by this condition are in:  Fingers.  Toes.  Hips.  Knees.  Spine, including neck and lower back.  What are the causes? This condition is caused by age-related wearing down of cartilage that covers  the ends of bones. What increases the risk? The following factors may make you more likely to develop this condition:  Older age.  Being overweight or obese.  Overuse of joints, such as in athletes.  Past injury of a joint.  Past surgery on a joint.  Family history of osteoarthritis.  What are the signs or symptoms? The main symptoms of this condition are pain, swelling, and stiffness in the joint. The joint may lose its shape over time. Small pieces of bone or cartilage may break off  and float inside of the joint, which may cause more pain and damage to the joint. Small deposits of bone (osteophytes) may grow on the edges of the joint. Other symptoms may include:  A grating or scraping feeling inside the joint when you move it.  Popping or creaking sounds when you move.  Symptoms may affect one or more joints. Osteoarthritis in a major joint, such as your knee or hip, can make it painful to walk or exercise. If you have osteoarthritis in your hands, you might not be able to grip items, twist your hand, or control small movements of your hands and fingers (fine motor skills). How is this diagnosed? This condition may be diagnosed based on:  Your medical history.  A physical exam.  Your symptoms.  X-rays of the affected joint(s).  Blood tests to rule out other types of arthritis.  How is this treated? There is no cure for this condition, but treatment can help to control pain and improve joint function. Treatment plans may include:  A prescribed exercise program that allows for rest and joint relief. You may work with a physical therapist.  A weight control plan.  Pain relief techniques, such as: ? Applying heat and cold to the joint. ? Electric pulses delivered to nerve endings under the skin (transcutaneous electrical nerve stimulation, or TENS). ? Massage. ? Certain nutritional supplements.  NSAIDs or prescription medicines to help relieve pain.  Medicine to help relieve pain and inflammation (corticosteroids). This can be given by mouth (orally) or as an injection.  Assistive devices, such as a brace, wrap, splint, specialized glove, or cane.  Surgery, such as: ? An osteotomy. This is done to reposition the bones and relieve pain or to remove loose pieces of bone and cartilage. ? Joint replacement surgery. You may need this surgery if you have very bad (advanced) osteoarthritis.  Follow these instructions at home: Activity  Rest your affected  joints as directed by your health care provider.  Do not drive or use heavy machinery while taking prescription pain medicine.  Exercise as directed. Your health care provider or physical therapist may recommend specific types of exercise, such as: ? Strengthening exercises. These are done to strengthen the muscles that support joints that are affected by arthritis. They can be performed with weights or with exercise bands to add resistance. ? Aerobic activities. These are exercises, such as brisk walking or water aerobics, that get your heart pumping. ? Range-of-motion activities. These keep your joints easy to move. ? Balance and agility exercises. Managing pain, stiffness, and swelling  If directed, apply heat to the affected area as often as told by your health care provider. Use the heat source that your health care provider recommends, such as a moist heat pack or a heating pad. ? If you have a removable assistive device, remove it as told by your health care provider. ? Place a towel between your skin and the heat source. If  your health care provider tells you to keep the assistive device on while you apply heat, place a towel between the assistive device and the heat source. ? Leave the heat on for 20-30 minutes. ? Remove the heat if your skin turns bright red. This is especially important if you are unable to feel pain, heat, or cold. You may have a greater risk of getting burned.  If directed, put ice on the affected joint: ? If you have a removable assistive device, remove it as told by your health care provider. ? Put ice in a plastic bag. ? Place a towel between your skin and the bag. If your health care provider tells you to keep the assistive device on during icing, place a towel between the assistive device and the bag. ? Leave the ice on for 20 minutes, 2-3 times a day. General instructions  Take over-the-counter and prescription medicines only as told by your health care  provider.  Maintain a healthy weight. Follow instructions from your health care provider for weight control. These may include dietary restrictions.  Do not use any products that contain nicotine or tobacco, such as cigarettes and e-cigarettes. These can delay bone healing. If you need help quitting, ask your health care provider.  Use assistive devices as directed by your health care provider.  Keep all follow-up visits as told by your health care provider. This is important. Where to find more information:  [employer] of Arthritis and Musculoskeletal and Skin Diseases: www.niams.SouthExposed.es  [employer] on Aging: http://kim-miller.com/  American College of Rheumatology: www.rheumatology.org Contact a health care provider if:  Your skin turns red.  You develop a rash.  You have pain that gets worse.  You have a fever along with joint or muscle aches. Get help right away if:  You lose a lot of weight.  You suddenly lose your appetite.  You have night sweats. Summary  Osteoarthritis is a type of arthritis that affects tissue covering the ends of bones in joints (cartilage).  This condition is caused by age-related wearing down of cartilage that covers the ends of bones.  The main symptom of this condition is pain, swelling, and stiffness in the joint.  There is no cure for this condition, but treatment can help to control pain and improve joint function. This information is not intended to replace advice given to you by your health care provider. Make sure you discuss any questions you have with your health care provider. Document Released: 07/13/2005 Document Revised: 03/16/2016 Document Reviewed: 03/16/2016 Elsevier Interactive Patient Education  2018 [employer].  What You Need to Know About Osteoarthritis Osteoarthritis is a type of arthritis that affects tissue that covers the ends of bones in joints (cartilage). Cartilage acts as a cushion between the bones  and helps them move smoothly. Osteoarthritis results when cartilage in the joints gets worn down. Osteoarthritis is sometimes called "wear and tear" arthritis. Osteoarthritis can affect any joint and can make movement painful. Hips, knees, fingers, and toes are some of the joints that are most often affected by osteoarthritis. You may be more likely to develop osteoarthritis if:  You are middle-aged or older.  You are obese.  You have injured a joint or had surgery on a joint.  You have a family history of osteoarthritis.  How can osteoarthritis affect me? Osteoarthritis can cause:  Pain and swelling in your joint.  Difficulty moving your joint.  A grating or scraping feeling inside the joint when you move  it.  Popping or creaking sounds when you move.  This condition can make it harder to do things that you need or want to do each day. Osteoarthritis in a major joint, such as your knee or hip, can make it painful to walk or exercise. If you have osteoarthritis in your hands, you might not be able to grip items, twist your hand, or control small movements of your hands and fingers (fine motor skills). Over time, osteoarthritis could cause you to be less physically active. Being less active increases your risk for other long-term (chronic) health problems, such as diabetes and heart disease. What lifestyle changes can be made? You can lessen the impact that osteoarthritis has on your daily life by:  Switching to low-impact activities that do not put repeated pressure on your joints. For example, if you usually run or jog for exercise, try swimming or riding a bike instead.  Staying active. Build up to at least 150 minutes of physical activity each week to keep your joints healthy and keep your body strong.  Losing weight. If you are overweight or obese, losing weight can take pressure off of your joints. If you need help with weight loss, talk with your health care provider or a diet  and nutrition specialist (dietitian).  What other changes can be made? You can also lessen the effect of osteoarthritis by:  Using assistive devices. Sometimes a brace, wrap, splint, specialized glove, or cane can help. Talk with your health care provider or physical therapist about when and how to use these.  Working with a physical therapist who can help you find ways to do your daily activities without harming your joints. A physical therapist can also teach you exercises and stretches to strengthen the muscles that support your joints.  Treating pain and inflammation. Take over-the-counter and prescription medicines for pain and inflammation only as told by your health care provider. If directed, you may put ice on the affected joint: ? If you have a removable assistive device, remove it as told by your health care provider. ? Put ice in a plastic bag. ? Place a towel between your skin and the bag. ? Leave the ice on for 20 minutes, 2-3 times a day.  If other measures do not work, you may need joint surgery, such as joint replacement. What can happen if changes are not made? Osteoarthritis is a condition that gets worse over time (a progressive condition). If you do not take steps to strengthen your body and to slow down the progress of the disease, your condition may get worse more quickly. Your joints may stiffen and become swollen, which will make them painful and hard to move. Where to find more information: You can learn more about osteoarthritis from:  The [place]: www.RadioScam.is  [employer] of Arthritis and Musculoskeletal and Skin Diseases: www.niams.CityPerson.tn  Contact a health care provider if:  You cannot do your normal activities comfortably.  Your joint does not function at all.  Your pain is interfering with your sleep.  You are gaining weight.  Your joint  appears to be changing in shape, instead of just being swollen and sore. Summary  Osteoarthritis is a painful joint disease that gets worse over time.  This condition can lead to other long-term (chronic) health problems.  There are changes that you can make to slow down the progression of the disease. This information is not intended to replace advice given to you by your health care provider. Make  sure you discuss any questions you have with your health care provider. Document Released: 03/03/2016 Document Revised: 03/05/2016 Document Reviewed: 03/03/2016 Elsevier Interactive Patient Education  2018 [employer].

## 2016-12-29 LAB — COMPREHENSIVE METABOLIC PANEL
A/G RATIO: 1.9 (ref 1.2–2.2)
ALT: 12 IU/L (ref 0–32)
AST: 14 IU/L (ref 0–40)
Albumin: 4.7 g/dL (ref 3.5–5.5)
Alkaline Phosphatase: 47 IU/L (ref 39–117)
BUN/Creatinine Ratio: 24 — ABNORMAL HIGH (ref 9–23)
BUN: 17 mg/dL (ref 6–24)
Bilirubin Total: 0.5 mg/dL (ref 0.0–1.2)
CALCIUM: 9.6 mg/dL (ref 8.7–10.2)
CO2: 28 mmol/L (ref 18–29)
Chloride: 101 mmol/L (ref 96–106)
Creatinine, Ser: 0.71 mg/dL (ref 0.57–1.00)
GFR, EST AFRICAN AMERICAN: 115 mL/min/{1.73_m2} (ref >59)
GFR, EST NON AFRICAN AMERICAN: 100 mL/min/{1.73_m2} (ref >59)
GLOBULIN, TOTAL: 2.5 g/dL (ref 1.5–4.5)
Glucose: 85 mg/dL (ref 65–99)
POTASSIUM: 4 mmol/L (ref 3.5–5.2)
SODIUM: 142 mmol/L (ref 134–144)
Total Protein: 7.2 g/dL (ref 6.0–8.5)

## 2016-12-29 LAB — CBC
HEMATOCRIT: 40.1 % (ref 34.0–46.6)
Hemoglobin: 13.2 g/dL (ref 11.1–15.9)
MCH: 29.6 pg (ref 26.6–33.0)
MCHC: 32.9 g/dL (ref 31.5–35.7)
MCV: 90 fL (ref 79–97)
Platelets: 321 10*3/uL (ref 150–379)
RBC: 4.46 x10E6/uL (ref 3.77–5.28)
RDW: 14 % (ref 12.3–15.4)
WBC: 5.3 10*3/uL (ref 3.4–10.8)

## 2016-12-29 LAB — SEDIMENTATION RATE: SED RATE: 2 mm/h (ref 0–40)

## 2016-12-29 LAB — TSH: TSH: 1.44 u[IU]/mL (ref 0.450–4.500)

## 2016-12-29 LAB — HEMOGLOBIN A1C
Est. average glucose Bld gHb Est-mCnc: 108 mg/dL
Hgb A1c MFr Bld: 5.4 % (ref 4.8–5.6)

## 2016-12-29 LAB — C-REACTIVE PROTEIN: CRP: 1 mg/L (ref 0.0–4.9)

## 2017-01-19 ENCOUNTER — Ambulatory Visit: Payer: 59 | Admitting: Vascular Surgery

## 2017-01-26 ENCOUNTER — Ambulatory Visit: Payer: 59 | Admitting: Vascular Surgery

## 2017-01-26 ENCOUNTER — Other Ambulatory Visit: Payer: Self-pay | Admitting: Obstetrics and Gynecology

## 2017-01-26 DIAGNOSIS — Z1231 Encounter for screening mammogram for malignant neoplasm of breast: Secondary | ICD-10-CM

## 2017-02-15 ENCOUNTER — Encounter: Payer: Self-pay | Admitting: Vascular Surgery

## 2017-02-22 ENCOUNTER — Ambulatory Visit: Payer: 59

## 2017-02-23 ENCOUNTER — Ambulatory Visit (INDEPENDENT_AMBULATORY_CARE_PROVIDER_SITE_OTHER): Payer: 59 | Admitting: Vascular Surgery

## 2017-02-23 ENCOUNTER — Encounter: Payer: Self-pay | Admitting: Vascular Surgery

## 2017-02-23 VITALS — BP 120/87 | HR 73 | Resp 18 | Ht 63.0 in | Wt 130.3 lb

## 2017-02-23 DIAGNOSIS — I83892 Varicose veins of left lower extremities with other complications: Secondary | ICD-10-CM | POA: Diagnosis not present

## 2017-02-23 NOTE — Progress Notes (Signed)
Vascular and Vein Specialist of [facility]  Patient name: Grace [name] MRN: [id number] DOB: 03/23/1966 Sex: female  REASON FOR VISIT: Full left leg varicosities  HPI: Grace [name] is a 51 y.o. female here today for follow-up. She has been compliant with elevation and compression regarding of varicosities in her left leg. These extend over her medial thigh and calf. She has pain specifically over the varicosities and also has achy sensation in her left leg at the end of a day of standing. She is quite active and her work and also her recreational activities and this is limiting her enjoyment. She portion is had no skin changes. She does have swelling and is thereforeCEAP3  Past Medical History:  Diagnosis Date  . Allergy   . Arthritis   . Hypertension     Family History  Problem Relation Age of Onset  . Colon polyps Mother   . Hypertension Mother   . Colon polyps Father   . Cancer Father        Prostate  . Hypertension Father   . Colon polyps Sister 92  . Colon polyps Brother 47  . Diabetes Paternal Grandmother   . Stroke Paternal Grandfather   . Hypertension Unknown        family hx  . Cancer Unknown        prostate  . Stomach cancer Neg Hx     SOCIAL HISTORY: Social History  Substance Use Topics  . Smoking status: Former Smoker    Quit date: 01/21/1997  . Smokeless tobacco: Never Used  . Alcohol use 1.2 oz/week    2 Glasses of wine per week    No Known Allergies  Current Outpatient Prescriptions  Medication Sig Dispense Refill  . hydrochlorothiazide (HYDRODIURIL) 25 MG tablet Take 1 tablet (25 mg total) by mouth every morning. 90 tablet 4  . traZODone (DESYREL) 50 MG tablet Take 0.5-1 tablets (25-50 mg total) by mouth at bedtime as needed for sleep. 30 tablet 3   No current facility-administered medications for this visit.     REVIEW OF SYSTEMS:  [X]  denotes positive finding, [ ]  denotes negative finding Cardiac  Comments:   Chest pain or chest pressure:    Shortness of breath upon exertion:    Short of breath when lying flat:    Irregular heart rhythm:        Vascular    Pain in calf, thigh, or hip brought on by ambulation:    Pain in feet at night that wakes you up from your sleep:     Blood clot in your veins:    Leg swelling:  x         PHYSICAL EXAM: Vitals:   02/23/17 0907  BP: 120/87  Pulse: 73  Resp: 18  SpO2: 100%  Weight: 130 lb 4.8 oz (59.1 kg)  Height: 5\' 3"  (1.6 m)    GENERAL: The patient is a well-nourished female, in no acute distress. The vital signs are documented above. CARDIOVASCULAR: Palpable left dorsalis pedis pulse. Varicosities on her medial thigh and calf PULMONARY: There is good air exchange  MUSCULOSKELETAL: There are no major deformities or cyanosis. NEUROLOGIC: No focal weakness or paresthesias are detected. SKIN: There are no ulcers or rashes noted. PSYCHIATRIC: The patient has a normal affect.  DATA:  Reviewed her duplex from March 2018 revealing reflux in her anterior accessory branch. Had prior closure of her left great saphenous vein. This reflux extends directly into her varicosities  MEDICAL ISSUES: Conservative treatment. Have recommended laser ablation of her left anterior saphenous vein. Also 10-20 stab phlebectomy is of her painful varicosities. I explained this as an outpatient procedure under local anesthesia. She wishes to proceed as soon as possible    Rosetta Posner, MD [facility] Vascular and Vein Specialists of [facility] Tel [phone] Pager [phone]

## 2017-03-04 ENCOUNTER — Telehealth: Payer: Self-pay | Admitting: Family Medicine

## 2017-03-04 NOTE — Telephone Encounter (Signed)
Called and clarified - addended note that has referral in it on 6/4 to clarify reason for referral.

## 2017-03-04 NOTE — Telephone Encounter (Signed)
April from [facility] called requesting additional information concerning pt referral. They are wanting clarification on what exactly the pt is coming in for. If the Dr. Is going to call, the provider line can be used at [phone]. If not, the triage nurse can be reached at [phone]. Pt is scheduled to come in on 03/09/17. Please advise. Thanks!

## 2017-03-22 ENCOUNTER — Ambulatory Visit: Payer: 59

## 2017-04-01 ENCOUNTER — Other Ambulatory Visit: Payer: Self-pay | Admitting: *Deleted

## 2017-04-01 DIAGNOSIS — I83812 Varicose veins of left lower extremities with pain: Secondary | ICD-10-CM

## 2017-04-05 ENCOUNTER — Ambulatory Visit
Admission: RE | Admit: 2017-04-05 | Discharge: 2017-04-05 | Disposition: A | Discharge status: Home / Self Care | Payer: 59 | Source: Ambulatory Visit | Attending: Obstetrics and Gynecology | Admitting: Obstetrics and Gynecology

## 2017-04-05 DIAGNOSIS — Z1231 Encounter for screening mammogram for malignant neoplasm of breast: Secondary | ICD-10-CM

## 2017-04-07 ENCOUNTER — Other Ambulatory Visit: Payer: Self-pay | Admitting: Obstetrics and Gynecology

## 2017-04-07 DIAGNOSIS — R928 Other abnormal and inconclusive findings on diagnostic imaging of breast: Secondary | ICD-10-CM

## 2017-04-09 ENCOUNTER — Ambulatory Visit
Admission: RE | Admit: 2017-04-09 | Discharge: 2017-04-09 | Disposition: A | Discharge status: Home / Self Care | Payer: 59 | Source: Ambulatory Visit | Attending: Obstetrics and Gynecology | Admitting: Obstetrics and Gynecology

## 2017-04-09 ENCOUNTER — Inpatient Hospital Stay: Admission: RE | Admit: 2017-04-09 | Payer: 59 | Source: Ambulatory Visit

## 2017-04-09 ENCOUNTER — Other Ambulatory Visit: Payer: 59

## 2017-04-09 DIAGNOSIS — N6489 Other specified disorders of breast: Secondary | ICD-10-CM | POA: Diagnosis not present

## 2017-04-09 DIAGNOSIS — R922 Inconclusive mammogram: Secondary | ICD-10-CM | POA: Diagnosis not present

## 2017-04-09 DIAGNOSIS — R928 Other abnormal and inconclusive findings on diagnostic imaging of breast: Secondary | ICD-10-CM

## 2017-04-13 ENCOUNTER — Other Ambulatory Visit: Payer: 59

## 2017-04-22 ENCOUNTER — Ambulatory Visit: Payer: 59 | Admitting: Obstetrics and Gynecology

## 2017-04-27 ENCOUNTER — Encounter: Payer: Self-pay | Admitting: Family Medicine

## 2017-04-27 ENCOUNTER — Ambulatory Visit (INDEPENDENT_AMBULATORY_CARE_PROVIDER_SITE_OTHER): Payer: 59 | Admitting: Family Medicine

## 2017-04-27 VITALS — BP 118/72 | HR 93 | Temp 98.0°F | Resp 16 | Ht 63.0 in | Wt 131.0 lb

## 2017-04-27 DIAGNOSIS — I10 Essential (primary) hypertension: Secondary | ICD-10-CM | POA: Diagnosis not present

## 2017-04-27 MED ORDER — HYDROCHLOROTHIAZIDE 25 MG PO TABS: 25.0000 mg | ORAL_TABLET | Freq: Every morning | ORAL | 0 refills | Status: DC

## 2017-04-27 NOTE — Progress Notes (Signed)
10/2/20186:28 PM  Grace [name] April 06, 1966, 51 y.o. female [id number]  Chief Complaint  Patient presents with  . Hypertension    follow-up  . Medication Refill    refills on medications     HPI:   Patient is a 51 y.o. female with past medical history significant for HTN who presents today requesting a 7 day rx for HTN medication as she is traveling out of town and her mail order will not arrive before she leaves. She otherwise has no acute concerns today.  Depression screen [facility] 2/9 04/27/2017 12/28/2016  Decreased Interest 0 0  Down, Depressed, Hopeless 0 0  PHQ - 2 Score 0 0    No Known Allergies  Prior to Admission medications   Medication Sig Start Date End Date Taking? Authorizing Provider  hydrochlorothiazide (HYDRODIURIL) 25 MG tablet Take 1 tablet (25 mg total) by mouth every morning. 04/27/17  Yes Rutherford Guys, MD  traZODone (DESYREL) 50 MG tablet Take 0.5-1 tablets (25-50 mg total) by mouth at bedtime as needed for sleep. 12/28/16  Yes Shawnee Knapp, MD    Past Medical History:  Diagnosis Date  . Allergy   . Arthritis   . Hypertension     Past Surgical History:  Procedure Laterality Date  . DILATION AND CURETTAGE OF UTERUS  2005, 2006  . INGUINAL HERNIA REPAIR  1975   right    Social History  Substance Use Topics  . Smoking status: Former Smoker    Quit date: 01/21/1997  . Smokeless tobacco: Never Used  . Alcohol use 1.2 oz/week    2 Glasses of wine per week    Family History  Problem Relation Age of Onset  . Colon polyps Mother   . Hypertension Mother   . Colon polyps Father   . Cancer Father        Prostate  . Hypertension Father   . Colon polyps Sister 2  . Colon polyps Brother 31  . Diabetes Paternal Grandmother   . Stroke Paternal Grandfather   . Hypertension Unknown        family hx  . Cancer Unknown        prostate  . Stomach cancer Neg Hx     Review of Systems  Respiratory: Negative for shortness of breath.   Cardiovascular:  Negative for chest pain, palpitations and leg swelling.  Neurological: Negative for dizziness.     OBJECTIVE:  Blood pressure 118/72, pulse 93, temperature 98 F (36.7 C), temperature source Oral, resp. rate 16, height 5\' 3"  (1.6 m), weight 131 lb (59.4 kg), last menstrual period 10/26/2015, SpO2 98 %.  Physical Exam  Constitutional: She is oriented to person, place, and time and well-developed, well-nourished, and in no distress.  HENT:  Head: Normocephalic and atraumatic.  Mouth/Throat: Oropharynx is clear and moist. No oropharyngeal exudate.  Eyes: Pupils are equal, round, and reactive to light. EOM are normal. No scleral icterus.  Neck: Neck supple.  Cardiovascular: Normal rate, regular rhythm and normal heart sounds.  Exam reveals no gallop and no friction rub.   No murmur heard. Pulmonary/Chest: Effort normal and breath sounds normal. She has no wheezes. She has no rales.  Neurological: She is alert and oriented to person, place, and time. Gait normal.  Skin: Skin is warm and dry.      ASSESSMENT and PLAN  1. Essential hypertension BP at goal today. Discussed ssx of hypotension. 7 day refill given. FU with PCP as scheduled.  hydrochlorothiazide (HYDRODIURIL)  25 MG tablet; Take 1 tablet (25 mg total) by mouth every morning.  Return in about 6 months (around 10/26/2017).    Rutherford Guys, MD Primary [facility] at [place], [place] [address] Ph.  [phone] Fax [phone]

## 2017-04-27 NOTE — Patient Instructions (Signed)
     IF you received an x-ray today, you will receive an invoice from [facility]. Please contact [facility] at [phone] with questions or concerns regarding your invoice.   IF you received labwork today, you will receive an invoice from [facility]. Please contact [facility] at [phone] with questions or concerns regarding your invoice.   Our billing staff will not be able to assist you with questions regarding bills from these companies.  You will be contacted with the lab results as soon as they are available. The fastest way to get your results is to activate your My Chart account. Instructions are located on the last page of this paperwork. If you have not heard from us regarding the results in 2 weeks, please contact this office.     

## 2017-05-06 ENCOUNTER — Other Ambulatory Visit: Payer: 59 | Admitting: Vascular Surgery

## 2017-05-11 DIAGNOSIS — G8929 Other chronic pain: Secondary | ICD-10-CM | POA: Diagnosis not present

## 2017-05-11 DIAGNOSIS — R1031 Right lower quadrant pain: Secondary | ICD-10-CM | POA: Diagnosis not present

## 2017-05-13 ENCOUNTER — Encounter (HOSPITAL_COMMUNITY): Payer: 59

## 2017-05-13 ENCOUNTER — Ambulatory Visit: Payer: 59 | Admitting: Vascular Surgery

## 2017-05-17 ENCOUNTER — Ambulatory Visit (INDEPENDENT_AMBULATORY_CARE_PROVIDER_SITE_OTHER): Payer: 59 | Admitting: Physician Assistant

## 2017-05-17 ENCOUNTER — Encounter: Payer: Self-pay | Admitting: Physician Assistant

## 2017-05-17 VITALS — BP 122/68 | HR 74 | Temp 98.7°F | Resp 18 | Ht 62.99 in | Wt 132.8 lb

## 2017-05-17 DIAGNOSIS — I10 Essential (primary) hypertension: Secondary | ICD-10-CM | POA: Diagnosis not present

## 2017-05-17 DIAGNOSIS — J3489 Other specified disorders of nose and nasal sinuses: Secondary | ICD-10-CM | POA: Diagnosis not present

## 2017-05-17 DIAGNOSIS — J0100 Acute maxillary sinusitis, unspecified: Secondary | ICD-10-CM | POA: Diagnosis not present

## 2017-05-17 DIAGNOSIS — H938X1 Other specified disorders of right ear: Secondary | ICD-10-CM

## 2017-05-17 DIAGNOSIS — R0981 Nasal congestion: Secondary | ICD-10-CM | POA: Diagnosis not present

## 2017-05-17 MED ORDER — FLUTICASONE PROPIONATE 50 MCG/ACT NA SUSP: 2.0000 | Freq: Every day | NASAL | 0 refills | Status: DC

## 2017-05-17 MED ORDER — HYDROCHLOROTHIAZIDE 25 MG PO TABS: 25.0000 mg | ORAL_TABLET | Freq: Every morning | ORAL | 1 refills | Status: DC

## 2017-05-17 MED ORDER — PSEUDOEPHEDRINE HCL 60 MG PO TABS: 60.0000 mg | ORAL_TABLET | Freq: Two times a day (BID) | ORAL | 0 refills | Status: DC

## 2017-05-17 MED ORDER — CETIRIZINE HCL 10 MG PO TABS: 10.0000 mg | ORAL_TABLET | Freq: Every day | ORAL | 0 refills | Status: DC

## 2017-05-17 NOTE — Patient Instructions (Addendum)
For facial pain and nasal congestion, we are going to treat this symptomatically with oral antihistamine, decongestant, and nasal steroid. Please take as prescribed. If no improvement in 3-5 days, please return to office. If you develop any shock like pain, numbness, tingling, or weakness of your face, please return sooner.   For blood pressure, I have given you refills for your medication. I recommend checking your bp outside of the office. Your goal is 120/60. If you are consistently at this value without taking bp medication, you likely do not need to be taking blood pressure medication at this dose. Therefore, continue to monitor. If bp rises to 130/80, take 12.5mg  daily. If it is rises beyond 130/80, take 25mg  daily. Return in 6 months for reevaluation. Thank you for letting me participate in your health and well being.   Sinusitis, Adult Sinusitis is soreness and inflammation of your sinuses. Sinuses are hollow spaces in the bones around your face. They are located:  Around your eyes.  In the middle of your forehead.  Behind your nose.  In your cheekbones.  Your sinuses and nasal passages are lined with a stringy fluid (mucus). Mucus normally drains out of your sinuses. When your nasal tissues get inflamed or swollen, the mucus can get trapped or blocked so air cannot flow through your sinuses. This lets bacteria, viruses, and funguses grow, and that leads to infection. Follow these instructions at home: Medicines  Take, use, or apply over-the-counter and prescription medicines only as told by your doctor. These may include nasal sprays.  If you were prescribed an antibiotic medicine, take it as told by your doctor. Do not stop taking the antibiotic even if you start to feel better. Hydrate and Humidify  Drink enough water to keep your pee (urine) clear or pale yellow.  Use a cool mist humidifier to keep the humidity level in your home above 50%.  Breathe in steam for 10-15  minutes, 3-4 times a day or as told by your doctor. You can do this in the bathroom while a hot shower is running.  Try not to spend time in cool or dry air. Rest  Rest as much as possible.  Sleep with your head raised (elevated).  Make sure to get enough sleep each night. General instructions  Put a warm, moist washcloth on your face 3-4 times a day or as told by your doctor. This will help with discomfort.  Wash your hands often with soap and water. If there is no soap and water, use hand sanitizer.  Do not smoke. Avoid being around people who are smoking (secondhand smoke).  Keep all follow-up visits as told by your doctor. This is important. Contact a doctor if:  You have a fever.  Your symptoms get worse.  Your symptoms do not get better within 10 days. Get help right away if:  You have a very bad headache.  You cannot stop throwing up (vomiting).  You have pain or swelling around your face or eyes.  You have trouble seeing.  You feel confused.  Your neck is stiff.  You have trouble breathing. This information is not intended to replace advice given to you by your health care provider. Make sure you discuss any questions you have with your health care provider. Document Released: 12/30/2007 Document Revised: 03/08/2016 Document Reviewed: 05/08/2015 Elsevier Interactive Patient Education  [employer].

## 2017-05-17 NOTE — Progress Notes (Signed)
MRN: [id number] DOB: 1965/11/16  Subjective:   Grace [name] is a 51 y.o. female presenting for chief complaint of Facial Pain (X 1 day -right side of face) and Medication Refill (hydrochorothizide) .  Reports 1 day history of right sided sinus congestion, sinus pain, rhinorrhea and ear fullness.  Has not tried anything for relief. Denies fever, itchy watery eyes, sore throat, myalgia and cough, night sweats, chills, fatigue, decreased appetite, nausea, vomiting, abdominal pain, diarrhea and facial numbness, weakness, or shock like sensation. Has not had sick contact with anyone. Has history of seasonal allergies, not currently taking anything.  Denies smoking,   She also needs medication refill for HCTZ 25mg  to be sent to [facility] for insurance purposes. Last seen by Dr. Brigitte Pulse in 12/2016 for annual physical exam. She then saw Dr.Santiago for a 7 day Rx refill as she needed the Rx to go to [facility]. However, had trouble [facility] so returns today to see if we can send it directly to [facility] She has been controlled on HCTZ 25mg  for ~5 years. She does not check bp outside of office. Has been out of medication for 10 days.   [name] has a current medication list which includes the following prescription(s): hydrochlorothiazide and trazodone. Also has No Known Allergies.  [name]  has a past medical history of Allergy; Arthritis; and Hypertension. Also  has a past surgical history that includes Inguinal hernia repair (1975) and Dilation and curettage of uterus (2005, 2006).   Objective:   Vitals: BP 122/68 (BP Location: Left Arm, Patient Position: Sitting, Cuff Size: Normal)   Pulse 74   Temp 98.7 F (37.1 C) (Oral)   Resp 18   Ht 5' 2.99" (1.6 m)   Wt 132 lb 12.8 oz (60.2 kg)   LMP 10/26/2015 (Approximate)   BMI 23.53 kg/m   Physical Exam  Constitutional: She is oriented to person, place, and time. She appears well-developed and well-nourished. No distress.  HENT:  Head:  Normocephalic and atraumatic.  Right Ear: External ear and ear canal normal. Tympanic membrane is retracted. Tympanic membrane is not erythematous.  Left Ear: Tympanic membrane, external ear and ear canal normal. Tympanic membrane is not erythematous and not retracted.  Nose: Mucosal edema (moderate on right, mild on left) and rhinorrhea present. Right sinus exhibits maxillary sinus tenderness. Right sinus exhibits no frontal sinus tenderness. Left sinus exhibits no maxillary sinus tenderness and no frontal sinus tenderness.  Mouth/Throat: Uvula is midline, oropharynx is clear and moist and mucous membranes are normal.  Eyes: Conjunctivae are normal.  Neck: Normal range of motion.  Cardiovascular: Normal rate, regular rhythm, normal heart sounds and intact distal pulses.   Pulmonary/Chest: Effort normal and breath sounds normal. She has no wheezes. She has no rhonchi. She has no rales.  Musculoskeletal:       Right lower leg: She exhibits no swelling.       Left lower leg: She exhibits no swelling.  Lymphadenopathy:       Head (right side): No submental, no submandibular, no tonsillar, no preauricular, no posterior auricular and no occipital adenopathy present.       Head (left side): No submental, no submandibular, no tonsillar, no preauricular, no posterior auricular and no occipital adenopathy present.    She has no cervical adenopathy.       Right: No supraclavicular adenopathy present.       Left: No supraclavicular adenopathy present.  Neurological: She is alert and oriented to person,  place, and time. No cranial nerve deficit or sensory deficit.  Skin: Skin is warm and dry.  Psychiatric: She has a normal mood and affect.  Vitals reviewed.   BP Readings from Last 3 Encounters:  05/17/17 122/68  04/27/17 118/72  02/23/17 120/87    No results found for this or any previous visit (from the past 24 hour(s)).  Assessment and Plan :  1. Sinus pressure 2. Sensation of fullness in  right ear 3. Nasal congestion 4. Acute maxillary sinusitis, recurrence not specified Due to duration of sx, stable vital signs, and that pt is well appearing on PE, will treat symptomatically at this time as do not suspect bacterial etiology at this time. Pt encouraged to return to clinic if symptoms worsen, do not improve in 3-5 days, or as needed. - fluticasone (FLONASE) 50 MCG/ACT nasal spray; Place 2 sprays into both nostrils daily.  Dispense: 16 g; Refill: 0 - cetirizine (ZYRTEC) 10 MG tablet; Take 1 tablet (10 mg total) by mouth daily.  Dispense: 30 tablet; Refill: 0 - pseudoephedrine (SUDAFED) 60 MG tablet; Take 1 tablet (60 mg total) by mouth 2 (two) times daily.  Dispense: 20 tablet; Refill: 0  5. Essential hypertension Pt's bp is well controlled and she has been off medication for 10 days. I have encoaurged pt to check bp outside of office and monitor her values. If consistently at 120/60 without bp medication, she may not warrant bp medication at this time. If bp rises to 130/80, recommended taking HCTZ 12.5mg . If bp rises >130/80, take HCTZ 25mg  daily. Return in 6 months for reevaluation.  - hydrochlorothiazide (HYDRODIURIL) 25 MG tablet; Take 1 tablet (25 mg total) by mouth every morning.  Dispense: 90 tablet; Refill: Colon, PA-C  [facility] 05/18/2017 11:30 AM

## 2017-06-03 ENCOUNTER — Ambulatory Visit: Payer: 59 | Admitting: Vascular Surgery

## 2017-06-03 ENCOUNTER — Encounter: Payer: Self-pay | Admitting: Vascular Surgery

## 2017-06-03 VITALS — BP 130/73 | HR 79 | Temp 97.8°F | Resp 16 | Ht 63.0 in | Wt 130.0 lb

## 2017-06-03 DIAGNOSIS — I83892 Varicose veins of left lower extremities with other complications: Secondary | ICD-10-CM | POA: Diagnosis not present

## 2017-06-03 NOTE — Progress Notes (Signed)
     Laser Ablation Procedure    Date: 06/03/2017   Grace [name] DOB:[birth year]  Consent signed: Yes    Surgeon:  Dr. Sherren Mocha Shyan Scalisi  Procedure: Laser Ablation: left Greater Saphenous Vein  BP 130/73   Pulse 79   Temp 97.8 F (36.6 C)   Resp 16   Ht 5\' 3"  (1.6 m)   Wt 130 lb (59 kg)   LMP 10/26/2015 (Approximate)   SpO2 99%   BMI 23.03 kg/m   Tumescent Anesthesia: 400 cc 0.9% NaCl with 50 cc Lidocaine HCL with 1% Epi and 15 cc 8.4% NaHCO3  Local Anesthesia: 4 cc Lidocaine HCL and NaHCO3 (ratio 2:1)  15 watts continuous mode        Total energy: 1112    Total time: 1:11    Stab Phlebectomy: 10-20 Sites: Thigh and Calf  Patient tolerated procedure well  Notes:   Description of Procedure:  After marking the course of the secondary varicosities, the patient was placed on the operating table in the supine position, and the left leg was prepped and draped in sterile fashion.   Local anesthetic was administered and under ultrasound guidance the saphenous vein was accessed with a micro needle and guide wire; then the mirco puncture sheath was placed.  A guide wire was inserted saphenofemoral junction , followed by a 5 french sheath.  The position of the sheath and then the laser fiber below the junction was confirmed using the ultrasound.  Tumescent anesthesia was administered along the course of the saphenous vein using ultrasound guidance. The patient was placed in Trendelenburg position and protective laser glasses were placed on patient and staff, and the laser was fired at 15 watts continuous mode advancing 1-18mm/second for a total of 1112 joules.   For stab phlebectomies, local anesthetic was administered at the previously marked varicosities, and tumescent anesthesia was administered around the vessels.  Ten to 20 stab wounds were made using the tip of an 11 blade. And using the vein hook, the phlebectomies were performed using a hemostat to avulse the varicosities.  Adequate  hemostasis was achieved.     Steri strips were applied to the stab wounds and ABD pads and thigh high compression stockings were applied.  Ace wrap bandages were applied over the phlebectomy sites and at the top of the saphenofemoral junction. Blood loss was less than 15 cc.  The patient ambulated out of the operating room having tolerated the procedure well.  Uneventful ablation of her anterior accessory great saphenous vein in her anterior thigh and stab phlebectomy of tributary varicosities in her thigh and medial calf.  Follow-up in 1 week

## 2017-06-04 ENCOUNTER — Encounter: Payer: Self-pay | Admitting: Vascular Surgery

## 2017-06-07 ENCOUNTER — Ambulatory Visit: Payer: 59 | Admitting: Obstetrics and Gynecology

## 2017-06-08 ENCOUNTER — Ambulatory Visit: Payer: 59 | Admitting: Vascular Surgery

## 2017-06-08 ENCOUNTER — Encounter: Payer: Self-pay | Admitting: Vascular Surgery

## 2017-06-08 ENCOUNTER — Encounter (HOSPITAL_COMMUNITY): Payer: 59

## 2017-06-08 ENCOUNTER — Ambulatory Visit (INDEPENDENT_AMBULATORY_CARE_PROVIDER_SITE_OTHER): Payer: 59 | Admitting: Vascular Surgery

## 2017-06-08 ENCOUNTER — Other Ambulatory Visit: Payer: Self-pay

## 2017-06-08 ENCOUNTER — Ambulatory Visit (HOSPITAL_COMMUNITY)
Admission: RE | Admit: 2017-06-08 | Discharge: 2017-06-08 | Disposition: A | Discharge status: Home / Self Care | Payer: 59 | Source: Ambulatory Visit | Attending: Vascular Surgery | Admitting: Vascular Surgery

## 2017-06-08 VITALS — BP 132/92 | HR 67 | Temp 97.7°F | Resp 16 | Ht 63.0 in | Wt 125.0 lb

## 2017-06-08 DIAGNOSIS — I83812 Varicose veins of left lower extremities with pain: Secondary | ICD-10-CM | POA: Diagnosis not present

## 2017-06-08 DIAGNOSIS — I82492 Acute embolism and thrombosis of other specified deep vein of left lower extremity: Secondary | ICD-10-CM | POA: Insufficient documentation

## 2017-06-08 DIAGNOSIS — Z48812 Encounter for surgical aftercare following surgery on the circulatory system: Secondary | ICD-10-CM | POA: Diagnosis not present

## 2017-06-08 DIAGNOSIS — I83892 Varicose veins of left lower extremities with other complications: Secondary | ICD-10-CM

## 2017-06-08 NOTE — Progress Notes (Signed)
Subjective:     Patient ID: Grace [name], female   DOB: [birth year], 51 y.o.   MRN: [id number]  HPI This 51 year old female is one week post-laser ablation anterior accessory branch left great saphenous vein plus multiple stab phlebectomy of painful varicosities. Patient had previously had laser ablation of the left great saphenous vein main trunk. She has worn her elastic compression stockings and taken  ibuprofen as instructed.  Past Medical History:  Diagnosis Date  . Allergy   . Arthritis   . Hypertension     Social History   Tobacco Use  . Smoking status: Former Smoker    Last attempt to quit: 01/21/1997    Years since quitting: 20.3  . Smokeless tobacco: Never Used  Substance Use Topics  . Alcohol use: Yes    Alcohol/week: 1.2 oz    Types: 2 Glasses of wine per week    Family History  Problem Relation Age of Onset  . Colon polyps Mother   . Hypertension Mother   . Colon polyps Father   . Cancer Father        Prostate  . Hypertension Father   . Colon polyps Sister 59  . Colon polyps Brother 8  . Diabetes Paternal Grandmother   . Stroke Paternal Grandfather   . Hypertension Unknown        family hx  . Cancer Unknown        prostate  . Stomach cancer Neg Hx     No Known Allergies   Current Outpatient Medications:  .  cetirizine (ZYRTEC) 10 MG tablet, Take 1 tablet (10 mg total) by mouth daily., Disp: 30 tablet, Rfl: 0 .  fluticasone (FLONASE) 50 MCG/ACT nasal spray, Place 2 sprays into both nostrils daily., Disp: 16 g, Rfl: 0 .  hydrochlorothiazide (HYDRODIURIL) 25 MG tablet, Take 1 tablet (25 mg total) by mouth every morning., Disp: 90 tablet, Rfl: 1 .  pseudoephedrine (SUDAFED) 60 MG tablet, Take 1 tablet (60 mg total) by mouth 2 (two) times daily., Disp: 20 tablet, Rfl: 0 .  traZODone (DESYREL) 50 MG tablet, Take 0.5-1 tablets (25-50 mg total) by mouth at bedtime as needed for sleep., Disp: 30 tablet, Rfl: 3  Vitals:   06/08/17 1127  BP: (!) 132/92   Pulse: 67  Resp: 16  Temp: 97.7 F (36.5 C)  TempSrc: Oral  SpO2: 98%  Weight: 125 lb (56.7 kg)  Height: 5\' 3"  (1.6 m)    Body mass index is 22.14 kg/m.        Review of Systems Denies chest pain, dyspnea on exertion, PND, orthopnea, hemoptysis    Objective:   Physical Exam BP (!) 132/92 (BP Location: Left Arm, Patient Position: Sitting, Cuff Size: Normal)   Pulse 67   Temp 97.7 F (36.5 C) (Oral)   Resp 16   Ht 5\' 3"  (1.6 m)   Wt 125 lb (56.7 kg)   LMP 10/26/2015 (Approximate)   SpO2 98%   BMI 22.14 kg/m   Gen. well-developed well-nourished female no apparent distress alert and oriented 3 Left leg with mild ecchymosis in mid to distal thigh over laser ablation site. Stab phlebectomy sites all healing nicely with Steri-Strips in place. No distal edema. 2+ dorsalis pedis pulse palpable.  Today I ordered a venous duplex exam left leg which I reviewed and interpreted. The anterior accessory vein is totally occluded from the mid thigh to near the saphenofemoral junction and there is no DVT     Assessment:  Successful laser ablation anterior accessory branch left great saphenous vein with multiple stab phlebectomy of painful varicosities    Plan:     Patient return to see Dr. early on a when necessary basis Will wear elastic compression stockings for 1 more week

## 2017-06-22 ENCOUNTER — Ambulatory Visit: Payer: 59 | Admitting: Obstetrics and Gynecology

## 2017-06-22 ENCOUNTER — Other Ambulatory Visit: Payer: Self-pay

## 2017-06-22 ENCOUNTER — Encounter: Payer: Self-pay | Admitting: Obstetrics and Gynecology

## 2017-06-22 VITALS — BP 122/60 | HR 80 | Resp 14 | Ht 62.5 in | Wt 129.0 lb

## 2017-06-22 DIAGNOSIS — Z01419 Encounter for gynecological examination (general) (routine) without abnormal findings: Secondary | ICD-10-CM | POA: Diagnosis not present

## 2017-06-22 NOTE — Progress Notes (Signed)
51 y.o. [id number] MarriedCaucasianF here for annual exam.  No bleeding. No dyspareunia.    Patient's last menstrual period was 10/26/2015 (approximate).          Sexually active: Yes.    The current method of family planning is post menopausal status.    Exercising: No.  The patient does not participate in regular exercise at present. Smoker:  Former smoker  Health Maintenance: Pap:  04-18-15 WNL NEG HR HPV 01-02-14 WNL  History of abnormal Pap:  Yes- years ago MMG:  04-09-17 Cyst right breast WNL  Colonoscopy:  03-04-12 WNL repeat in 10 yrs  BMD: unsure    TDaP:  12-04-11 Gardasil: N/A   reports that she quit smoking about 20 years ago. she has never used smokeless tobacco. She reports that she drinks about 1.2 - 1.8 oz of alcohol per week. She reports that she does not use drugs. Adopted daughter is 7. She works for a [employer], industrial/product.  Past Medical History:  Diagnosis Date  . Allergy   . Arthritis   . Hypertension     Past Surgical History:  Procedure Laterality Date  . DILATION AND CURETTAGE OF UTERUS  2005, 2006  . INGUINAL HERNIA REPAIR  1975   right    Current Outpatient Medications  Medication Sig Dispense Refill  . cetirizine (ZYRTEC) 10 MG tablet Take 1 tablet (10 mg total) by mouth daily. 30 tablet 0  . fluticasone (FLONASE) 50 MCG/ACT nasal spray Place 2 sprays into both nostrils daily. 16 g 0  . hydrochlorothiazide (HYDRODIURIL) 25 MG tablet Take 1 tablet (25 mg total) by mouth every morning. 90 tablet 1  . pseudoephedrine (SUDAFED) 60 MG tablet Take 1 tablet (60 mg total) by mouth 2 (two) times daily. 20 tablet 0  . traZODone (DESYREL) 50 MG tablet Take 0.5-1 tablets (25-50 mg total) by mouth at bedtime as needed for sleep. (Patient not taking: Reported on 06/22/2017) 30 tablet 3   No current facility-administered medications for this visit.     Family History  Problem Relation Age of Onset  . Colon polyps Mother   . Hypertension Mother   . Colon polyps Father   .  Cancer Father        Prostate  . Hypertension Father   . Colon polyps Sister 88  . Colon polyps Brother 42  . Diabetes Paternal Grandmother   . Stroke Paternal Grandfather   . Hypertension Unknown        family hx  . Cancer Unknown        prostate  . Stomach cancer Neg Hx     Review of Systems  Constitutional: Negative.   HENT: Negative.   Eyes: Negative.   Respiratory: Negative.   Cardiovascular: Negative.   Gastrointestinal: Negative.   Endocrine: Negative.   Genitourinary: Negative.   Musculoskeletal: Negative.   Skin: Negative.   Allergic/Immunologic: Negative.   Neurological: Negative.   Psychiatric/Behavioral: Negative.     Exam:   BP 122/60 (BP Location: Right Arm, Patient Position: Sitting, Cuff Size: Normal)   Pulse 80   Resp 14   Ht 5' 2.5" (1.588 m)   Wt 129 lb (58.5 kg)   LMP 10/26/2015 (Approximate)   BMI 23.22 kg/m   Weight change: @WEIGHTCHANGE @ Height:   Height: 5' 2.5" (158.8 cm)  Ht Readings from Last 3 Encounters:  06/22/17 5' 2.5" (1.588 m)  06/08/17 5\' 3"  (1.6 m)  06/03/17 5\' 3"  (1.6 m)    General appearance: alert, cooperative and  appears stated age Head: Normocephalic, without obvious abnormality, atraumatic Neck: no adenopathy, supple, symmetrical, trachea midline and thyroid normal to inspection and palpation Lungs: clear to auscultation bilaterally Cardiovascular: regular rate and rhythm Breasts: normal appearance, no masses or tenderness Abdomen: soft, non-tender; non distended,  no masses,  no organomegaly Extremities: extremities normal, atraumatic, no cyanosis or edema Skin: Skin color, texture, turgor normal. No rashes or lesions Lymph nodes: Cervical, supraclavicular, and axillary nodes normal. No abnormal inguinal nodes palpated Neurologic: Grossly normal   Pelvic: External genitalia:  no lesions              Urethra:  normal appearing urethra with no masses, tenderness or lesions              Bartholins and Skenes: normal                  Vagina: normal appearing vagina with normal color and discharge, no lesions              Cervix: no lesions               Bimanual Exam:  Uterus:  normal size, contour, position, consistency, mobility, non-tender              Adnexa: no mass, fullness, tenderness               Rectovaginal: Confirms               Anus:  normal sphincter tone, no lesions  Chaperone was present for exam.  A:  Well Woman with normal exam   P:   She will check if she is due for a colonoscopy now or in 5 years  Discussed breast self exam  Discussed calcium and vit D intake  Mammogram UTD  Labs UTD with her primary

## 2017-06-22 NOTE — Patient Instructions (Signed)
EXERCISE AND DIET:  We recommended that you start or continue a regular exercise program for good health. Regular exercise means any activity that makes your heart beat faster and makes you sweat.  We recommend exercising at least 30 minutes per day at least 3 days a week, preferably 4 or 5.  We also recommend a diet low in fat and sugar.  Inactivity, poor dietary choices and obesity can cause diabetes, heart attack, stroke, and kidney damage, among others.    ALCOHOL AND SMOKING:  Women should limit their alcohol intake to no more than 7 drinks/beers/glasses of wine (combined, not each!) per week. Moderation of alcohol intake to this level decreases your risk of breast cancer and liver damage. And of course, no recreational drugs are part of a healthy lifestyle.  And absolutely no smoking or even second hand smoke. Most people know smoking can cause heart and lung diseases, but did you know it also contributes to weakening of your bones? Aging of your skin?  Yellowing of your teeth and nails?  CALCIUM AND VITAMIN D:  Adequate intake of calcium and Vitamin D are recommended.  The recommendations for exact amounts of these supplements seem to change often, but generally speaking 600 mg of calcium (either carbonate or citrate) and 800 units of Vitamin D per day seems prudent. Certain women may benefit from higher intake of Vitamin D.  If you are among these women, your doctor will have told you during your visit.    PAP SMEARS:  Pap smears, to check for cervical cancer or precancers,  have traditionally been done yearly, although recent scientific advances have shown that most women can have pap smears less often.  However, every woman still should have a physical exam from her gynecologist every year. It will include a breast check, inspection of the vulva and vagina to check for abnormal growths or skin changes, a visual exam of the cervix, and then an exam to evaluate the size and shape of the uterus and  ovaries.  And after 51 years of age, a rectal exam is indicated to check for rectal cancers. We will also provide age appropriate advice regarding health maintenance, like when you should have certain vaccines, screening for sexually transmitted diseases, bone density testing, colonoscopy, mammograms, etc.   MAMMOGRAMS:  All women over 40 years old should have a yearly mammogram. Many facilities now offer a "3D" mammogram, which may cost around $50 extra out of pocket. If possible,  we recommend you accept the option to have the 3D mammogram performed.  It both reduces the number of women who will be called back for extra views which then turn out to be normal, and it is better than the routine mammogram at detecting truly abnormal areas.    COLONOSCOPY:  Colonoscopy to screen for colon cancer is recommended for all women at age 50.  We know, you hate the idea of the prep.  We agree, BUT, having colon cancer and not knowing it is worse!!  Colon cancer so often starts as a polyp that can be seen and removed at colonscopy, which can quite literally save your life!  And if your first colonoscopy is normal and you have no family history of colon cancer, most women don't have to have it again for 10 years.  Once every ten years, you can do something that may end up saving your life, right?  We will be happy to help you get it scheduled when you are ready.    Be sure to check your insurance coverage so you understand how much it will cost.  It may be covered as a preventative service at no cost, but you should check your particular policy.      Breast Self-Awareness Breast self-awareness means being familiar with how your breasts look and feel. It involves checking your breasts regularly and reporting any changes to your health care provider. Practicing breast self-awareness is important. A change in your breasts can be a sign of a serious medical problem. Being familiar with how your breasts look and feel allows  you to find any problems early, when treatment is more likely to be successful. All women should practice breast self-awareness, including women who have had breast implants. How to do a breast self-exam One way to learn what is normal for your breasts and whether your breasts are changing is to do a breast self-exam. To do a breast self-exam: Look for Changes  1. Remove all the clothing above your waist. 2. Stand in front of a mirror in a room with good lighting. 3. Put your hands on your hips. 4. Push your hands firmly downward. 5. Compare your breasts in the mirror. Look for differences between them (asymmetry), such as: ? Differences in shape. ? Differences in size. ? Puckers, dips, and bumps in one breast and not the other. 6. Look at each breast for changes in your skin, such as: ? Redness. ? Scaly areas. 7. Look for changes in your nipples, such as: ? Discharge. ? Bleeding. ? Dimpling. ? Redness. ? A change in position. Feel for Changes  Carefully feel your breasts for lumps and changes. It is best to do this while lying on your back on the floor and again while sitting or standing in the shower or tub with soapy water on your skin. Feel each breast in the following way:  Place the arm on the side of the breast you are examining above your head.  Feel your breast with the other hand.  Start in the nipple area and make  inch (2 cm) overlapping circles to feel your breast. Use the pads of your three middle fingers to do this. Apply light pressure, then medium pressure, then firm pressure. The light pressure will allow you to feel the tissue closest to the skin. The medium pressure will allow you to feel the tissue that is a little deeper. The firm pressure will allow you to feel the tissue close to the ribs.  Continue the overlapping circles, moving downward over the breast until you feel your ribs below your breast.  Move one finger-width toward the center of the body.  Continue to use the  inch (2 cm) overlapping circles to feel your breast as you move slowly up toward your collarbone.  Continue the up and down exam using all three pressures until you reach your armpit.  Write Down What You Find  Write down what is normal for each breast and any changes that you find. Keep a written record with breast changes or normal findings for each breast. By writing this information down, you do not need to depend only on memory for size, tenderness, or location. Write down where you are in your menstrual cycle, if you are still menstruating. If you are having trouble noticing differences in your breasts, do not get discouraged. With time you will become more familiar with the variations in your breasts and more comfortable with the exam. How often should I examine my breasts? Examine   your breasts every month. If you are breastfeeding, the best time to examine your breasts is after a feeding or after using a breast pump. If you menstruate, the best time to examine your breasts is 5-7 days after your period is over. During your period, your breasts are lumpier, and it may be more difficult to notice changes. When should I see my health care provider? See your health care provider if you notice:  A change in shape or size of your breasts or nipples.  A change in the skin of your breast or nipples, such as a reddened or scaly area.  Unusual discharge from your nipples.  A lump or thick area that was not there before.  Pain in your breasts.  Anything that concerns you.  This information is not intended to replace advice given to you by your health care provider. Make sure you discuss any questions you have with your health care provider. Document Released: 07/13/2005 Document Revised: 12/19/2015 Document Reviewed: 06/02/2015 Elsevier Interactive Patient Education  2018 Elsevier Inc.  

## 2017-07-09 ENCOUNTER — Other Ambulatory Visit: Payer: Self-pay

## 2017-07-09 ENCOUNTER — Encounter: Payer: Self-pay | Admitting: Family Medicine

## 2017-07-09 ENCOUNTER — Ambulatory Visit: Payer: 59 | Admitting: Family Medicine

## 2017-07-09 VITALS — BP 122/88 | HR 75 | Temp 98.4°F | Resp 16 | Ht 62.0 in | Wt 127.0 lb

## 2017-07-09 DIAGNOSIS — H6982 Other specified disorders of Eustachian tube, left ear: Secondary | ICD-10-CM

## 2017-07-09 DIAGNOSIS — J0100 Acute maxillary sinusitis, unspecified: Secondary | ICD-10-CM

## 2017-07-09 DIAGNOSIS — H73012 Bullous myringitis, left ear: Secondary | ICD-10-CM

## 2017-07-09 MED ORDER — AMOXICILLIN-POT CLAVULANATE 875-125 MG PO TABS: 1.0000 | ORAL_TABLET | Freq: Two times a day (BID) | ORAL | 0 refills | Status: DC

## 2017-07-09 NOTE — Progress Notes (Signed)
Patient ID: Grace [name], female    DOB: 05-09-1966  Age: 51 y.o. MRN: [id number]  Chief Complaint  Patient presents with  . Sinusitis    sinus pressure over eyes/ x 2 wks  . Headache    started today    Subjective:   51 year old lady with a history of respiratory tract congestion for at least the last 2 weeks.  She is gradually gotten more problems with pressure in her sinuses around her eyes.  Today she had a headache down the left side primarily but  She has had some discomfort in her left ear, and has an appointment to see an ear doctor on Tuesday.  She does not smoke.  She is generally pretty healthy.  She is menopausal.  Current allergies, medications, problem list, past/family and social histories reviewed.  Objective:  BP 122/88   Pulse 75   Temp 98.4 F (36.9 C) (Oral)   Resp 16   Ht 5\' 2"  (1.575 m)   Wt 127 lb (57.6 kg)   LMP 10/26/2015 (Approximate)   SpO2 97%   BMI 23.23 kg/m   No major distress.  TMs normal on the right.  Left has a bullous area in the posterior quadrant.  However it is not significantly red looking.  The drum may be a little bit retracted.  Throat mildly erythematous without exudate.  The pressure in the sinus areas as noted above.  Neck supple without significant nodes.  Chest is clear to auscultation.  Heart regular without murmur.  Assessment & Plan:   Assessment: 1. Acute non-recurrent maxillary sinusitis   2. Bullous myringitis of left ear   3. Dysfunction of left eustachian tube       Plan: See instructions  No orders of the defined types were placed in this encounter.   No orders of the defined types were placed in this encounter.        Patient Instructions   Drink plenty of fluids to stay well-hydrated, and get enough rest to make sure that your body beats off the infection.  Take the Augmentin (amoxicillin/clavulanate) 875 mg 1 twice daily for infection  Use the Flonase (fluticasone) nose spray 2 sprays each nostril  twice daily for about 5 days, then drop back to once daily.  Hopefully this will help open up your eustachian tubes.  Take Allegra-D or Claritin-D or Zyrtec-D to help decongest you while the antibiotics are doing the job.  If the ear feels fine by Monday you may not need to see your ear doctor, but if still having symptoms keep your appointment.  Return if needed    IF you received an x-ray today, you will receive an invoice from [facility]. Please contact [facility] at [phone] with questions or concerns regarding your invoice.   IF you received labwork today, you will receive an invoice from [facility]. Please contact [facility] at [phone] with questions or concerns regarding your invoice.   Our billing staff will not be able to assist you with questions regarding bills from these companies.  You will be contacted with the lab results as soon as they are available. The fastest way to get your results is to activate your My Chart account. Instructions are located on the last page of this paperwork. If you have not heard from [place] regarding the results in 2 weeks, please contact this office.         Return if symptoms worsen or fail to improve.   HOPPER,DAVID, MD 07/09/2017

## 2017-07-09 NOTE — Patient Instructions (Addendum)
Drink plenty of fluids to stay well-hydrated, and get enough rest to make sure that your body beats off the infection.  Take the Augmentin (amoxicillin/clavulanate) 875 mg 1 twice daily for infection  Use the Flonase (fluticasone) nose spray 2 sprays each nostril twice daily for about 5 days, then drop back to once daily.  Hopefully this will help open up your eustachian tubes.  Take Allegra-D or Claritin-D or Zyrtec-D to help decongest you while the antibiotics are doing the job.  If the ear feels fine by Monday you may not need to see your ear doctor, but if still having symptoms keep your appointment.  Return if needed    IF you received an x-ray today, you will receive an invoice from [facility]. Please contact [facility] at [phone] with questions or concerns regarding your invoice.   IF you received labwork today, you will receive an invoice from [place]. Please contact [facility] at [phone] with questions or concerns regarding your invoice.   Our billing staff will not be able to assist you with questions regarding bills from these companies.  You will be contacted with the lab results as soon as they are available. The fastest way to get your results is to activate your My Chart account. Instructions are located on the last page of this paperwork. If you have not heard from [place] regarding the results in 2 weeks, please contact this office.

## 2017-07-13 DIAGNOSIS — J310 Chronic rhinitis: Secondary | ICD-10-CM | POA: Diagnosis not present

## 2017-07-13 DIAGNOSIS — J343 Hypertrophy of nasal turbinates: Secondary | ICD-10-CM | POA: Diagnosis not present

## 2017-07-13 DIAGNOSIS — H6522 Chronic serous otitis media, left ear: Secondary | ICD-10-CM | POA: Diagnosis not present

## 2017-07-13 DIAGNOSIS — H9012 Conductive hearing loss, unilateral, left ear, with unrestricted hearing on the contralateral side: Secondary | ICD-10-CM | POA: Diagnosis not present

## 2017-07-13 DIAGNOSIS — H6982 Other specified disorders of Eustachian tube, left ear: Secondary | ICD-10-CM | POA: Diagnosis not present

## 2017-08-11 DIAGNOSIS — H6522 Chronic serous otitis media, left ear: Secondary | ICD-10-CM | POA: Diagnosis not present

## 2017-08-11 DIAGNOSIS — H9012 Conductive hearing loss, unilateral, left ear, with unrestricted hearing on the contralateral side: Secondary | ICD-10-CM | POA: Diagnosis not present

## 2017-08-11 DIAGNOSIS — H6982 Other specified disorders of Eustachian tube, left ear: Secondary | ICD-10-CM | POA: Diagnosis not present

## 2017-09-03 ENCOUNTER — Ambulatory Visit: Payer: 59 | Admitting: Urgent Care

## 2017-09-03 ENCOUNTER — Encounter: Payer: Self-pay | Admitting: Urgent Care

## 2017-09-03 VITALS — BP 120/82 | HR 74 | Temp 98.0°F | Resp 16 | Ht 62.0 in | Wt 127.8 lb

## 2017-09-03 DIAGNOSIS — J329 Chronic sinusitis, unspecified: Secondary | ICD-10-CM | POA: Diagnosis not present

## 2017-09-03 DIAGNOSIS — J310 Chronic rhinitis: Secondary | ICD-10-CM

## 2017-09-03 MED ORDER — METHYLPREDNISOLONE ACETATE 80 MG/ML IJ SUSP
80.0000 mg | Freq: Once | INTRAMUSCULAR | Status: AC
  Administered 2017-09-03: 80 mg via INTRAMUSCULAR

## 2017-09-03 MED ORDER — PSEUDOEPHEDRINE HCL 60 MG PO TABS: 60.0000 mg | ORAL_TABLET | Freq: Three times a day (TID) | ORAL | 1 refills | Status: DC | PRN

## 2017-09-03 NOTE — Progress Notes (Signed)
    MRN: [id number] DOB: 1965-09-27  Subjective:   Grace [name] is a 52 y.o. female presenting for follow up on maxillary sinusitis, bullous myringitis of left ear as diagnosed at her last OV on 07/09/2017. She was started Augmentin, caused nausea so patient stopped taking this. Today, she reports ongoing sinus congestion, pressure around her eyes, drainage of her throat, cough, throat congestion. Denies fever, chest pain, shob, n/v, abdominal pain, rashes, ear pain, ear drainage. Denies smoking cigarettes.  Grace [name] has a current medication list which includes the following prescription(s): cetirizine, fluticasone, hydrochlorothiazide, and trazodone. Also is allergic to augmentin [amoxicillin-pot clavulanate].  Grace [name]  has a past medical history of Allergy, Arthritis, and Hypertension. Also  has a past surgical history that includes Inguinal hernia repair (1975) and Dilation and curettage of uterus (2005, 2006).  Objective:   Vitals: BP 120/82   Pulse 74   Temp 98 F (36.7 C) (Oral)   Resp 16   Ht 5\' 2"  (1.575 m)   Wt 127 lb 12.8 oz (58 kg)   LMP 10/26/2015 (Approximate)   SpO2 99%   BMI 23.37 kg/m   Physical Exam  Constitutional: She is oriented to person, place, and time. She appears well-developed and well-nourished.  HENT:  TM's intact bilaterally, no effusions or erythema. Nasal turbinates boggy, edematous, nasal passages minimally patent. Mild bilateral maxillary sinus tenderness. Oropharynx with mild post-nasal drainage, mucous membranes moist.    Eyes: Right eye exhibits no discharge. Left eye exhibits no discharge.  Neck: Normal range of motion. Neck supple.  Cardiovascular: Normal rate, regular rhythm and intact distal pulses. Exam reveals no gallop and no friction rub.  No murmur heard. Pulmonary/Chest: No respiratory distress. She has no wheezes. She has no rales.  Lymphadenopathy:    She has no cervical adenopathy.  Neurological: She is alert and oriented to person, place,  and time.  Skin: Skin is warm and dry.  Psychiatric: She has a normal mood and affect.   Assessment and Plan :   Chronic rhinitis - Plan: methylPREDNISolone acetate (DEPO-MEDROL) injection 80 mg  Chronic sinusitis, unspecified location - Plan: methylPREDNISolone acetate (DEPO-MEDROL) injection 80 mg  Will restart Augmentin, take this with food. Hold Flonase, use Sudafed as needed. Hydrate better. IM Depomedrol today. Return-to-clinic precautions discussed, patient verbalized understanding.   Grace Eagles, PA-C [facility] [phone] 09/03/2017 2:42 PM

## 2017-09-03 NOTE — Patient Instructions (Addendum)
Hydrate well with at least 2 liters (1 gallon) of water daily. Use Zyrtec 10mg  daily as a maintenance medication for chronic rhinitis (sinus congestion). You may use Sudafed (pseudoephedrine) 60mg  once every 8 hours or 120mg  once every 12 hours as needed for nasal congestion if your rhinitis is not controlled with Zyrtec alone. Once your sinusitis is better and you no longer have symptoms, you may restart the Flonase. Take Augmentin with food for sinus infection.   Methylprednisolone Suspension for Injection What is this medicine? METHYLPREDNISOLONE (meth ill pred NISS oh lone) is a corticosteroid. It is commonly used to treat inflammation of the skin, joints, lungs, and other organs. Common conditions treated include asthma, allergies, and arthritis. It is also used for other conditions, such as blood disorders and diseases of the adrenal glands. This medicine may be used for other purposes; ask your health care provider or pharmacist if you have questions. COMMON BRAND NAME(S): Depmedalone-40, Depmedalone-80, Depo-Medrol What should I tell my health care provider before I take this medicine? They need to know if you have any of these conditions: -Cushing's syndrome -eye disease, vision problems -diabetes -glaucoma -heart disease -high blood pressure -infection (especially a virus infection such as chickenpox, cold sores, or herpes) -liver disease -mental illness -myasthenia gravis -osteoporosis -recently received or scheduled to receive a vaccine -seizures -stomach or intestine problems -thyroid disease -an unusual or allergic reaction to lactose, methylprednisolone, other medicines, foods, dyes, or preservatives -pregnant or trying to get pregnant -breast-feeding How should I use this medicine? This medicine is for injection into a muscle, joint, or other tissue. It is given by a health care professional in a hospital or clinic setting. Talk to your pediatrician regarding the use of  this medicine in children. While this drug may be prescribed for selected conditions, precautions do apply. Overdosage: If you think you have taken too much of this medicine contact a poison control center or emergency room at once. NOTE: This medicine is only for you. Do not share this medicine with others. What if I miss a dose? This does not apply. What may interact with this medicine? Do not take this medicine with any of the following medications: -alefacept -echinacea -iopamidol -live virus vaccines -metyrapone -mifepristone This medicine may also interact with the following medications: -amphotericin B -aspirin and aspirin-like medicines -certain antibiotics like erythromycin, clarithromycin, troleandomycin -certain medicines for diabetes -certain medicines for fungal infections like ketoconazole -certain medicines for seizures like carbamazepine, phenobarbital, phenytoin -certain medicines that treat or prevent blood clots like warfarin -cyclosporine -digoxin -diuretics -female hormones, like estrogens and birth control pills -isoniazid -NSAIDs, medicines for pain inflammation, like ibuprofen or naproxen -other medicines for myasthenia gravis -rifampin -vaccines This list may not describe all possible interactions. Give your health care provider a list of all the medicines, herbs, non-prescription drugs, or dietary supplements you use. Also tell them if you smoke, drink alcohol, or use illegal drugs. Some items may interact with your medicine. What should I watch for while using this medicine? Tell your doctor or healthcare professional if your symptoms do not start to get better or if they get worse. Do not stop taking except on your doctor's advice. You may develop a severe reaction. Your doctor will tell you how much medicine to take. Your condition will be monitored carefully while you are receiving this medicine. This medicine may increase your risk of getting an  infection. Tell your doctor or health care professional if you are around anyone with measles or chickenpox,  or if you develop sores or blisters that do not heal properly. This medicine may affect blood sugar levels. If you have diabetes, check with your doctor or health care professional before you change your diet or the dose of your diabetic medicine. Tell your doctor or health care professional right away if you have any change in your eyesight. Using this medicine for a long time may increase your risk of low bone mass. Talk to your doctor about bone health. What side effects may I notice from receiving this medicine? Side effects that you should report to your doctor or health care professional as soon as possible: -allergic reactions like skin rash, itching or hives, swelling of the face, lips, or tongue -bloody or tarry stools -changes in vision -hallucination, loss of contact with reality -muscle cramps -muscle pain -palpitations -signs and symptoms of high blood sugar such as dizziness; dry mouth; dry skin; fruity breath; nausea; stomach pain; increased hunger or thirst; increased urination -signs and symptoms of infection like fever or chills; cough; sore throat; pain or trouble passing urine -trouble passing urine or change in the amount of urine Side effects that usually do not require medical attention (report to your doctor or health care professional if they continue or are bothersome): -changes in emotions or mood -constipation -diarrhea -excessive hair growth on the face or body -headache -nausea, vomiting -pain, redness, or irritation at site where injected -trouble sleeping -weight gain This list may not describe all possible side effects. Call your doctor for medical advice about side effects. You may report side effects to FDA at 1-800-FDA-1088. Where should I keep my medicine? This drug is given in a hospital or clinic and will not be stored at home. NOTE: This  sheet is a summary. It may not cover all possible information. If you have questions about this medicine, talk to your doctor, pharmacist, or health care provider.  2018 Elsevier/Gold Standard (2015-09-19 16:17:02)     IF you received an x-ray today, you will receive an invoice from [facility]. Please contact [facility] at [phone] with questions or concerns regarding your invoice.   IF you received labwork today, you will receive an invoice from [place]. Please contact [facility] at [phone] with questions or concerns regarding your invoice.   Our billing staff will not be able to assist you with questions regarding bills from these companies.  You will be contacted with the lab results as soon as they are available. The fastest way to get your results is to activate your My Chart account. Instructions are located on the last page of this paperwork. If you have not heard from [place] regarding the results in 2 weeks, please contact this office.

## 2018-02-02 ENCOUNTER — Other Ambulatory Visit: Payer: Self-pay | Admitting: Family Medicine

## 2018-02-02 DIAGNOSIS — I10 Essential (primary) hypertension: Secondary | ICD-10-CM

## 2018-02-06 ENCOUNTER — Other Ambulatory Visit: Payer: Self-pay

## 2018-02-06 ENCOUNTER — Encounter (HOSPITAL_COMMUNITY): Payer: Self-pay | Admitting: Emergency Medicine

## 2018-02-06 ENCOUNTER — Emergency Department (HOSPITAL_COMMUNITY): Payer: 59

## 2018-02-06 ENCOUNTER — Emergency Department (HOSPITAL_COMMUNITY)
Admission: EM | Admit: 2018-02-06 | Discharge: 2018-02-06 | Disposition: A | Discharge status: Home / Self Care | Payer: 59 | Attending: Emergency Medicine | Admitting: Emergency Medicine

## 2018-02-06 DIAGNOSIS — N201 Calculus of ureter: Secondary | ICD-10-CM | POA: Diagnosis not present

## 2018-02-06 DIAGNOSIS — I10 Essential (primary) hypertension: Secondary | ICD-10-CM | POA: Diagnosis not present

## 2018-02-06 DIAGNOSIS — Z87891 Personal history of nicotine dependence: Secondary | ICD-10-CM | POA: Insufficient documentation

## 2018-02-06 DIAGNOSIS — R1031 Right lower quadrant pain: Secondary | ICD-10-CM | POA: Diagnosis present

## 2018-02-06 DIAGNOSIS — Z79899 Other long term (current) drug therapy: Secondary | ICD-10-CM | POA: Insufficient documentation

## 2018-02-06 DIAGNOSIS — R1110 Vomiting, unspecified: Secondary | ICD-10-CM | POA: Insufficient documentation

## 2018-02-06 DIAGNOSIS — N132 Hydronephrosis with renal and ureteral calculous obstruction: Secondary | ICD-10-CM | POA: Diagnosis not present

## 2018-02-06 LAB — URINALYSIS, ROUTINE W REFLEX MICROSCOPIC
Bilirubin Urine: NEGATIVE
Glucose, UA: NEGATIVE mg/dL
Hgb urine dipstick: NEGATIVE
Ketones, ur: 80 mg/dL — AB
LEUKOCYTES UA: NEGATIVE
NITRITE: NEGATIVE
PH: 5 (ref 5.0–8.0)
Protein, ur: NEGATIVE mg/dL
SPECIFIC GRAVITY, URINE: 1.014 (ref 1.005–1.030)

## 2018-02-06 LAB — CBC
HCT: 41.3 % (ref 36.0–46.0)
HEMOGLOBIN: 13.5 g/dL (ref 12.0–15.0)
MCH: 29.5 pg (ref 26.0–34.0)
MCHC: 32.7 g/dL (ref 30.0–36.0)
MCV: 90.2 fL (ref 78.0–100.0)
Platelets: 291 10*3/uL (ref 150–400)
RBC: 4.58 MIL/uL (ref 3.87–5.11)
RDW: 13 % (ref 11.5–15.5)
WBC: 12 10*3/uL — AB (ref 4.0–10.5)

## 2018-02-06 LAB — COMPREHENSIVE METABOLIC PANEL
ALBUMIN: 4.3 g/dL (ref 3.5–5.0)
ALT: 15 U/L (ref 0–44)
ANION GAP: 12 (ref 5–15)
AST: 19 U/L (ref 15–41)
Alkaline Phosphatase: 47 U/L (ref 38–126)
BUN: 16 mg/dL (ref 6–20)
CO2: 22 mmol/L (ref 22–32)
Calcium: 9.5 mg/dL (ref 8.9–10.3)
Chloride: 104 mmol/L (ref 98–111)
Creatinine, Ser: 0.77 mg/dL (ref 0.44–1.00)
GFR calc non Af Amer: >60 mL/min (ref >60)
Glucose, Bld: 144 mg/dL — ABNORMAL HIGH (ref 70–99)
Potassium: 3.3 mmol/L — ABNORMAL LOW (ref 3.5–5.1)
Sodium: 138 mmol/L (ref 135–145)
Total Bilirubin: 0.9 mg/dL (ref 0.3–1.2)
Total Protein: 7.2 g/dL (ref 6.5–8.1)

## 2018-02-06 LAB — LIPASE, BLOOD: LIPASE: 31 U/L (ref 11–51)

## 2018-02-06 LAB — I-STAT BETA HCG BLOOD, ED (MC, WL, AP ONLY): I-stat hCG, quantitative: <5 m[IU]/mL (ref <5)

## 2018-02-06 MED ORDER — KETOROLAC TROMETHAMINE 15 MG/ML IJ SOLN
15.0000 mg | Freq: Once | INTRAMUSCULAR | Status: AC
  Administered 2018-02-06: 15 mg via INTRAVENOUS
  Filled 2018-02-06: qty 1

## 2018-02-06 MED ORDER — HYDROCODONE-ACETAMINOPHEN 5-325 MG PO TABS: 1.0000 | ORAL_TABLET | ORAL | 0 refills | Status: DC | PRN

## 2018-02-06 MED ORDER — HYDROMORPHONE HCL 1 MG/ML IJ SOLN
1.0000 mg | Freq: Once | INTRAMUSCULAR | Status: AC
  Administered 2018-02-06: 1 mg via INTRAVENOUS
  Filled 2018-02-06: qty 1

## 2018-02-06 MED ORDER — SODIUM CHLORIDE 0.9 % IV BOLUS
1000.0000 mL | Freq: Once | INTRAVENOUS | Status: AC
  Administered 2018-02-06: 1000 mL via INTRAVENOUS

## 2018-02-06 MED ORDER — IBUPROFEN 600 MG PO TABS: 600.0000 mg | ORAL_TABLET | Freq: Three times a day (TID) | ORAL | 0 refills | Status: DC | PRN

## 2018-02-06 MED ORDER — FENTANYL CITRATE (PF) 100 MCG/2ML IJ SOLN
50.0000 ug | INTRAMUSCULAR | Status: DC | PRN
  Administered 2018-02-06: 50 ug via INTRAVENOUS
  Filled 2018-02-06: qty 2

## 2018-02-06 MED ORDER — ONDANSETRON 4 MG PO TBDP: 4.0000 mg | ORAL_TABLET | Freq: Once | ORAL | Status: DC | PRN

## 2018-02-06 MED ORDER — ONDANSETRON 4 MG PO TBDP: 4.0000 mg | ORAL_TABLET | Freq: Three times a day (TID) | ORAL | 0 refills | Status: DC | PRN

## 2018-02-06 MED ORDER — ONDANSETRON HCL 4 MG/2ML IJ SOLN
4.0000 mg | Freq: Once | INTRAMUSCULAR | Status: AC
  Administered 2018-02-06: 4 mg via INTRAVENOUS
  Filled 2018-02-06: qty 2

## 2018-02-06 NOTE — ED Notes (Addendum)
Husband stopping staff in waiting room asking for help. Took pt back to triage room 1

## 2018-02-06 NOTE — ED Notes (Signed)
Pt in restroom. Family concerned

## 2018-02-06 NOTE — ED Provider Notes (Signed)
[facility] EMERGENCY DEPARTMENT Provider Note   CSN: [id number] Arrival date & time: 02/06/18  1524     History   Chief Complaint Chief Complaint  Patient presents with  . Abdominal Pain  . Emesis    HPI Grace [name] is a 52 y.o. female.  HPI  52 year old female presents with sudden, severe right lower quadrant abdominal pain.  Started about 2 hours ago.  On the right appears started to hurt in her back as well.  She felt like she is had to have a bowel movement but despite taking Metamucil and having a bowel movement her pain has not improved.  It has worsened.  She also feels like she needs to urinate but cannot and has a little bit of burning when trying.  Has been having nausea.  No fevers.  Has a history of kidney stones but not since around 10 years ago and does not remember if this feels similar.  Past Medical History:  Diagnosis Date  . Allergy   . Arthritis   . Hypertension     Patient Active Problem List   Diagnosis Date Noted  . Viral URI with cough 08/02/2014  . Abdominal pain, other specified site 04/03/2014  . Right hip pain 10/03/2013  . DUB (dysfunctional uterine bleeding) 10/03/2013  . Varicose veins of left lower extremity with complications 60/73/7106  . Varicose veins left leg w/both ulcer heel and midfoot and inflammation 11/22/2012  . Dysplastic nevus of trunk 12/14/2011  . Asthma with allergic rhinitis 03/03/2011  . GERD 01/31/2009  . BREAST CYSTS, RIGHT 02/10/2008  . MIGRAINE VARIANT 12/06/2007  . RENAL CALCULUS, RIGHT 11/15/2007  . Essential hypertension 04/05/2007  . ALLERGIC RHINITIS 02/10/2007    Past Surgical History:  Procedure Laterality Date  . DILATION AND CURETTAGE OF UTERUS  2005, 2006  . INGUINAL HERNIA REPAIR  1975   right     OB History    Gravida  2   Para      Term      Preterm      AB  2   Living        SAB  2   TAB      Ectopic      Multiple      Live Births                Home Medications    Prior to Admission medications   Medication Sig Start Date End Date Taking? Authorizing Provider  psyllium (METAMUCIL) 58.6 % packet Take 1 packet by mouth daily.   Yes [provider]  cetirizine (ZYRTEC) 10 MG tablet Take 1 tablet (10 mg total) by mouth daily. Patient not taking: Reported on 09/03/2017 05/17/17   Tenna Delaine D, PA-C  fluticasone [facility]) 50 MCG/ACT nasal spray Place 2 sprays into both nostrils daily. Patient not taking: Reported on 09/03/2017 05/17/17   Tenna Delaine D, PA-C  hydrochlorothiazide (HYDRODIURIL) 25 MG tablet TAKE 1 TABLET BY MOUTH EVERY MORNING Patient not taking: Reported on 02/06/2018 02/03/18   Leonie Douglas, PA-C  HYDROcodone-acetaminophen (NORCO) 5-325 MG tablet Take 1-2 tablets by mouth every 4 (four) hours as needed for severe pain. 02/06/18   Sherwood Gambler, MD  ibuprofen (ADVIL,MOTRIN) 600 MG tablet Take 1 tablet (600 mg total) by mouth every 8 (eight) hours as needed. 02/06/18   Sherwood Gambler, MD  ondansetron (ZOFRAN ODT) 4 MG disintegrating tablet Take 1 tablet (4 mg total) by mouth every 8 (eight)  hours as needed for nausea or vomiting. 02/06/18   Sherwood Gambler, MD  pseudoephedrine (SUDAFED) 60 MG tablet Take 1 tablet (60 mg total) by mouth every 8 (eight) hours as needed. Patient not taking: Reported on 02/06/2018 09/03/17   Jaynee Eagles, PA-C  traZODone (DESYREL) 50 MG tablet Take 0.5-1 tablets (25-50 mg total) by mouth at bedtime as needed for sleep. Patient not taking: Reported on 06/22/2017 12/28/16   Shawnee Knapp, MD    Family History Family History  Problem Relation Age of Onset  . Colon polyps Mother   . Hypertension Mother   . Colon polyps Father   . Cancer Father        Prostate  . Hypertension Father   . Colon polyps Sister 86  . Colon polyps Brother 58  . Diabetes Paternal Grandmother   . Stroke Paternal Grandfather   . Hypertension Unknown        family hx  . Cancer Unknown         prostate  . Stomach cancer Neg Hx     Social History Social History   Tobacco Use  . Smoking status: Former Smoker    Last attempt to quit: 01/21/1997    Years since quitting: 21.0  . Smokeless tobacco: Never Used  Substance Use Topics  . Alcohol use: Yes    Alcohol/week: 1.2 - 1.8 oz    Types: 2 - 3 Glasses of wine per week  . Drug use: No     Allergies   Augmentin [amoxicillin-pot clavulanate]   Review of Systems Review of Systems  Constitutional: Negative for fever.  Gastrointestinal: Positive for abdominal pain and nausea. Negative for vomiting.  Genitourinary: Positive for difficulty urinating.  Musculoskeletal: Positive for back pain.  All other systems reviewed and are negative.    Physical Exam Updated Vital Signs BP 127/76   Pulse (!) 56   Temp 98 F (36.7 C) (Oral)   Resp 16   Ht 5\' 3"  (1.6 m)   Wt 56.7 kg (125 lb)   LMP 10/26/2015 (Approximate)   SpO2 100%   BMI 22.14 kg/m   Physical Exam  Constitutional: She is oriented to person, place, and time. She appears well-developed and well-nourished. She appears distressed (in pain).  HENT:  Head: Normocephalic and atraumatic.  Right Ear: External ear normal.  Left Ear: External ear normal.  Nose: Nose normal.  Eyes: Right eye exhibits no discharge. Left eye exhibits no discharge.  Cardiovascular: Normal rate, regular rhythm and normal heart sounds.  Pulmonary/Chest: Effort normal and breath sounds normal.  Abdominal: Soft. There is tenderness in the right lower quadrant. There is CVA tenderness (right).  Neurological: She is alert and oriented to person, place, and time.  Skin: Skin is warm and dry.  Nursing note and vitals reviewed.    ED Treatments / Results  Labs (all labs ordered are listed, but only abnormal results are displayed) Labs Reviewed  COMPREHENSIVE METABOLIC PANEL - Abnormal; Notable for the following components:      Result Value   Potassium 3.3 (*)    Glucose, Bld 144 (*)     All other components within normal limits  CBC - Abnormal; Notable for the following components:   WBC 12.0 (*)    All other components within normal limits  URINALYSIS, ROUTINE W REFLEX MICROSCOPIC - Abnormal; Notable for the following components:   Ketones, ur 80 (*)    All other components within normal limits  LIPASE, BLOOD  I-STAT BETA  HCG BLOOD, ED ([facility], [facility], [facility] ONLY)    EKG None  Radiology Ct Renal Stone Study  Result Date: 02/06/2018 CLINICAL DATA:  RIGHT LOWER QUADRANT abdominal pain and back pain. Constipation. Dysuria. History of kidney stones. EXAM: CT ABDOMEN AND PELVIS WITHOUT CONTRAST TECHNIQUE: Multidetector CT imaging of the abdomen and pelvis was performed following the standard protocol without IV contrast. COMPARISON:  05/30/2015 and 2008 FINDINGS: Lower chest: Focal area of pleural thickening at the RIGHT lung base is stable compared with multiple prior studies Heart size is normal. No pericardial effusion or significant coronary artery calcifications. Hepatobiliary: No focal liver abnormality is seen. No radiopaque gallstones, biliary dilatation, or pericholecystic inflammatory changes. Pancreas: Unremarkable. No pancreatic ductal dilatation or surrounding inflammatory changes. Spleen: Normal in size without focal abnormality. Adrenals/Urinary Tract: Atrophic RIGHT kidney with numerous intrarenal calculi. There is RIGHT-sided hydronephrosis and hydroureter secondary to a calculus at the ureterovesical junction which measures 4 millimeters. The LEFT kidney and ureter are normal in appearance. Urinary bladder is decompressed and normal. Adrenal glands are normal in appearance. Stomach/Bowel: The stomach and small bowel loops are normal in appearance. Large bowel loops are normal in appearance. The appendix is well seen and has a normal appearance. Colon is normal in appearance. Vascular/Lymphatic: No significant vascular findings are present. No enlarged abdominal or pelvic  lymph nodes. Reproductive: The uterus is present.  No adnexal mass. Other: No free pelvic fluid. Anterior abdominal wall is unremarkable. Musculoskeletal: Scoliosis convex to the LEFT. No evidence for acute abnormality. IMPRESSION: 1. RIGHT hydronephrosis secondary to a 4 millimeter calculus at the RIGHT ureterovesical junction. 2. Atrophic RIGHT kidney with nephrolithiasis. 3. Normal appearance of the LEFT kidney. 4. No bowel obstruction. 5. Scoliosis. Electronically Signed   By: Nolon Nations M.D.   On: 02/06/2018 16:58    Procedures Procedures (including critical care time)  Medications Ordered in ED Medications  ondansetron (ZOFRAN-ODT) disintegrating tablet 4 mg (has no administration in time range)  fentaNYL (SUBLIMAZE) injection 50 mcg (50 mcg Intravenous Given 02/06/18 1617)  ondansetron (ZOFRAN) injection 4 mg (4 mg Intravenous Given 02/06/18 1617)  HYDROmorphone (DILAUDID) injection 1 mg (1 mg Intravenous Given 02/06/18 1650)  sodium chloride 0.9 % bolus 1,000 mL (0 mLs Intravenous Stopped 02/06/18 1808)  HYDROmorphone (DILAUDID) injection 1 mg (1 mg Intravenous Given 02/06/18 1758)  ketorolac (TORADOL) 15 MG/ML injection 15 mg (15 mg Intravenous Given 02/06/18 1756)     Initial Impression / Assessment and Plan / ED Course  I have reviewed the triage vital signs and the nursing notes.  Pertinent labs & imaging results that were available during my care of the patient were reviewed by me and considered in my medical decision making (see chart for details).     CT scan confirms right ureteral stone at UVJ. After dilaudid and toradol she is now resting comfortably. UA benign except some ketones. Now that symptoms are controlled, she appears stable for discharge home. F/u with urology. Return precautions.   Final Clinical Impressions(s) / ED Diagnoses   Final diagnoses:  Right ureteral stone    ED Discharge Orders        Ordered    ibuprofen (ADVIL,MOTRIN) 600 MG tablet  Every 8  hours PRN     02/06/18 2037    HYDROcodone-acetaminophen (NORCO) 5-325 MG tablet  Every 4 hours PRN     02/06/18 2037    ondansetron (ZOFRAN ODT) 4 MG disintegrating tablet  Every 8 hours PRN     02/06/18 2038  Sherwood Gambler, MD 02/06/18 [phone]

## 2018-02-06 NOTE — Discharge Instructions (Signed)
If you develop worsening pain, vomiting, fever, urinary tract infection symptoms such as frequency or urgency of urination or burning then return to the ER for evaluation.  Otherwise follow-up with the urologist listed.

## 2018-02-06 NOTE — ED Triage Notes (Signed)
Pt presents with RLQ abd pain with back pain and constipation and dysuria with burning; pt states she took metamucil PTA with success; pt with hx of kidney stones; never had any abdominal surgery; pt obviously uncomfortable in triage with 44mcg fentanyl and 4mg  zofran given

## 2018-02-23 DIAGNOSIS — N202 Calculus of kidney with calculus of ureter: Secondary | ICD-10-CM | POA: Diagnosis not present

## 2018-03-11 ENCOUNTER — Other Ambulatory Visit: Payer: Self-pay | Admitting: Obstetrics and Gynecology

## 2018-03-11 DIAGNOSIS — Z1231 Encounter for screening mammogram for malignant neoplasm of breast: Secondary | ICD-10-CM

## 2018-04-11 ENCOUNTER — Ambulatory Visit: Payer: 59

## 2018-05-16 ENCOUNTER — Ambulatory Visit
Admission: RE | Admit: 2018-05-16 | Discharge: 2018-05-16 | Disposition: A | Discharge status: Home / Self Care | Payer: 59 | Source: Ambulatory Visit | Attending: Obstetrics and Gynecology | Admitting: Obstetrics and Gynecology

## 2018-05-16 DIAGNOSIS — Z1231 Encounter for screening mammogram for malignant neoplasm of breast: Secondary | ICD-10-CM | POA: Diagnosis not present

## 2018-05-18 ENCOUNTER — Other Ambulatory Visit: Payer: Self-pay | Admitting: Family Medicine

## 2018-05-18 DIAGNOSIS — I10 Essential (primary) hypertension: Secondary | ICD-10-CM

## 2018-05-18 MED ORDER — HYDROCHLOROTHIAZIDE 25 MG PO TABS: 25.0000 mg | ORAL_TABLET | Freq: Every morning | ORAL | 0 refills | Status: DC

## 2018-05-18 NOTE — Telephone Encounter (Signed)
Copied from [place] [id number]. Topic: Quick Communication - Rx Refill/Question >> May 18, 2018 10:19 AM Leward Quan A wrote: Medication: hydrochlorothiazide (HYDRODIURIL) 25 MG tablet   Patient is all out of meds  Has the patient contacted their pharmacy?    Preferred Pharmacy (with phone number or street name): [facility], [address] [address] AT [facility] [address] [place] [phone] (Phone) [phone] (Fax)    Agent: Please be advised that RX refills may take up to 3 business days. We ask that you follow-up with your pharmacy.

## 2018-05-18 NOTE — Telephone Encounter (Signed)
Requested Prescriptions  Pending Prescriptions Disp Refills  . hydrochlorothiazide (HYDRODIURIL) 25 MG tablet 60 tablet 0    Sig: Take 1 tablet (25 mg total) by mouth every morning.     Cardiovascular: Diuretics - Thiazide Failed - 05/18/2018 10:27 AM      Failed - K in normal range and within 360 days    Potassium  Date Value Ref Range Status  02/06/2018 3.3 (L) 3.5 - 5.1 mmol/L Final         Failed - Valid encounter within last 6 months    Recent Outpatient Visits          8 months ago Chronic rhinitis   Primary Care at [place], [place]   10 months ago Acute non-recurrent maxillary sinusitis   Primary Care at [facility], Fenton Malling, MD   1 year ago Sinus pressure   Primary Care at [facility], [place] D, PA-C   1 year ago Essential hypertension   [facility], Lilia Argue, MD   1 year ago Annual physical exam   [facility] at [facility], Laurey Arrow, MD      Future Appointments            In 1 month Shawnee Knapp, MD Primary Care at [place], [place] in normal range and within 360 days    Calcium  Date Value Ref Range Status  02/06/2018 9.5 8.9 - 10.3 mg/dL Final         Passed - Cr in normal range and within 360 days    Creat  Date Value Ref Range Status  04/20/2016 0.84 0.50 - 1.10 mg/dL Final   Creatinine, Ser  Date Value Ref Range Status  02/06/2018 0.77 0.44 - 1.00 mg/dL Final         Passed - Na in normal range and within 360 days    Sodium  Date Value Ref Range Status  02/06/2018 138 135 - 145 mmol/L Final  12/28/2016 142 134 - 144 mmol/L Final         Passed - Last BP in normal range    BP Readings from Last 1 Encounters:  02/06/18 127/76

## 2018-06-15 DIAGNOSIS — H6983 Other specified disorders of Eustachian tube, bilateral: Secondary | ICD-10-CM | POA: Diagnosis not present

## 2018-06-27 ENCOUNTER — Other Ambulatory Visit (HOSPITAL_COMMUNITY)
Admission: RE | Admit: 2018-06-27 | Discharge: 2018-06-27 | Disposition: A | Discharge status: Home / Self Care | Payer: 59 | Source: Ambulatory Visit | Attending: Obstetrics and Gynecology | Admitting: Obstetrics and Gynecology

## 2018-06-27 ENCOUNTER — Encounter: Payer: Self-pay | Admitting: Obstetrics and Gynecology

## 2018-06-27 ENCOUNTER — Ambulatory Visit (INDEPENDENT_AMBULATORY_CARE_PROVIDER_SITE_OTHER): Payer: 59 | Admitting: Obstetrics and Gynecology

## 2018-06-27 ENCOUNTER — Other Ambulatory Visit: Payer: Self-pay

## 2018-06-27 VITALS — BP 142/90 | HR 72 | Ht 62.75 in | Wt 125.0 lb

## 2018-06-27 DIAGNOSIS — Z124 Encounter for screening for malignant neoplasm of cervix: Secondary | ICD-10-CM

## 2018-06-27 DIAGNOSIS — Z01419 Encounter for gynecological examination (general) (routine) without abnormal findings: Secondary | ICD-10-CM | POA: Diagnosis not present

## 2018-06-27 DIAGNOSIS — N952 Postmenopausal atrophic vaginitis: Secondary | ICD-10-CM | POA: Diagnosis not present

## 2018-06-27 NOTE — Progress Notes (Signed)
52 y.o. [id number] Married White or Caucasian Not Hispanic or Latino female here for annual exam.  No bleeding. No vasomotor symptoms. Mild vaginal dryness, some discomfort with intercourse. Hasn't tried a lubricant.  No bowel or bladder c/o.     Patient's last menstrual period was 10/26/2015 (approximate).          Sexually active: Yes.    The current method of family planning is post menopausal status.    Exercising: No.  The patient does not participate in regular exercise at present. Smoker:  no  Health Maintenance: Pap:  04-18-15 WNL NEG HR HPV, 01-02-14 WNL  History of abnormal Pap:  Yes- years ago MMG:  05/16/2018 Birads 1 negative Colonoscopy:  03-04-12 WNL, both parents with pre-cancerous polyps BMD: N/A    TDaP:  12-04-11 Gardasil: N/A   reports that she quit smoking about 21 years ago. She has never used smokeless tobacco. She reports that she drinks about 2.0 standard drinks of alcohol per week. She reports that she does not use drugs.  Adopted daughter is 8. She works for a [employer], industrial/product.  Past Medical History:  Diagnosis Date  . Allergy   . Arthritis   . Hypertension     Past Surgical History:  Procedure Laterality Date  . DILATION AND CURETTAGE OF UTERUS  2005, 2006  . INGUINAL HERNIA REPAIR  1975   right    Current Outpatient Medications  Medication Sig Dispense Refill  . hydrochlorothiazide (HYDRODIURIL) 25 MG tablet Take 1 tablet (25 mg total) by mouth every morning. (Patient not taking: Reported on 06/27/2018) 60 tablet 0   No current facility-administered medications for this visit.     Family History  Problem Relation Age of Onset  . Colon polyps Mother   . Hypertension Mother   . Colon polyps Father   . Cancer Father        Prostate  . Hypertension Father   . Colon polyps Sister 20  . Colon polyps Brother 108  . Diabetes Paternal Grandmother   . Stroke Paternal Grandfather   . Hypertension Unknown        family hx  . Cancer Unknown        prostate   . Stomach cancer Neg Hx     Review of Systems  Constitutional: Negative.   HENT: Negative.   Eyes: Negative.   Respiratory: Negative.   Cardiovascular: Negative.   Gastrointestinal: Negative.   Endocrine: Negative.   Genitourinary: Negative.   Musculoskeletal:       Muscle/joint pain  Skin: Negative.   Allergic/Immunologic: Negative.   Neurological: Negative.   Hematological: Negative.   Psychiatric/Behavioral: Negative.     Exam:   BP (!) 142/90 (BP Location: Right Arm, Patient Position: Sitting, Cuff Size: Normal)   Pulse 72   Ht 5' 2.75" (1.594 m)   Wt 125 lb (56.7 kg)   LMP 10/26/2015 (Approximate)   BMI 22.32 kg/m   Weight change: @WEIGHTCHANGE @ Height:   Height: 5' 2.75" (159.4 cm)  Ht Readings from Last 3 Encounters:  06/27/18 5' 2.75" (1.594 m)  02/06/18 5\' 3"  (1.6 m)  09/03/17 5\' 2"  (1.575 m)    General appearance: alert, cooperative and appears stated age Head: Normocephalic, without obvious abnormality, atraumatic Neck: no adenopathy, supple, symmetrical, trachea midline and thyroid normal to inspection and palpation Lungs: clear to auscultation bilaterally Cardiovascular: regular rate and rhythm Breasts: normal appearance, no masses or tenderness Abdomen: soft, non-tender; non distended,  no masses,  no organomegaly Extremities:  extremities normal, atraumatic, no cyanosis or edema Skin: Skin color, texture, turgor normal. No rashes or lesions Lymph nodes: Cervical, supraclavicular, and axillary nodes normal. No abnormal inguinal nodes palpated Neurologic: Grossly normal   Pelvic: External genitalia:  no lesions              Urethra:  normal appearing urethra with no masses, tenderness or lesions              Bartholins and Skenes: normal                 Vagina: atrophic appearing vagina with normal color and discharge, no lesions              Cervix: no lesions               Bimanual Exam:  Uterus:  normal size, contour, position, consistency,  mobility, non-tender              Adnexa: no mass, fullness, tenderness               Rectovaginal: Confirms               Anus:  normal sphincter tone, no lesions  Chaperone was present for exam.  A:  Well Woman with normal exam  P:   Mammogram UTD  Possibly due for colonoscopy, we will check with GI  Discussed breast self exam  Discussed calcium and vit D intake  Try vaginal lubricant, if needed will call in vaginal estrogen (she will call if she wants to try it)  Pap with hpv

## 2018-06-27 NOTE — Patient Instructions (Signed)

## 2018-06-28 ENCOUNTER — Telehealth: Payer: Self-pay

## 2018-06-28 LAB — CYTOLOGY - PAP
Diagnosis: NEGATIVE
HPV: NOT DETECTED

## 2018-06-28 NOTE — Telephone Encounter (Signed)
Attempted to reach patient at number provided, there is no answer. Recording states that the voicemail box is full. Need to advise patient that I spoke with [facility] and she is due for next colonoscopy on 2023 per Dr.Kaplan. If she has any concerns or wants to be screened earlier she can call to schedule an appointment with Dr.Kaplan.

## 2018-06-28 NOTE — Telephone Encounter (Signed)
-----   Message from Salvadore Dom, MD sent at 06/27/2018  3:46 PM EST ----- Please call [facility] and see when she is due for a colonoscopy. She had one in 2013. Both parents had pre-cancerous colon polyps.  Thanks, Sharee Pimple

## 2018-06-29 ENCOUNTER — Encounter: Payer: 59 | Admitting: Family Medicine

## 2018-06-30 NOTE — Telephone Encounter (Signed)
Spoke with patient. Advised of information received from [place]. Patient verbalizes understanding.   Routing to provider and will close encounter.

## 2018-08-01 ENCOUNTER — Encounter: Payer: 59 | Admitting: Family Medicine

## 2018-09-21 ENCOUNTER — Encounter: Payer: 59 | Admitting: Family Medicine

## 2018-11-14 ENCOUNTER — Other Ambulatory Visit: Payer: Self-pay | Admitting: Family Medicine

## 2018-11-14 DIAGNOSIS — I10 Essential (primary) hypertension: Secondary | ICD-10-CM

## 2018-11-14 NOTE — Telephone Encounter (Signed)
Requested medication (s) are due for refill today: yes  Requested medication (s) are on the active medication list: yes  Last refill:  05/18/18  Future visit scheduled:no  Notes to clinic:  Last order states needs appointment for next refill.    Requested Prescriptions  Pending Prescriptions Disp Refills   hydrochlorothiazide (HYDRODIURIL) 25 MG tablet [Pharmacy Med Name: HYDROCHLOROTHIAZIDE 25MG  TABLETS] 90 tablet     Sig: TAKE 1 TABLET BY MOUTH EVERY MORNING.NEED OFFICE VISIT FOR MORE REFILLS     Cardiovascular: Diuretics - Thiazide Failed - 11/14/2018  1:47 PM      Failed - K in normal range and within 360 days    Potassium  Date Value Ref Range Status  02/06/2018 3.3 (L) 3.5 - 5.1 mmol/L Final         Failed - Last BP in normal range    BP Readings from Last 1 Encounters:  06/27/18 (!) 142/90         Failed - Valid encounter within last 6 months    Recent Outpatient Visits          1 year ago Chronic rhinitis   Primary Care at [place], [place]   1 year ago Acute non-recurrent maxillary sinusitis   Primary Care at [facility], Fenton Malling, MD   1 year ago Sinus pressure   [facility] at [facility], [place] D, PA-C   1 year ago Essential hypertension   [facility] at [facility], Lilia Argue, MD   1 year ago Annual physical exam   [facility] at [facility], Laurey Arrow, MD             [place] in normal range and within 360 days    Calcium  Date Value Ref Range Status  02/06/2018 9.5 8.9 - 10.3 mg/dL Final         Passed - Cr in normal range and within 360 days    Creat  Date Value Ref Range Status  04/20/2016 0.84 0.50 - 1.10 mg/dL Final   Creatinine, Ser  Date Value Ref Range Status  02/06/2018 0.77 0.44 - 1.00 mg/dL Final         Passed - Na in normal range and within 360 days    Sodium  Date Value Ref Range Status  02/06/2018 138 135 - 145 mmol/L Final  12/28/2016 142 134 - 144 mmol/L Final

## 2018-11-23 ENCOUNTER — Telehealth: Payer: Self-pay | Admitting: Family Medicine

## 2018-11-23 NOTE — Telephone Encounter (Signed)
Copied from [place] [id number]. Topic: Quick Communication - Rx Refill/Question >> Nov 23, 2018  1:04 PM Nils Flack, Marland Kitchen wrote: Medication: hydrochlorothiazide (HYDRODIURIL) 25 MG tab  Has the patient contacted their pharmacy? Yes.   (Agent: If no, request that the patient contact the pharmacy for the refill.) (Agent: If yes, when and what did the pharmacy advise?)  Preferred Pharm[facility] (with phone number [facility]   Agent: Please be advised that RX refills may take up to 3 business days. We ask that you follow-up with your pharmacy.

## 2018-11-24 NOTE — Telephone Encounter (Signed)
Pt needs tele med apt to get refills and TOC.

## 2018-12-01 ENCOUNTER — Other Ambulatory Visit: Payer: Self-pay

## 2018-12-01 ENCOUNTER — Telehealth (INDEPENDENT_AMBULATORY_CARE_PROVIDER_SITE_OTHER): Payer: 59 | Admitting: Family Medicine

## 2018-12-01 DIAGNOSIS — I10 Essential (primary) hypertension: Secondary | ICD-10-CM | POA: Diagnosis not present

## 2018-12-01 MED ORDER — HYDROCHLOROTHIAZIDE 25 MG PO TABS: 25.0000 mg | ORAL_TABLET | Freq: Every day | ORAL | 0 refills | Status: DC

## 2018-12-01 NOTE — Progress Notes (Signed)
HCTZ refills

## 2018-12-02 NOTE — Progress Notes (Signed)
Telemedicine Encounter- SOAP NOTE Established Patient  I discussed the limitations, risks, security and privacy concerns of performing an evaluation and management service by telephone and the availability of in person appointments. I also discussed with the patient that there may be a patient responsible charge related to this service. The patient expressed understanding and agreed to proceed.  This telephone encounter was conducted with the patient's (or proxy's) verbal consent via audio telecommunications: yes Patient was instructed to have this encounter in a suitably private space; and to only have persons present to whom they give permission to participate. In addition, patient identity was confirmed by use of name plus two identifiers (DOB and address).  I spent a total of 5 mintalking with the patient or their proxy. CC: needs HTN med refills-labwork 7/19 cmp, u/a Subjective   Grace [name] is a 53 y.o. female established patient. Telephone visit today for refill on [place]  HPI Pt taking meds for HTN-HCTZ-needs refills  Patient Active Problem List   Diagnosis Date Noted  . Viral URI with cough 08/02/2014  . Abdominal pain, other specified site 04/03/2014  . Right hip pain 10/03/2013  . DUB (dysfunctional uterine bleeding) 10/03/2013  . Varicose veins of left lower extremity with complications 16/04/9603  . Varicose veins left leg w/both ulcer heel and midfoot and inflammation 11/22/2012  . Dysplastic nevus of trunk 12/14/2011  . Asthma with allergic rhinitis 03/03/2011  . GERD 01/31/2009  . BREAST CYSTS, RIGHT 02/10/2008  . MIGRAINE VARIANT 12/06/2007  . RENAL CALCULUS, RIGHT 11/15/2007  . Essential hypertension 04/05/2007  . ALLERGIC RHINITIS 02/10/2007    Past Medical History:  Diagnosis Date  . Allergy   . Arthritis   . Hypertension     Current Outpatient Medications  Medication Sig Dispense Refill  . hydrochlorothiazide (HYDRODIURIL) 25 MG tablet Take 1  tablet (25 mg total) by mouth daily. 90 tablet 0   No current facility-administered medications for this visit.     No Known Allergies  Social History   Socioeconomic History  . Marital status: Married    Spouse name: Not on file  . Number of children: Not on file  . Years of education: Not on file  . Highest education level: Not on file  Occupational History  . Not on file  Social Needs  . Financial resource strain: Not on file  . Food insecurity:    Worry: Not on file    Inability: Not on file  . Transportation needs:    Medical: Not on file    Non-medical: Not on file  Tobacco Use  . Smoking status: Former Smoker    Last attempt to quit: 01/21/1997    Years since quitting: 21.8  . Smokeless tobacco: Never Used  Substance and Sexual Activity  . Alcohol use: Yes    Alcohol/week: 2.0 standard drinks    Types: 2 Glasses of wine per week  . Drug use: No  . Sexual activity: Yes    Partners: Male    Birth control/protection: Post-menopausal  Lifestyle  . Physical activity:    Days per week: Not on file    Minutes per session: Not on file  . Stress: Not on file  Relationships  . Social connections:    Talks on phone: Not on file    Gets together: Not on file    Attends religious service: Not on file    Active member of club or organization: Not on file    Attends meetings  of clubs or organizations: Not on file    Relationship status: Not on file  . Intimate partner violence:    Fear of current or ex partner: Not on file    Emotionally abused: Not on file    Physically abused: Not on file    Forced sexual activity: Not on file  Other Topics Concern  . Not on file  Social History Narrative  . Not on file    ROS CONSTITUTIONAL: no  Fever CV: no chest pain RESP: no SOB or, cough NEURO WG:[id number] headaches   Objective   Vitals as reported by the patient:  none-does not take home bp Diagnoses and all orders for this visit:  Essential hypertension -      hydrochlorothiazide (HYDRODIURIL) 25 MG tablet; Take 1 tablet (25 mg total) by mouth daily. -     Cancel: Comprehensive metabolic panel -     Cancel: POCT URINALYSIS DIP (CLINITEK); Future -     Cancel: Hemoglobin A1c -     Cancel: POCT urinalysis dipstick -     Comprehensive metabolic panel; Future -     Hemoglobin A1c; Future -     POCT urinalysis dipstick; Future  d/w pt importance of monitoring with medication-will complete labwork w/i next 2 months for yearly evaluation   I discussed the assessment and treatment plan with the patient. The patient was provided an opportunity to ask questions and all were answered. The patient agreed with the plan and demonstrated an understanding of the instructions.   The patient was advised to call back or seek an in-person evaluation if the symptoms worsen or if the condition fails to improve as anticipated.  I provided 5 minutes of non-face-to-face time during this encounter.  Lanie Schelling Hannah Beat, MD  Primary [facility] at [facility] 12-01-18

## 2019-03-27 ENCOUNTER — Encounter: Payer: Self-pay | Admitting: Registered Nurse

## 2019-03-27 ENCOUNTER — Other Ambulatory Visit: Payer: Self-pay

## 2019-03-27 ENCOUNTER — Ambulatory Visit (INDEPENDENT_AMBULATORY_CARE_PROVIDER_SITE_OTHER): Payer: 59 | Admitting: Registered Nurse

## 2019-03-27 VITALS — BP 118/70 | HR 79 | Temp 98.6°F | Resp 16 | Ht 62.4 in | Wt 128.0 lb

## 2019-03-27 DIAGNOSIS — F5104 Psychophysiologic insomnia: Secondary | ICD-10-CM | POA: Diagnosis not present

## 2019-03-27 DIAGNOSIS — K5900 Constipation, unspecified: Secondary | ICD-10-CM | POA: Diagnosis not present

## 2019-03-27 DIAGNOSIS — Z13220 Encounter for screening for lipoid disorders: Secondary | ICD-10-CM

## 2019-03-27 DIAGNOSIS — I10 Essential (primary) hypertension: Secondary | ICD-10-CM

## 2019-03-27 DIAGNOSIS — Z23 Encounter for immunization: Secondary | ICD-10-CM | POA: Diagnosis not present

## 2019-03-27 DIAGNOSIS — Z13228 Encounter for screening for other metabolic disorders: Secondary | ICD-10-CM

## 2019-03-27 DIAGNOSIS — R102 Pelvic and perineal pain: Secondary | ICD-10-CM

## 2019-03-27 DIAGNOSIS — Z1329 Encounter for screening for other suspected endocrine disorder: Secondary | ICD-10-CM

## 2019-03-27 DIAGNOSIS — Z130 Encounter for screening for diseases of the blood and blood-forming organs and certain disorders involving the immune mechanism: Secondary | ICD-10-CM

## 2019-03-27 DIAGNOSIS — Z7689 Persons encountering health services in other specified circumstances: Secondary | ICD-10-CM

## 2019-03-27 LAB — POCT URINALYSIS DIP (MANUAL ENTRY)
Bilirubin, UA: NEGATIVE
Blood, UA: NEGATIVE
Glucose, UA: NEGATIVE mg/dL
Leukocytes, UA: NEGATIVE
Nitrite, UA: NEGATIVE
Protein Ur, POC: NEGATIVE mg/dL
Spec Grav, UA: 1.025 (ref 1.010–1.025)
Urobilinogen, UA: 0.2 E.U./dL
pH, UA: 7 (ref 5.0–8.0)

## 2019-03-27 MED ORDER — HYDROCHLOROTHIAZIDE 25 MG PO TABS: 25.0000 mg | ORAL_TABLET | Freq: Every day | ORAL | 3 refills | Status: DC

## 2019-03-27 MED ORDER — TRAZODONE HCL 50 MG PO TABS: 25.0000 mg | ORAL_TABLET | Freq: Every evening | ORAL | 1 refills | Status: DC | PRN

## 2019-03-27 NOTE — Progress Notes (Signed)
Established Patient Office Visit  Subjective:  Patient ID: Grace [name], female    DOB: August 18, 1965  Age: 53 y.o. MRN: [id number]  CC:  Chief Complaint  Patient presents with  . Abdominal Pain    pt state she was having lower abdominal pain with some back pain x 2 weeks   . Fatigue    with some dizziness and Nausea    HPI [facility] presents for LRQ pain and discomfort  Onset was around 2 weeks ago. Has since resolved. Localized to [facility] and some lower back towards r flank. Was constant but waned consistently until it resolved. It was dull pain - felt like bloating, fullness, firmness. Had some fatigue and nausea when this occurred. Noted intermittent constipation through her history - has had bad luck with laxatives in the past.  Otherwise, denies symptoms. Reports that there has been some concern for ovarian cysts in the past, but has not had imaging done.  In addition, having trouble falling asleep and staying asleep. Feels that she has been busy enough at work without enough at home due to [place] that she finds her mind racing.   Past Medical History:  Diagnosis Date  . Allergy   . Arthritis   . Hypertension     Past Surgical History:  Procedure Laterality Date  . DILATION AND CURETTAGE OF UTERUS  2005, 2006  . INGUINAL HERNIA REPAIR  1975   right    Family History  Problem Relation Age of Onset  . Colon polyps Mother   . Hypertension Mother   . Colon polyps Father   . Cancer Father        Prostate  . Hypertension Father   . Colon polyps Sister 4  . Colon polyps Brother 30  . Diabetes Paternal Grandmother   . Stroke Paternal Grandfather   . Hypertension Other        family hx  . Cancer Other        prostate  . Stomach cancer Neg Hx     Social History   Socioeconomic History  . Marital status: Married    Spouse name: Not on file  . Number of children: Not on file  . Years of education: Not on file  . Highest education level: Not on file   Occupational History  . Not on file  Social Needs  . Financial resource strain: Not on file  . Food insecurity    Worry: Not on file    Inability: Not on file  . Transportation needs    Medical: Not on file    Non-medical: Not on file  Tobacco Use  . Smoking status: Former Smoker    Quit date: 01/21/1997    Years since quitting: 22.1  . Smokeless tobacco: Never Used  Substance and Sexual Activity  . Alcohol use: Yes    Alcohol/week: 2.0 standard drinks    Types: 2 Glasses of wine per week  . Drug use: No  . Sexual activity: Yes    Partners: Male    Birth control/protection: Post-menopausal  Lifestyle  . Physical activity    Days per week: Not on file    Minutes per session: Not on file  . Stress: Not on file  Relationships  . Social Herbalist on phone: Not on file    Gets together: Not on file    Attends religious service: Not on file    Active member of club or organization: Not on  file    Attends meetings of clubs or organizations: Not on file    Relationship status: Not on file  . Intimate partner violence    Fear of current or ex partner: Not on file    Emotionally abused: Not on file    Physically abused: Not on file    Forced sexual activity: Not on file  Other Topics Concern  . Not on file  Social History Narrative  . Not on file    Outpatient Medications Prior to Visit  Medication Sig Dispense Refill  . hydrochlorothiazide (HYDRODIURIL) 25 MG tablet Take 1 tablet (25 mg total) by mouth daily. 90 tablet 0   No facility-administered medications prior to visit.     No Known Allergies  ROS Review of Systems  Constitutional: Negative.   HENT: Negative.   Eyes: Negative.   Respiratory: Negative.   Cardiovascular: Negative.   Gastrointestinal: Negative.   Endocrine: Negative.   Genitourinary: Negative.   Musculoskeletal: Negative.   Skin: Negative.   Allergic/Immunologic: Negative.   Neurological: Negative.   Hematological: Negative.    Psychiatric/Behavioral: Positive for sleep disturbance.  All other systems reviewed and are negative.     Objective:    Physical Exam  Constitutional: She appears well-developed and well-nourished. No distress.  Cardiovascular: Normal rate and regular rhythm.  Pulmonary/Chest: Effort normal. No respiratory distress.  Abdominal: Soft. Bowel sounds are normal. She exhibits no distension and no mass. There is no abdominal tenderness. There is no rebound and no guarding.  Skin: Skin is warm and dry. No rash noted. She is not diaphoretic. No erythema. No pallor.  Psychiatric: She has a normal mood and affect. Her behavior is normal. Judgment and thought content normal.  Nursing note and vitals reviewed.   BP 118/70   Pulse 79   Temp 98.6 F (37 C) (Oral)   Resp 16   Ht 5' 2.4" (1.585 m)   Wt 128 lb (58.1 kg)   LMP 10/26/2015 (Approximate)   SpO2 98%   BMI 23.11 kg/m  Wt Readings from Last 3 Encounters:  03/27/19 128 lb (58.1 kg)  06/27/18 125 lb (56.7 kg)  02/06/18 125 lb (56.7 kg)     There are no preventive care reminders to display for this patient.  There are no preventive care reminders to display for this patient.  Lab Results  Component Value Date   TSH 1.440 12/28/2016   Lab Results  Component Value Date   WBC 12.0 (H) 02/06/2018   HGB 13.5 02/06/2018   HCT 41.3 02/06/2018   MCV 90.2 02/06/2018   PLT 291 02/06/2018   Lab Results  Component Value Date   NA 138 02/06/2018   K 3.3 (L) 02/06/2018   CO2 22 02/06/2018   GLUCOSE 144 (H) 02/06/2018   BUN 16 02/06/2018   CREATININE 0.77 02/06/2018   BILITOT 0.9 02/06/2018   ALKPHOS 47 02/06/2018   AST 19 02/06/2018   ALT 15 02/06/2018   PROT 7.2 02/06/2018   ALBUMIN 4.3 02/06/2018   CALCIUM 9.5 02/06/2018   ANIONGAP 12 02/06/2018   GFR 81.56 12/11/2013   Lab Results  Component Value Date   CHOL 163 12/11/2013   Lab Results  Component Value Date   HDL 76.40 12/11/2013   Lab Results  Component  Value Date   LDLCALC 73 12/11/2013   Lab Results  Component Value Date   TRIG 66.0 12/11/2013   Lab Results  Component Value Date   CHOLHDL 2 12/11/2013  Lab Results  Component Value Date   HGBA1C 5.4 12/28/2016      Assessment & Plan:   Problem List Items Addressed This Visit      Cardiovascular and Mediastinum   Essential hypertension   Relevant Medications   hydrochlorothiazide (HYDRODIURIL) 25 MG tablet    Other Visit Diagnoses    Flu vaccine need    -  Primary   Relevant Orders   Flu Vaccine QUAD 36+ mos IM (Completed)   Screening for endocrine, metabolic and immunity disorder       Relevant Orders   CBC with Differential/Platelet   Comprehensive metabolic panel   TSH   Pelvic pain       Relevant Orders   POCT urinalysis dipstick (Completed)   US Pelvis Complete   Lipid screening       Relevant Orders   Lipid panel   Psychophysiological insomnia       Relevant Medications   traZODone (DESYREL) 50 MG tablet      Meds ordered this encounter  Medications  . traZODone (DESYREL) 50 MG tablet    Sig: Take 0.5-1 tablets (25-50 mg total) by mouth at bedtime as needed for sleep.    Dispense:  30 tablet    Refill:  1    Order Specific Question:   Supervising Provider    Answer:   Delia Chimes A [id number]  . hydrochlorothiazide (HYDRODIURIL) 25 MG tablet    Sig: Take 1 tablet (25 mg total) by mouth daily.    Dispense:  90 tablet    Refill:  3    Pt. Needs to schedule an appointment for further refills.    Order Specific Question:   Supervising Provider    Answer:   Forrest Moron [id number]    Follow-up: Return if symptoms worsen or fail to improve.   PLAN  Suspect constipation - will have her start on psyllium husk daily, increase water intake drastically, eat more insoluble fiber and plant based foods  In addition, trazodone 25mg  PO qpm for sleep aid  Ordering pelvic US to rule out ovarian cyst  Labs ordered - if CBC shows any elevated WBC,  may consider appendicitis but unlikely at this time  Return in 2 mos for follow up  Patient encouraged to call clinic with any questions, comments, or concerns.   Maximiano Coss, NP

## 2019-03-27 NOTE — Progress Notes (Signed)
0.2

## 2019-03-27 NOTE — Patient Instructions (Signed)
° ° ° °  If you have lab work done today you will be contacted with your lab results within the next 2 weeks.  If you have not heard from us then please contact us. The fastest way to get your results is to register for My Chart. ° ° °IF you received an x-ray today, you will receive an invoice from [facility]. Please contact [facility] at [phone] with questions or concerns regarding your invoice.  ° °IF you received labwork today, you will receive an invoice from [facility]. Please contact [facility] at [phone] with questions or concerns regarding your invoice.  ° °Our billing staff will not be able to assist you with questions regarding bills from these companies. ° °You will be contacted with the lab results as soon as they are available. The fastest way to get your results is to activate your My Chart account. Instructions are located on the last page of this paperwork. If you have not heard from us regarding the results in 2 weeks, please contact this office. °  ° ° ° °

## 2019-03-28 ENCOUNTER — Encounter: Payer: Self-pay | Admitting: Registered Nurse

## 2019-03-28 LAB — COMPREHENSIVE METABOLIC PANEL
ALT: 13 IU/L (ref 0–32)
AST: 18 IU/L (ref 0–40)
Albumin/Globulin Ratio: 2.1 (ref 1.2–2.2)
Albumin: 4.9 g/dL (ref 3.8–4.9)
Alkaline Phosphatase: 49 IU/L (ref 39–117)
BUN/Creatinine Ratio: 31 — ABNORMAL HIGH (ref 9–23)
BUN: 24 mg/dL (ref 6–24)
Bilirubin Total: 0.4 mg/dL (ref 0.0–1.2)
CO2: 26 mmol/L (ref 20–29)
Calcium: 9.8 mg/dL (ref 8.7–10.2)
Chloride: 100 mmol/L (ref 96–106)
Creatinine, Ser: 0.77 mg/dL (ref 0.57–1.00)
GFR calc Af Amer: 103 mL/min/{1.73_m2} (ref >59)
GFR calc non Af Amer: 89 mL/min/{1.73_m2} (ref >59)
Globulin, Total: 2.3 g/dL (ref 1.5–4.5)
Glucose: 95 mg/dL (ref 65–99)
Potassium: 3.4 mmol/L — ABNORMAL LOW (ref 3.5–5.2)
Sodium: 141 mmol/L (ref 134–144)
Total Protein: 7.2 g/dL (ref 6.0–8.5)

## 2019-03-28 LAB — CBC WITH DIFFERENTIAL/PLATELET
Basophils Absolute: 0 10*3/uL (ref 0.0–0.2)
Basos: 0 %
EOS (ABSOLUTE): 0.1 10*3/uL (ref 0.0–0.4)
Eos: 1 %
Hematocrit: 41.7 % (ref 34.0–46.6)
Hemoglobin: 13.9 g/dL (ref 11.1–15.9)
Immature Grans (Abs): 0 10*3/uL (ref 0.0–0.1)
Immature Granulocytes: 0 %
Lymphocytes Absolute: 1.3 10*3/uL (ref 0.7–3.1)
Lymphs: 26 %
MCH: 29.1 pg (ref 26.6–33.0)
MCHC: 33.3 g/dL (ref 31.5–35.7)
MCV: 87 fL (ref 79–97)
Monocytes Absolute: 0.4 10*3/uL (ref 0.1–0.9)
Monocytes: 8 %
Neutrophils Absolute: 3.2 10*3/uL (ref 1.4–7.0)
Neutrophils: 65 %
Platelets: 297 10*3/uL (ref 150–450)
RBC: 4.77 x10E6/uL (ref 3.77–5.28)
RDW: 13 % (ref 11.7–15.4)
WBC: 5 10*3/uL (ref 3.4–10.8)

## 2019-03-28 LAB — LIPID PANEL
Chol/HDL Ratio: 1.9 ratio (ref 0.0–4.4)
Cholesterol, Total: 201 mg/dL — ABNORMAL HIGH (ref 100–199)
HDL: 104 mg/dL (ref >39)
LDL Chol Calc (NIH): 87 mg/dL (ref 0–99)
Triglycerides: 55 mg/dL (ref 0–149)
VLDL Cholesterol Cal: 10 mg/dL (ref 5–40)

## 2019-03-28 LAB — TSH: TSH: 1.54 u[IU]/mL (ref 0.450–4.500)

## 2019-03-28 NOTE — Progress Notes (Signed)
Results letter sent to patient via MyChart  Rich Mikele Sifuentes, NP 

## 2019-04-13 ENCOUNTER — Other Ambulatory Visit: Payer: Self-pay | Admitting: Registered Nurse

## 2019-04-13 DIAGNOSIS — R102 Pelvic and perineal pain: Secondary | ICD-10-CM

## 2019-04-18 ENCOUNTER — Ambulatory Visit
Admission: RE | Admit: 2019-04-18 | Discharge: 2019-04-18 | Disposition: A | Discharge status: Home / Self Care | Payer: 59 | Source: Ambulatory Visit | Attending: Registered Nurse | Admitting: Registered Nurse

## 2019-04-18 ENCOUNTER — Encounter: Payer: 59 | Admitting: Family Medicine

## 2019-04-18 DIAGNOSIS — R102 Pelvic and perineal pain: Secondary | ICD-10-CM

## 2019-05-26 ENCOUNTER — Ambulatory Visit: Payer: 59 | Admitting: Registered Nurse

## 2019-06-05 ENCOUNTER — Ambulatory Visit: Payer: 59 | Admitting: Registered Nurse

## 2019-06-05 ENCOUNTER — Encounter: Payer: Self-pay | Admitting: Registered Nurse

## 2019-06-05 ENCOUNTER — Other Ambulatory Visit: Payer: Self-pay

## 2019-06-05 VITALS — BP 132/79 | HR 74 | Temp 99.0°F | Resp 16 | Wt 130.0 lb

## 2019-06-05 DIAGNOSIS — Z23 Encounter for immunization: Secondary | ICD-10-CM

## 2019-06-05 DIAGNOSIS — G4733 Obstructive sleep apnea (adult) (pediatric): Secondary | ICD-10-CM | POA: Diagnosis not present

## 2019-06-05 DIAGNOSIS — S46812A Strain of other muscles, fascia and tendons at shoulder and upper arm level, left arm, initial encounter: Secondary | ICD-10-CM

## 2019-06-05 MED ORDER — CYCLOBENZAPRINE HCL 5 MG PO TABS: 5.0000 mg | ORAL_TABLET | Freq: Three times a day (TID) | ORAL | 1 refills | Status: DC | PRN

## 2019-06-05 MED ORDER — MELOXICAM 7.5 MG PO TABS: 7.5000 mg | ORAL_TABLET | Freq: Every day | ORAL | 0 refills | Status: DC

## 2019-06-05 NOTE — Patient Instructions (Signed)
° ° ° °  If you have lab work done today you will be contacted with your lab results within the next 2 weeks.  If you have not heard from us then please contact us. The fastest way to get your results is to register for My Chart. ° ° °IF you received an x-ray today, you will receive an invoice from [facility]. Please contact [facility] at [phone] with questions or concerns regarding your invoice.  ° °IF you received labwork today, you will receive an invoice from [facility]. Please contact [facility] at [phone] with questions or concerns regarding your invoice.  ° °Our billing staff will not be able to assist you with questions regarding bills from these companies. ° °You will be contacted with the lab results as soon as they are available. The fastest way to get your results is to activate your My Chart account. Instructions are located on the last page of this paperwork. If you have not heard from us regarding the results in 2 weeks, please contact this office. °  ° ° ° °

## 2019-06-05 NOTE — Progress Notes (Signed)
Established Patient Office Visit  Subjective:  Patient ID: Grace [name], female    DOB: August 11, 1965  Age: 53 y.o. MRN: [id number]  CC:  Chief Complaint  Patient presents with  . Chronic Conditions    2 month follow-up     HPI [facility] presents for follow up visit.  At last visit we addressed: pelvic pain, sleep disturbance, and HTN.   Pelvic pain: pelvic ultrasound. Normal exam. Pain has since resolved.  Sleep disturbance: took trazodone once. Overall improving. However, reports she has been snoring with witnessed apnea and daytime sleepiness.   HTN: HCTZ 25mg  Po qd with good effect. Will continue.  New concern of trapezius strain on R side. Started on Saturday. No acute injury. Reports some numbness down arm. Limited ROM in neck. Does not feel that there is any bony abnormality, feels like a muscular concern.   Past Medical History:  Diagnosis Date  . Allergy   . Arthritis   . Hypertension     Past Surgical History:  Procedure Laterality Date  . DILATION AND CURETTAGE OF UTERUS  2005, 2006  . INGUINAL HERNIA REPAIR  1975   right    Family History  Problem Relation Age of Onset  . Colon polyps Mother   . Hypertension Mother   . Colon polyps Father   . Cancer Father        Prostate  . Hypertension Father   . Colon polyps Sister 58  . Colon polyps Brother 75  . Diabetes Paternal Grandmother   . Stroke Paternal Grandfather   . Hypertension Other        family hx  . Cancer Other        prostate  . Stomach cancer Neg Hx     Social History   Socioeconomic History  . Marital status: Married    Spouse name: Not on file  . Number of children: Not on file  . Years of education: Not on file  . Highest education level: Not on file  Occupational History  . Not on file  Social Needs  . Financial resource strain: Not on file  . Food insecurity    Worry: Not on file    Inability: Not on file  . Transportation needs    Medical: Not on file   Non-medical: Not on file  Tobacco Use  . Smoking status: Former Smoker    Quit date: 01/21/1997    Years since quitting: 22.3  . Smokeless tobacco: Never Used  Substance and Sexual Activity  . Alcohol use: Yes    Alcohol/week: 2.0 standard drinks    Types: 2 Glasses of wine per week  . Drug use: No  . Sexual activity: Yes    Partners: Male    Birth control/protection: Post-menopausal  Lifestyle  . Physical activity    Days per week: Not on file    Minutes per session: Not on file  . Stress: Not on file  Relationships  . Social Herbalist on phone: Not on file    Gets together: Not on file    Attends religious service: Not on file    Active member of club or organization: Not on file    Attends meetings of clubs or organizations: Not on file    Relationship status: Not on file  . Intimate partner violence    Fear of current or ex partner: Not on file    Emotionally abused: Not on file  Physically abused: Not on file    Forced sexual activity: Not on file  Other Topics Concern  . Not on file  Social History Narrative  . Not on file    Outpatient Medications Prior to Visit  Medication Sig Dispense Refill  . hydrochlorothiazide (HYDRODIURIL) 25 MG tablet Take 1 tablet (25 mg total) by mouth daily. 90 tablet 3  . traZODone (DESYREL) 50 MG tablet Take 0.5-1 tablets (25-50 mg total) by mouth at bedtime as needed for sleep. 30 tablet 1   No facility-administered medications prior to visit.     No Known Allergies  ROS Review of Systems  Constitutional: Negative.   HENT: Negative.   Eyes: Negative.   Respiratory: Negative.   Cardiovascular: Negative.   Gastrointestinal: Negative.   Endocrine: Negative.   Genitourinary: Negative.  Negative for pelvic pain.  Musculoskeletal: Positive for myalgias, neck pain and neck stiffness. Negative for arthralgias, back pain, gait problem and joint swelling.  Skin: Negative.   Allergic/Immunologic: Negative.    Neurological: Negative.   Hematological: Negative.   Psychiatric/Behavioral: Positive for sleep disturbance.  All other systems reviewed and are negative.     Objective:    Physical Exam  Constitutional: She is oriented to person, place, and time. She appears well-developed and well-nourished. She appears distressed.  Cardiovascular: Normal rate and regular rhythm.  Pulmonary/Chest: Effort normal. No respiratory distress.  Neurological: She is alert and oriented to person, place, and time.  Skin: Skin is warm and dry. No rash noted. She is not diaphoretic. No erythema. No pallor.  Psychiatric: She has a normal mood and affect. Her behavior is normal. Judgment and thought content normal.  Nursing note and vitals reviewed.   BP 132/79   Pulse 74   Temp 99 F (37.2 C) (Oral)   Resp 16   Wt 130 lb (59 kg)   LMP 10/26/2015 (Approximate)   SpO2 97%   BMI 23.47 kg/m  Wt Readings from Last 3 Encounters:  06/05/19 130 lb (59 kg)  03/27/19 128 lb (58.1 kg)  06/27/18 125 lb (56.7 kg)     There are no preventive care reminders to display for this patient.  There are no preventive care reminders to display for this patient.  Lab Results  Component Value Date   TSH 1.540 03/27/2019   Lab Results  Component Value Date   WBC 5.0 03/27/2019   HGB 13.9 03/27/2019   HCT 41.7 03/27/2019   MCV 87 03/27/2019   PLT 297 03/27/2019   Lab Results  Component Value Date   NA 141 03/27/2019   K 3.4 (L) 03/27/2019   CO2 26 03/27/2019   GLUCOSE 95 03/27/2019   BUN 24 03/27/2019   CREATININE 0.77 03/27/2019   BILITOT 0.4 03/27/2019   ALKPHOS 49 03/27/2019   AST 18 03/27/2019   ALT 13 03/27/2019   PROT 7.2 03/27/2019   ALBUMIN 4.9 03/27/2019   CALCIUM 9.8 03/27/2019   ANIONGAP 12 02/06/2018   GFR 81.56 12/11/2013   Lab Results  Component Value Date   CHOL 201 (H) 03/27/2019   Lab Results  Component Value Date   HDL 104 03/27/2019   Lab Results  Component Value Date    LDLCALC 87 03/27/2019   Lab Results  Component Value Date   TRIG 55 03/27/2019   Lab Results  Component Value Date   CHOLHDL 1.9 03/27/2019   Lab Results  Component Value Date   HGBA1C 5.4 12/28/2016      Assessment &  Plan:   Problem List Items Addressed This Visit    None    Visit Diagnoses    Immunization, varicella    -  Primary   Relevant Orders   Varicella zoster antibody, IgG   Trapezius strain, left, initial encounter       Relevant Medications   meloxicam (MOBIC) 7.5 MG tablet   cyclobenzaprine (FLEXERIL) 5 MG tablet   Obstructive sleep apnea syndrome       Relevant Orders   Ambulatory referral to Pulmonology      Meds ordered this encounter  Medications  . meloxicam (MOBIC) 7.5 MG tablet    Sig: Take 1 tablet (7.5 mg total) by mouth daily.    Dispense:  30 tablet    Refill:  0    Order Specific Question:   Supervising Provider    Answer:   Delia Chimes A [id number]  . cyclobenzaprine (FLEXERIL) 5 MG tablet    Sig: Take 1 tablet (5 mg total) by mouth 3 (three) times daily as needed for muscle spasms.    Dispense:  30 tablet    Refill:  1    Order Specific Question:   Supervising Provider    Answer:   Forrest Moron [id number]    Follow-up: Return in about 1 year (around 06/04/2020).   PLAN  Pt would benefit from home sleep study. Referral to pulm sent  Meloxicam 7.5mg  PO qd and flexeril 5mg  PO qhs for trapezius strain. Return if symptoms worsen or fail to improve  Return in 1 year for HTN follow up and refill  Patient encouraged to call clinic with any questions, comments, or concerns.   Maximiano Coss, NP

## 2019-06-06 ENCOUNTER — Encounter: Payer: Self-pay | Admitting: Registered Nurse

## 2019-06-06 LAB — VARICELLA ZOSTER ANTIBODY, IGG: Varicella zoster IgG: 1234 index (ref >165)

## 2019-06-06 NOTE — Progress Notes (Signed)
Varicella titer shows immunity Letter sent via [place], NP

## 2019-06-09 ENCOUNTER — Telehealth (INDEPENDENT_AMBULATORY_CARE_PROVIDER_SITE_OTHER): Payer: 59 | Admitting: Registered Nurse

## 2019-06-09 ENCOUNTER — Other Ambulatory Visit: Payer: Self-pay

## 2019-06-09 ENCOUNTER — Encounter: Payer: Self-pay | Admitting: Registered Nurse

## 2019-06-09 DIAGNOSIS — J069 Acute upper respiratory infection, unspecified: Secondary | ICD-10-CM

## 2019-06-09 DIAGNOSIS — B9689 Other specified bacterial agents as the cause of diseases classified elsewhere: Secondary | ICD-10-CM | POA: Diagnosis not present

## 2019-06-09 DIAGNOSIS — Z20822 Contact with and (suspected) exposure to covid-19: Secondary | ICD-10-CM

## 2019-06-09 MED ORDER — AZITHROMYCIN 250 MG PO TABS: ORAL_TABLET | ORAL | 0 refills | Status: DC

## 2019-06-09 MED ORDER — FLUTICASONE PROPIONATE 50 MCG/ACT NA SUSP: 2.0000 | Freq: Every day | NASAL | 6 refills | Status: DC

## 2019-06-09 NOTE — Progress Notes (Signed)
Telemedicine Encounter- SOAP NOTE Established Patient  This telephone encounter was conducted with the patient's (or proxy's) verbal consent via audio telecommunications: yes   Patient was instructed to have this encounter in a suitably private space; and to only have persons present to whom they give permission to participate. In addition, patient identity was confirmed by use of name plus two identifiers (DOB and address).  I discussed the limitations, risks, security and privacy concerns of performing an evaluation and management service by telephone and the availability of in person appointments. I also discussed with the patient that there may be a patient responsible charge related to this service. The patient expressed understanding and agreed to proceed.  I spent a total of 12 minutes talking with the patient or their proxy.  No chief complaint on file.   Subjective   Grace [name] is a 53 y.o. established patient. Telephone visit today for flu like symptoms  HPI Pt notes that after her visit in our office on Monday, she noted that she started having some aches, congestion, and fever. Temp reached 100.7 this morning. Body aches are improving. She is reporting some changes to her sense of taste and smell.  No known sick contacts. Coughing feels like it is a result of PND. shob from mouth breathing d/t nasal congestion. Feels better than onset, but still very under the weather.   Patient Active Problem List   Diagnosis Date Noted  . Viral URI with cough 08/02/2014  . Abdominal pain, other specified site 04/03/2014  . Right hip pain 10/03/2013  . DUB (dysfunctional uterine bleeding) 10/03/2013  . Varicose veins of left lower extremity with complications [id number]  . Varicose veins left leg w/both ulcer heel and midfoot and inflammation 11/22/2012  . Dysplastic nevus of trunk 12/14/2011  . Asthma with allergic rhinitis 03/03/2011  . GERD 01/31/2009  . BREAST CYSTS, RIGHT  02/10/2008  . MIGRAINE VARIANT 12/06/2007  . RENAL CALCULUS, RIGHT 11/15/2007  . Essential hypertension 04/05/2007  . ALLERGIC RHINITIS 02/10/2007    Past Medical History:  Diagnosis Date  . Allergy   . Arthritis   . Hypertension     Current Outpatient Medications  Medication Sig Dispense Refill  . cyclobenzaprine (FLEXERIL) 5 MG tablet Take 1 tablet (5 mg total) by mouth 3 (three) times daily as needed for muscle spasms. 30 tablet 1  . hydrochlorothiazide (HYDRODIURIL) 25 MG tablet Take 1 tablet (25 mg total) by mouth daily. 90 tablet 3  . meloxicam (MOBIC) 7.5 MG tablet Take 1 tablet (7.5 mg total) by mouth daily. 30 tablet 0  . traZODone (DESYREL) 50 MG tablet Take 0.5-1 tablets (25-50 mg total) by mouth at bedtime as needed for sleep. 30 tablet 1  . azithromycin (ZITHROMAX) 250 MG tablet Take 2 tabs on first day. Then take 1 tab daily. Finish entire supply. 6 tablet 0  . fluticasone (FLONASE) 50 MCG/ACT nasal spray Place 2 sprays into both nostrils daily. 16 g 6   No current facility-administered medications for this visit.     No Known Allergies  Social History   Socioeconomic History  . Marital status: Married    Spouse name: Not on file  . Number of children: Not on file  . Years of education: Not on file  . Highest education level: Not on file  Occupational History  . Not on file  Social Needs  . Financial resource strain: Not hard at all  . Food insecurity    Worry: Never  true    Inability: Never true  . Transportation needs    Medical: No    Non-medical: No  Tobacco Use  . Smoking status: Former Smoker    Quit date: 01/21/1997    Years since quitting: 22.3  . Smokeless tobacco: Never Used  Substance and Sexual Activity  . Alcohol use: Yes    Alcohol/week: 2.0 standard drinks    Types: 2 Glasses of wine per week  . Drug use: No  . Sexual activity: Yes    Partners: Male    Birth control/protection: Post-menopausal  Lifestyle  . Physical activity     Days per week: Not on file    Minutes per session: Not on file  . Stress: Not on file  Relationships  . Social Herbalist on phone: Not on file    Gets together: Not on file    Attends religious service: Not on file    Active member of club or organization: Not on file    Attends meetings of clubs or organizations: Not on file    Relationship status: Not on file  . Intimate partner violence    Fear of current or ex partner: Not on file    Emotionally abused: Not on file    Physically abused: Not on file    Forced sexual activity: Not on file  Other Topics Concern  . Not on file  Social History Narrative  . Not on file    ROS  Objective   Vitals as reported by the patient: There were no vitals filed for this visit.  Diagnoses and all orders for this visit:  Bacterial URI -     azithromycin (ZITHROMAX) 250 MG tablet; Take 2 tabs on first day. Then take 1 tab daily. Finish entire supply. -     fluticasone (FLONASE) 50 MCG/ACT nasal spray; Place 2 sprays into both nostrils daily.   PLAN  Given fever and progression of symptoms, concerned for bacterial etiology. Will treat as such. z pack and flonase  Return to clinic if symptoms worsen or fail to improve  Patient encouraged to call clinic with any questions, comments, or concerns.    I discussed the assessment and treatment plan with the patient. The patient was provided an opportunity to ask questions and all were answered. The patient agreed with the plan and demonstrated an understanding of the instructions.   The patient was advised to call back or seek an in-person evaluation if the symptoms worsen or if the condition fails to improve as anticipated.  I provided 12 minutes of non-face-to-face time during this encounter.  Maximiano Coss, NP  Primary [facility] at [facility]

## 2019-06-09 NOTE — Progress Notes (Signed)
Spoke with pt and she informed me that she has been having some COVID symptoms. She states she noticed some fever,nasal congestion, and some bodyaches since Wednesday. She states her TEMP. Today is 100.7. She states it has come down after taking some OTC medication.

## 2019-06-12 LAB — NOVEL CORONAVIRUS, NAA: SARS-CoV-2, NAA: DETECTED — AB

## 2019-06-13 ENCOUNTER — Ambulatory Visit: Payer: Self-pay

## 2019-06-13 NOTE — Telephone Encounter (Signed)
Provided Covid lab results.  Provided care advice also voiced understanding.  Patient reports a temp.  Of 99.4.

## 2019-07-03 ENCOUNTER — Ambulatory Visit: Payer: 59 | Admitting: Obstetrics and Gynecology

## 2019-07-07 ENCOUNTER — Other Ambulatory Visit: Payer: Self-pay | Admitting: Obstetrics and Gynecology

## 2019-07-07 DIAGNOSIS — Z1231 Encounter for screening mammogram for malignant neoplasm of breast: Secondary | ICD-10-CM

## 2019-07-10 ENCOUNTER — Ambulatory Visit
Admission: RE | Admit: 2019-07-10 | Discharge: 2019-07-10 | Disposition: A | Discharge status: Home / Self Care | Payer: 59 | Source: Ambulatory Visit | Attending: Obstetrics and Gynecology | Admitting: Obstetrics and Gynecology

## 2019-07-10 ENCOUNTER — Other Ambulatory Visit: Payer: Self-pay

## 2019-07-10 DIAGNOSIS — Z1231 Encounter for screening mammogram for malignant neoplasm of breast: Secondary | ICD-10-CM

## 2019-07-19 ENCOUNTER — Other Ambulatory Visit: Payer: Self-pay

## 2019-07-24 ENCOUNTER — Encounter: Payer: Self-pay | Admitting: Obstetrics and Gynecology

## 2019-07-24 ENCOUNTER — Ambulatory Visit: Payer: 59 | Admitting: Obstetrics and Gynecology

## 2019-07-24 ENCOUNTER — Other Ambulatory Visit: Payer: Self-pay

## 2019-07-24 VITALS — BP 124/82 | HR 80 | Temp 97.9°F | Ht 62.75 in | Wt 127.8 lb

## 2019-07-24 DIAGNOSIS — Z01419 Encounter for gynecological examination (general) (routine) without abnormal findings: Secondary | ICD-10-CM

## 2019-07-24 DIAGNOSIS — N9411 Superficial (introital) dyspareunia: Secondary | ICD-10-CM | POA: Diagnosis not present

## 2019-07-24 DIAGNOSIS — N952 Postmenopausal atrophic vaginitis: Secondary | ICD-10-CM

## 2019-07-24 NOTE — Patient Instructions (Addendum)
EXERCISE AND DIET:  We recommended that you start or continue a regular exercise program for good health. Regular exercise means any activity that makes your heart beat faster and makes you sweat.  We recommend exercising at least 30 minutes per day at least 3 days a week, preferably 4 or 5.  We also recommend a diet low in fat and sugar.  Inactivity, poor dietary choices and obesity can cause diabetes, heart attack, stroke, and kidney damage, among others.    ALCOHOL AND SMOKING:  Women should limit their alcohol intake to no more than 7 drinks/beers/glasses of wine (combined, not each!) per week. Moderation of alcohol intake to this level decreases your risk of breast cancer and liver damage. And of course, no recreational drugs are part of a healthy lifestyle.  And absolutely no smoking or even second hand smoke. Most people know smoking can cause heart and lung diseases, but did you know it also contributes to weakening of your bones? Aging of your skin?  Yellowing of your teeth and nails?  CALCIUM AND VITAMIN D:  Adequate intake of calcium and Vitamin D are recommended.  The recommendations for exact amounts of these supplements seem to change often, but generally speaking 1,000 mg of calcium (between diet and supplement) and 800 units of Vitamin D per day seems prudent. Certain women may benefit from higher intake of Vitamin D.  If you are among these women, your doctor will have told you during your visit.    PAP SMEARS:  Pap smears, to check for cervical cancer or precancers,  have traditionally been done yearly, although recent scientific advances have shown that most women can have pap smears less often.  However, every woman still should have a physical exam from her gynecologist every year. It will include a breast check, inspection of the vulva and vagina to check for abnormal growths or skin changes, a visual exam of the cervix, and then an exam to evaluate the size and shape of the uterus and  ovaries.  And after 53 years of age, a rectal exam is indicated to check for rectal cancers. We will also provide age appropriate advice regarding health maintenance, like when you should have certain vaccines, screening for sexually transmitted diseases, bone density testing, colonoscopy, mammograms, etc.   MAMMOGRAMS:  All women over 40 years old should have a yearly mammogram. Many facilities now offer a "3D" mammogram, which may cost around $50 extra out of pocket. If possible,  we recommend you accept the option to have the 3D mammogram performed.  It both reduces the number of women who will be called back for extra views which then turn out to be normal, and it is better than the routine mammogram at detecting truly abnormal areas.    COLON CANCER SCREENING: Now recommend starting at age 45. At this time colonoscopy is not covered for routine screening until 50. There are take home tests that can be done between 45-49.   COLONOSCOPY:  Colonoscopy to screen for colon cancer is recommended for all women at age 50.  We know, you hate the idea of the prep.  We agree, BUT, having colon cancer and not knowing it is worse!!  Colon cancer so often starts as a polyp that can be seen and removed at colonscopy, which can quite literally save your life!  And if your first colonoscopy is normal and you have no family history of colon cancer, most women don't have to have it again for   10 years.  Once every ten years, you can do something that may end up saving your life, right?  We will be happy to help you get it scheduled when you are ready.  Be sure to check your insurance coverage so you understand how much it will cost.  It may be covered as a preventative service at no cost, but you should check your particular policy.      Breast Self-Awareness Breast self-awareness means being familiar with how your breasts look and feel. It involves checking your breasts regularly and reporting any changes to your  health care provider. Practicing breast self-awareness is important. A change in your breasts can be a sign of a serious medical problem. Being familiar with how your breasts look and feel allows you to find any problems early, when treatment is more likely to be successful. All women should practice breast self-awareness, including women who have had breast implants. How to do a breast self-exam One way to learn what is normal for your breasts and whether your breasts are changing is to do a breast self-exam. To do a breast self-exam: Look for Changes  1. Remove all the clothing above your waist. 2. Stand in front of a mirror in a room with good lighting. 3. Put your hands on your hips. 4. Push your hands firmly downward. 5. Compare your breasts in the mirror. Look for differences between them (asymmetry), such as: ? Differences in shape. ? Differences in size. ? Puckers, dips, and bumps in one breast and not the other. 6. Look at each breast for changes in your skin, such as: ? Redness. ? Scaly areas. 7. Look for changes in your nipples, such as: ? Discharge. ? Bleeding. ? Dimpling. ? Redness. ? A change in position. Feel for Changes Carefully feel your breasts for lumps and changes. It is best to do this while lying on your back on the floor and again while sitting or standing in the shower or tub with soapy water on your skin. Feel each breast in the following way:  Place the arm on the side of the breast you are examining above your head.  Feel your breast with the other hand.  Start in the nipple area and make  inch (2 cm) overlapping circles to feel your breast. Use the pads of your three middle fingers to do this. Apply light pressure, then medium pressure, then firm pressure. The light pressure will allow you to feel the tissue closest to the skin. The medium pressure will allow you to feel the tissue that is a little deeper. The firm pressure will allow you to feel the tissue  close to the ribs.  Continue the overlapping circles, moving downward over the breast until you feel your ribs below your breast.  Move one finger-width toward the center of the body. Continue to use the  inch (2 cm) overlapping circles to feel your breast as you move slowly up toward your collarbone.  Continue the up and down exam using all three pressures until you reach your armpit.  Write Down What You Find  Write down what is normal for each breast and any changes that you find. Keep a written record with breast changes or normal findings for each breast. By writing this information down, you do not need to depend only on memory for size, tenderness, or location. Write down where you are in your menstrual cycle, if you are still menstruating. If you are having trouble noticing differences   in your breasts, do not get discouraged. With time you will become more familiar with the variations in your breasts and more comfortable with the exam. How often should I examine my breasts? Examine your breasts every month. If you are breastfeeding, the best time to examine your breasts is after a feeding or after using a breast pump. If you menstruate, the best time to examine your breasts is 5-7 days after your period is over. During your period, your breasts are lumpier, and it may be more difficult to notice changes. When should I see my health care provider? See your health care provider if you notice:  A change in shape or size of your breasts or nipples.  A change in the skin of your breast or nipples, such as a reddened or scaly area.  Unusual discharge from your nipples.  A lump or thick area that was not there before.  Pain in your breasts.  Anything that concerns you.  Atrophic Vaginitis  Atrophic vaginitis is a condition in which the tissues that line the vagina become dry and thin. This condition is most common in women who have stopped having regular menstrual periods (are in  menopause). This usually starts when a woman is 45-55 years old. That is the time when a woman's estrogen levels begin to drop (decrease). Estrogen is a female hormone. It helps to keep the tissues of the vagina moist. It stimulates the vagina to produce a clear fluid that lubricates the vagina for sexual intercourse. This fluid also protects the vagina from infection. Lack of estrogen can cause the lining of the vagina to get thinner and dryer. The vagina may also shrink in size. It may become less elastic. Atrophic vaginitis tends to get worse over time as a woman's estrogen level drops. What are the causes? This condition is caused by the normal drop in estrogen that happens around the time of menopause. What increases the risk? Certain conditions or situations may lower a woman's estrogen level, leading to a higher risk for atrophic vaginitis. You are more likely to develop this condition if:  You are taking medicines that block estrogen.  You have had your ovaries removed.  You are being treated for cancer with X-ray (radiation) or medicines (chemotherapy).  You have given birth or are breastfeeding.  You are older than age 50.  You smoke. What are the signs or symptoms? Symptoms of this condition include:  Pain, soreness, or bleeding during sexual intercourse (dyspareunia).  Vaginal burning, irritation, or itching.  Pain or bleeding when a speculum is used in a vaginal exam (pelvic exam).  Having burning pain when passing urine.  Vaginal discharge that is brown or yellow. In some cases, there are no symptoms. How is this diagnosed? This condition is diagnosed by taking a medical history and doing a physical exam. This will include a pelvic exam that checks the vaginal tissues. Though rare, you may also have other tests, including:  A urine test.  A test that checks the acid balance in your vagina (acid balance test). How is this treated? Treatment for this condition  depends on how severe your symptoms are. Treatment may include:  Using an over-the-counter vaginal lubricant before sex.  Using a long-acting vaginal moisturizer.  Using low-dose vaginal estrogen for moderate to severe symptoms that do not respond to other treatments. Options include creams, tablets, and inserts (vaginal rings). Before you use a vaginal estrogen, tell your health care provider if you have a history of: ?   of: ? Breast cancer. ? Endometrial cancer. ? Blood clots. If you are not sexually active and your symptoms are very mild, you may not need treatment. Follow these instructions at home: Medicines  Take over-the-counter and prescription medicines only as told by your health care provider. Do not use herbal or alternative medicines unless your health care provider says that you can.  Use over-the-counter creams, lubricants, or moisturizers for dryness only as directed by your health care provider. General instructions  If your atrophic vaginitis is caused by menopause, discuss all of your menopause symptoms and treatment options with your health care provider.  Do not douche.  Do not use products that can make your vagina dry. These include: ? Scented feminine sprays. ? Scented tampons. ? Scented soaps.  Vaginal intercourse can help to improve blood flow and elasticity of vaginal tissue. If it hurts to have sex, try using a lubricant or moisturizer just before having intercourse. Contact a health care provider if:  Your discharge looks different than normal.  Your vagina has an unusual smell.  You have new symptoms.  Your symptoms do not improve with treatment.  Your symptoms get worse. Summary  Atrophic vaginitis is a condition in which the tissues that line the vagina become dry and thin. It is most common in women who have stopped having regular menstrual periods (are in menopause).  Treatment options include using vaginal lubricants and low-dose vaginal  estrogen.  Contact a health care provider if your vagina has an unusual smell, or if your symptoms get worse or do not improve after treatment. This information is not intended to replace advice given to you by your health care provider. Make sure you discuss any questions you have with your health care provider. Document Released: 11/27/2014 Document Revised: 06/25/2017 Document Reviewed: 04/08/2017 Elsevier Patient Education  2020 [employer].

## 2019-07-24 NOTE — Progress Notes (Signed)
53 y.o. G26P0020 Married White or Caucasian Not Hispanic or Latino female here for annual exam.  No vaginal bleeding. Some entry dyspareunia, helped with a lubricant. Currently tolerable. No bowel or bladder c/o.     Patient's last menstrual period was 10/26/2015 (approximate).          Sexually active: Yes.    The current method of family planning is post menopausal status.    Exercising: No.  The patient does not participate in regular exercise at present. Smoker:  No  She had Covid in November. Doing much better.   Health Maintenance: Pap:06/27/2018 WNL NEG HPV, 04-18-15 WNL NEG HR HPV, 01-02-14 WNL History of abnormal Pap:Yes- years ago MMG:07/10/2019 Birads 1 negative Colonoscopy:03-04-12 WNL, both parents with pre-cancerous polyps. Colonoscopy due in 2023 (per GI) BMD:N/A TDaP:12-04-11 Gardasil:N/A   reports that she quit smoking about 22 years ago. She has never used smokeless tobacco. She reports current alcohol use of about 2.0 standard drinks of alcohol per week. She reports that she does not use drugs. She works for a [employer], [employer]/product. She has a 61 year old daughter.   Past Medical History:  Diagnosis Date  . Allergy   . Arthritis   . Hypertension     Past Surgical History:  Procedure Laterality Date  . DILATION AND CURETTAGE OF UTERUS  2005, 2006  . INGUINAL HERNIA REPAIR  1975   right    Current Outpatient Medications  Medication Sig Dispense Refill  . fluticasone (FLONASE) 50 MCG/ACT nasal spray Place 2 sprays into both nostrils daily. 16 g 6  . hydrochlorothiazide (HYDRODIURIL) 25 MG tablet Take 1 tablet (25 mg total) by mouth daily. 90 tablet 3  . cyclobenzaprine (FLEXERIL) 5 MG tablet Take 1 tablet (5 mg total) by mouth 3 (three) times daily as needed for muscle spasms. (Patient not taking: Reported on 07/24/2019) 30 tablet 1   No current facility-administered medications for this visit.    Family History  Problem Relation Age of Onset  . Colon  polyps Mother   . Hypertension Mother   . Colon polyps Father   . Cancer Father        Prostate  . Hypertension Father   . Colon polyps Sister 50  . Colon polyps Brother 65  . Diabetes Paternal Grandmother   . Stroke Paternal Grandfather   . Hypertension Other        family hx  . Cancer Other        prostate  . Stomach cancer Neg Hx     Review of Systems  Constitutional: Negative.   HENT: Negative.   Eyes: Negative.   Respiratory: Negative.   Cardiovascular: Negative.   Gastrointestinal: Negative.   Endocrine: Negative.   Genitourinary: Negative.   Musculoskeletal: Negative.   Skin: Negative.   Allergic/Immunologic: Negative.   Neurological: Negative.   Hematological: Negative.   Psychiatric/Behavioral: Negative.     Exam:   BP 124/82 (BP Location: Right Arm, Patient Position: Sitting, Cuff Size: Normal)   Pulse 80   Temp 97.9 F (36.6 C) (Skin)   Ht 5' 2.75" (1.594 m)   Wt 127 lb 12.8 oz (58 kg)   LMP 10/26/2015 (Approximate)   BMI 22.82 kg/m   Weight change: @WEIGHTCHANGE @ Height:   Height: 5' 2.75" (159.4 cm)  Ht Readings from Last 3 Encounters:  07/24/19 5' 2.75" (1.594 m)  03/27/19 5' 2.4" (1.585 m)  06/27/18 5' 2.75" (1.594 m)    General appearance: alert, cooperative and appears stated age  Head: Normocephalic, without obvious abnormality, atraumatic Neck: no adenopathy, supple, symmetrical, trachea midline and thyroid normal to inspection and palpation Lungs: clear to auscultation bilaterally Cardiovascular: regular rate and rhythm Breasts: normal appearance, no masses or tenderness Abdomen: soft, non-tender; non distended,  no masses,  no organomegaly Extremities: extremities normal, atraumatic, no cyanosis or edema Skin: Skin color, texture, turgor normal. No rashes or lesions Lymph nodes: Cervical, supraclavicular, and axillary nodes normal. No abnormal inguinal nodes palpated Neurologic: Grossly normal   Pelvic: External genitalia:  no  lesions              Urethra:  normal appearing urethra with no masses, tenderness or lesions              Bartholins and Skenes: normal                 Vagina: atrophic appearing vagina with normal color and discharge, no lesions              Cervix: no lesions               Bimanual Exam:  Uterus:  normal size, contour, position, consistency, mobility, non-tender              Adnexa: no mass, fullness, tenderness               Rectovaginal: Confirms               Anus:  normal sphincter tone, no lesions  Kaitlyn Sprague chaperoned for the exam.  A:  Well Woman with normal exam  Mild entry dyspareunia, currently controlled with lubrication  Vaginal atrophy  P:   No pap this year  Discussed breast self exam  Discussed calcium and vit D intake  Mammogram UTD  Colonoscopy in 2023 (per GI, she will confirm with them secondary to strong FH of polyps)  Labs with primary  She will continue to use a vaginal lubricant, call if she needs vaginal estrogen

## 2019-10-11 ENCOUNTER — Ambulatory Visit: Payer: 59 | Attending: Internal Medicine

## 2019-10-11 DIAGNOSIS — Z20822 Contact with and (suspected) exposure to covid-19: Secondary | ICD-10-CM

## 2019-10-12 LAB — NOVEL CORONAVIRUS, NAA: SARS-CoV-2, NAA: NOT DETECTED

## 2020-04-24 ENCOUNTER — Other Ambulatory Visit: Payer: Self-pay | Admitting: Registered Nurse

## 2020-04-24 DIAGNOSIS — I10 Essential (primary) hypertension: Secondary | ICD-10-CM

## 2020-04-24 NOTE — Telephone Encounter (Signed)
Called patient to schedule. MB full. No msg left

## 2020-04-24 NOTE — Telephone Encounter (Signed)
Rx sent and she will need to be seen for additional refills.  Please assist in getting her scheduled.

## 2020-05-17 ENCOUNTER — Other Ambulatory Visit: Payer: Self-pay

## 2020-05-17 ENCOUNTER — Ambulatory Visit: Payer: 59 | Admitting: Registered Nurse

## 2020-05-17 VITALS — BP 135/88 | HR 80 | Temp 98.4°F | Resp 15 | Ht 62.75 in | Wt 132.8 lb

## 2020-05-17 DIAGNOSIS — Z23 Encounter for immunization: Secondary | ICD-10-CM | POA: Diagnosis not present

## 2020-05-17 DIAGNOSIS — R1031 Right lower quadrant pain: Secondary | ICD-10-CM

## 2020-05-17 DIAGNOSIS — Z13220 Encounter for screening for lipoid disorders: Secondary | ICD-10-CM

## 2020-05-17 NOTE — Patient Instructions (Signed)
° ° ° °  If you have lab work done today you will be contacted with your lab results within the next 2 weeks.  If you have not heard from us then please contact us. The fastest way to get your results is to register for My Chart. ° ° °IF you received an x-ray today, you will receive an invoice from [facility]. Please contact [facility] at [phone] with questions or concerns regarding your invoice.  ° °IF you received labwork today, you will receive an invoice from [facility]. Please contact [facility] at [phone] with questions or concerns regarding your invoice.  ° °Our billing staff will not be able to assist you with questions regarding bills from these companies. ° °You will be contacted with the lab results as soon as they are available. The fastest way to get your results is to activate your My Chart account. Instructions are located on the last page of this paperwork. If you have not heard from us regarding the results in 2 weeks, please contact this office. °  ° ° ° °

## 2020-05-18 LAB — LIPID PANEL
Chol/HDL Ratio: 2.4 ratio (ref 0.0–4.4)
Cholesterol, Total: 185 mg/dL (ref 100–199)
HDL: 78 mg/dL (ref >39)
LDL Chol Calc (NIH): 88 mg/dL (ref 0–99)
Triglycerides: 109 mg/dL (ref 0–149)
VLDL Cholesterol Cal: 19 mg/dL (ref 5–40)

## 2020-05-18 LAB — BOWEL DISORDERS CASCADE

## 2020-05-18 LAB — CBC WITH DIFFERENTIAL
Hematocrit: 41.6 % (ref 34.0–46.6)
Lymphocytes Absolute: 1.2 10*3/uL (ref 0.7–3.1)

## 2020-05-21 LAB — HEMOGLOBIN A1C: Est. average glucose Bld gHb Est-mCnc: 111 mg/dL

## 2020-05-21 LAB — COMPREHENSIVE METABOLIC PANEL
Albumin/Globulin Ratio: 1.7 (ref 1.2–2.2)
Alkaline Phosphatase: 56 IU/L (ref 44–121)
Calcium: 9.6 mg/dL (ref 8.7–10.2)
Creatinine, Ser: 0.71 mg/dL (ref 0.57–1.00)
Glucose: 97 mg/dL (ref 65–99)
Total Protein: 7.1 g/dL (ref 6.0–8.5)

## 2020-05-21 LAB — CBC WITH DIFFERENTIAL
Eos: 2 %
RBC: 4.58 x10E6/uL (ref 3.77–5.28)

## 2020-05-22 LAB — CBC WITH DIFFERENTIAL
Basophils Absolute: 0 10*3/uL (ref 0.0–0.2)
MCV: 91 fL (ref 79–97)
Neutrophils Absolute: 3.2 10*3/uL (ref 1.4–7.0)
WBC: 4.9 10*3/uL (ref 3.4–10.8)

## 2020-05-22 LAB — HEMOGLOBIN A1C: Hgb A1c MFr Bld: 5.5 % (ref 4.8–5.6)

## 2020-05-22 NOTE — Progress Notes (Signed)
Acute Office Visit  Subjective:    Patient ID: Grace [name], female    DOB: [birth year], 54 y.o.   MRN: [id number]  Chief Complaint  Patient presents with  . Abdominal Pain    pt has been having right side abdominal pain for 6 months on an off no noted exacerbating/releiving factors   . Hip Pain    pt has been having Rt hip pain on an off for 6 months without any notes cause, pt notes no injury     HPI Patient is in today for abdominal pain  Has been going on for some time. Unfortunately has had a number of partial work ups without any clear answers, including imaging, GI specialists, multiple PCPs, and multiple labs. Has been suggested to keep a low FODMAP diet to help with this. Pt admits she is not perfect in following this but does not see relief when she does.  Pain is mostly right sided lower abdominal Did have a hx of hernia on that area as a child - it has been suggested that scar tissue may be contributory No symptoms of appendicitis, pain is not so severe and has been ongoing for so long with negative imaging.  Post menopausal - no GU symptoms - no flank pain or dysuria. Pain feels like it extends into hip, worse with ROM, comes and goes.   No other concerns at this time  Past Medical History:  Diagnosis Date  . Allergy   . Arthritis   . Hypertension     Past Surgical History:  Procedure Laterality Date  . DILATION AND CURETTAGE OF UTERUS  2005, 2006  . INGUINAL HERNIA REPAIR  1975   right    Family History  Problem Relation Age of Onset  . Colon polyps Mother   . Hypertension Mother   . Colon polyps Father   . Cancer Father        Prostate  . Hypertension Father   . Colon polyps Sister 48  . Colon polyps Brother 82  . Diabetes Paternal Grandmother   . Stroke Paternal Grandfather   . Hypertension Other        family hx  . Cancer Other        prostate  . Stomach cancer Neg Hx     Social History   Socioeconomic History  . Marital status:  Married    Spouse name: Not on file  . Number of children: Not on file  . Years of education: Not on file  . Highest education level: Not on file  Occupational History  . Not on file  Tobacco Use  . Smoking status: Former Smoker    Quit date: 01/21/1997    Years since quitting: 23.3  . Smokeless tobacco: Never Used  Vaping Use  . Vaping Use: Never used  Substance and Sexual Activity  . Alcohol use: Yes    Alcohol/week: 2.0 standard drinks    Types: 2 Glasses of wine per week  . Drug use: No  . Sexual activity: Yes    Partners: Male    Birth control/protection: Post-menopausal  Other Topics Concern  . Not on file  Social History Narrative  . Not on file   Social Determinants of Health   Financial Resource Strain: Low Risk   . Difficulty of Paying Living Expenses: Not hard at all  Food Insecurity: No Food Insecurity  . Worried About Charity fundraiser in the Last Year: Never true  . Ran Out  of Food in the Last Year: Never true  Transportation Needs: No Transportation Needs  . Lack of Transportation (Medical): No  . Lack of Transportation (Non-Medical): No  Physical Activity:   . Days of Exercise per Week: Not on file  . Minutes of Exercise per Session: Not on file  Stress:   . Feeling of Stress : Not on file  Social Connections:   . Frequency of Communication with Friends and Family: Not on file  . Frequency of Social Gatherings with Friends and Family: Not on file  . Attends Religious Services: Not on file  . Active Member of Clubs or Organizations: Not on file  . Attends Archivist Meetings: Not on file  . Marital Status: Not on file  Intimate Partner Violence:   . Fear of Current or Ex-Partner: Not on file  . Emotionally Abused: Not on file  . Physically Abused: Not on file  . Sexually Abused: Not on file    Outpatient Medications Prior to Visit  Medication Sig Dispense Refill  . hydrochlorothiazide (HYDRODIURIL) 25 MG tablet TAKE 1 TABLET(25 MG)  BY MOUTH DAILY 90 tablet 0  . cyclobenzaprine (FLEXERIL) 5 MG tablet Take 1 tablet (5 mg total) by mouth 3 (three) times daily as needed for muscle spasms. (Patient not taking: Reported on 07/24/2019) 30 tablet 1  . fluticasone (FLONASE) 50 MCG/ACT nasal spray Place 2 sprays into both nostrils daily. (Patient not taking: Reported on 05/17/2020) 16 g 6   No facility-administered medications prior to visit.    No Known Allergies  Review of Systems  Constitutional: Negative.   HENT: Negative.   Eyes: Negative.   Respiratory: Negative.   Cardiovascular: Negative.   Gastrointestinal: Positive for abdominal pain.  Genitourinary: Negative.   Musculoskeletal: Positive for arthralgias.  Skin: Negative.   Neurological: Negative.   Psychiatric/Behavioral: Negative.        Objective:    Physical Exam Vitals and nursing note reviewed.  Constitutional:      Appearance: She is well-developed.  Abdominal:     General: Bowel sounds are normal.     Palpations: Abdomen is soft. There is no shifting dullness or hepatomegaly.     Tenderness: There is abdominal tenderness in the right upper quadrant and right lower quadrant. There is no right CVA tenderness, left CVA tenderness, guarding or rebound. Negative signs include Murphy's sign, Rovsing's sign, McBurney's sign, psoas sign and obturator sign.     Hernia: No hernia is present.  Skin:    General: Skin is warm and dry.  Neurological:     General: No focal deficit present.     Mental Status: She is alert and oriented to person, place, and time.  Psychiatric:        Mood and Affect: Mood normal. Mood is not anxious or depressed.        Behavior: Behavior normal.     BP 135/88   Pulse 80   Temp 98.4 F (36.9 C) (Temporal)   Resp 15   Ht 5' 2.75" (1.594 m)   Wt 132 lb 12.8 oz (60.2 kg)   LMP 10/26/2015 (Approximate)   SpO2 98%   BMI 23.71 kg/m  Wt Readings from Last 3 Encounters:  05/17/20 132 lb 12.8 oz (60.2 kg)  07/24/19 127  lb 12.8 oz (58 kg)  06/05/19 130 lb (59 kg)    Health Maintenance Due  Topic Date Due  . COVID-19 Vaccine (2 - Pfizer 2-dose series) 04/27/2020    There are  no preventive care reminders to display for this patient.   Lab Results  Component Value Date   TSH 1.420 05/17/2020   Lab Results  Component Value Date   WBC 4.9 05/17/2020   HGB 13.7 05/17/2020   HCT 41.6 05/17/2020   MCV 91 05/17/2020   PLT 297 03/27/2019   Lab Results  Component Value Date   NA 142 05/17/2020   K 3.9 05/17/2020   CO2 26 05/17/2020   GLUCOSE 97 05/17/2020   BUN 12 05/17/2020   CREATININE 0.71 05/17/2020   BILITOT 0.3 05/17/2020   ALKPHOS 56 05/17/2020   AST 16 05/17/2020   ALT 13 05/17/2020   PROT 7.1 05/17/2020   ALBUMIN 4.5 05/17/2020   CALCIUM 9.6 05/17/2020   ANIONGAP 12 02/06/2018   GFR 81.56 12/11/2013   Lab Results  Component Value Date   CHOL 185 05/17/2020   Lab Results  Component Value Date   HDL 78 05/17/2020   Lab Results  Component Value Date   LDLCALC 88 05/17/2020   Lab Results  Component Value Date   TRIG 109 05/17/2020   Lab Results  Component Value Date   CHOLHDL 2.4 05/17/2020   Lab Results  Component Value Date   HGBA1C 5.5 05/17/2020       Assessment & Plan:   Problem List Items Addressed This Visit    None    Visit Diagnoses    Flu vaccine need    -  Primary   Relevant Orders   Flu Vaccine QUAD 6+ mos PF IM (Fluarix Quad PF) (Completed)   Right lower quadrant abdominal pain       Relevant Orders   CBC With Differential (Completed)   Comprehensive metabolic panel (Completed)   TSH (Completed)   Hemoglobin A1c (Completed)   Bowel Disorders Cascade (Completed)   CT Abdomen Pelvis Wo Contrast   Lipid screening       Relevant Orders   Lipid panel (Completed)       No orders of the defined types were placed in this encounter.  PLAN  Will send bowel disorders cascade and CBC, CMP, TSH, A1c for labs. Will follow up as  warranted  Will order Ct, as pain is acute on chronic, want to ensure there are no obvious anatomical concerns  Will plan based on these results - if no clear etiology will start to consider specialist follow up  Patient encouraged to call clinic with any questions, comments, or concerns.  I spent 26 minutes with this patient, more than 50% of which was spent counseling and/or educating.   Maximiano Coss, NP

## 2020-05-23 LAB — CBC WITH DIFFERENTIAL
Basos: 1 %
EOS (ABSOLUTE): 0.1 10*3/uL (ref 0.0–0.4)
Hemoglobin: 13.7 g/dL (ref 11.1–15.9)
Immature Grans (Abs): 0 10*3/uL (ref 0.0–0.1)
Immature Granulocytes: 0 %
Lymphs: 24 %
MCH: 29.9 pg (ref 26.6–33.0)
MCHC: 32.9 g/dL (ref 31.5–35.7)
Monocytes Absolute: 0.3 10*3/uL (ref 0.1–0.9)
Monocytes: 7 %
Neutrophils: 66 %
RDW: 12.8 % (ref 11.7–15.4)

## 2020-05-23 LAB — NOTE:

## 2020-05-23 LAB — NON CELIAC GLUTEN SENS SCREEN: Antigliadin IgG (native): 6 units (ref 0–19)

## 2020-05-23 LAB — INFLAMMATORY BOWEL DISEASE SCR
Atypical pANCA: NEGATIVE
Saccharomyces cerevisiae, IgG: <20 Units (ref 0.0–24.9)

## 2020-05-23 LAB — COMPREHENSIVE METABOLIC PANEL
ALT: 13 IU/L (ref 0–32)
AST: 16 IU/L (ref 0–40)
Albumin: 4.5 g/dL (ref 3.8–4.9)
BUN/Creatinine Ratio: 17 (ref 9–23)
BUN: 12 mg/dL (ref 6–24)
Bilirubin Total: 0.3 mg/dL (ref 0.0–1.2)
CO2: 26 mmol/L (ref 20–29)
Chloride: 103 mmol/L (ref 96–106)
GFR calc Af Amer: 112 mL/min/{1.73_m2} (ref >59)
GFR calc non Af Amer: 98 mL/min/{1.73_m2} (ref >59)
Globulin, Total: 2.6 g/dL (ref 1.5–4.5)
Potassium: 3.9 mmol/L (ref 3.5–5.2)
Sodium: 142 mmol/L (ref 134–144)

## 2020-05-23 LAB — TSH: TSH: 1.42 u[IU]/mL (ref 0.450–4.500)

## 2020-06-06 ENCOUNTER — Encounter: Payer: Self-pay | Admitting: Registered Nurse

## 2020-06-06 NOTE — Telephone Encounter (Signed)
Pt asking next steps given IBS suspicion please advise

## 2020-06-17 ENCOUNTER — Encounter: Payer: Self-pay | Admitting: Registered Nurse

## 2020-06-17 NOTE — Telephone Encounter (Signed)
Needs PA for CT scan

## 2020-06-19 ENCOUNTER — Emergency Department (HOSPITAL_COMMUNITY)
Admission: EM | Admit: 2020-06-19 | Discharge: 2020-06-19 | Disposition: A | Discharge status: Home / Self Care | Payer: 59 | Attending: Emergency Medicine | Admitting: Emergency Medicine

## 2020-06-19 ENCOUNTER — Emergency Department (HOSPITAL_COMMUNITY): Payer: 59

## 2020-06-19 ENCOUNTER — Other Ambulatory Visit: Payer: Self-pay

## 2020-06-19 DIAGNOSIS — Z87442 Personal history of urinary calculi: Secondary | ICD-10-CM | POA: Insufficient documentation

## 2020-06-19 DIAGNOSIS — Z87891 Personal history of nicotine dependence: Secondary | ICD-10-CM | POA: Insufficient documentation

## 2020-06-19 DIAGNOSIS — K219 Gastro-esophageal reflux disease without esophagitis: Secondary | ICD-10-CM | POA: Diagnosis not present

## 2020-06-19 DIAGNOSIS — M549 Dorsalgia, unspecified: Secondary | ICD-10-CM | POA: Insufficient documentation

## 2020-06-19 DIAGNOSIS — B029 Zoster without complications: Secondary | ICD-10-CM

## 2020-06-19 DIAGNOSIS — R21 Rash and other nonspecific skin eruption: Secondary | ICD-10-CM | POA: Diagnosis present

## 2020-06-19 DIAGNOSIS — R1030 Lower abdominal pain, unspecified: Secondary | ICD-10-CM | POA: Diagnosis not present

## 2020-06-19 DIAGNOSIS — Z79899 Other long term (current) drug therapy: Secondary | ICD-10-CM | POA: Diagnosis not present

## 2020-06-19 DIAGNOSIS — I10 Essential (primary) hypertension: Secondary | ICD-10-CM | POA: Diagnosis not present

## 2020-06-19 LAB — URINALYSIS, ROUTINE W REFLEX MICROSCOPIC
Bilirubin Urine: NEGATIVE
Glucose, UA: NEGATIVE mg/dL
Hgb urine dipstick: NEGATIVE
Ketones, ur: 20 mg/dL — AB
Leukocytes,Ua: NEGATIVE
Nitrite: NEGATIVE
Protein, ur: NEGATIVE mg/dL
Specific Gravity, Urine: 1.025 (ref 1.005–1.030)
pH: 5 (ref 5.0–8.0)

## 2020-06-19 MED ORDER — NAPROXEN 375 MG PO TABS: 375.0000 mg | ORAL_TABLET | Freq: Two times a day (BID) | ORAL | 0 refills | Status: DC

## 2020-06-19 MED ORDER — VALACYCLOVIR HCL 1 G PO TABS: 1000.0000 mg | ORAL_TABLET | Freq: Three times a day (TID) | ORAL | 0 refills | Status: DC

## 2020-06-19 MED ORDER — HYDROCODONE-ACETAMINOPHEN 5-325 MG PO TABS
1.0000 | ORAL_TABLET | Freq: Four times a day (QID) | ORAL | 0 refills | Status: DC | PRN
Start: 2020-06-19 — End: 2021-03-08

## 2020-06-19 NOTE — Discharge Instructions (Addendum)
Shingles is caused by varicella zoster virus (VZV), the same virus that causes chickenpox. After a person recovers from chickenpox, the virus stays dormant (inactive) in their body. The virus can reactivate later, causing shingles.  Most people who develop shingles have only one episode during their lifetime. However, you can have shingles more than once.  If you have shingles, direct contact with the fluid from your rash blisters can spread VZV to people who have never had chickenpox or never received the chickenpox vaccine. If they get infected, they will develop chickenpox, not shingles. They could then develop shingles later in life.  The risk of spreading VZV to others is low if you cover the shingles rash. People with shingles cannot spread the virus before their rash blisters appear or after the rash crusts.  People with chickenpox are more likely to spread VZV than people with shingles.

## 2020-06-19 NOTE — ED Triage Notes (Signed)
Pt arrived via POV, c/o right sided flank pain, radiating to back. States she has had this problem on and off for 2 years. Worsening in the last week. Denies any urinary issues, n/v or diarrhea. States pain is worse with mvmt or heavy lifting.

## 2020-06-19 NOTE — ED Provider Notes (Signed)
[facility] DEPT Provider Note   CSN: [id number] Arrival date & time: 06/19/20  7408     History Chief Complaint  Patient presents with  . Flank Pain    Grace [name] is a 54 y.o. female.  HPI   Patient presents to the ED for evaluation of right-sided flank and back pain.  Patient states she has been having this on and off for the last couple of years.  She has pain in her lower abdomen and back.  She also has a sensation of bloating.  Patient has not had any trouble with any nausea or vomiting or diarrhea.  She has not had any urinary symptoms.  She denies any fevers or chills.  Patient saw her primary care doctor.  She had laboratory tests and was told that everything was normal.  Her doctor ordered a CT scan.  That has been scheduled for December.  Patient did notice the symptoms worsen in the last several days.  Patient also started to noticed a rash on her back and her lower abdomen.  She had been using a heating pad so she thought the rash was related to that.  Patient symptoms have been worsening so she decided to come to the ED she did not feel like she could wait till December.  Past Medical History:  Diagnosis Date  . Allergy   . Arthritis   . Hypertension     Patient Active Problem List   Diagnosis Date Noted  . Viral URI with cough 08/02/2014  . Abdominal pain, other specified site 04/03/2014  . Right hip pain 10/03/2013  . DUB (dysfunctional uterine bleeding) 10/03/2013  . Varicose veins of left lower extremity with complications 14/48/1856  . Varicose veins left leg w/both ulcer heel and midfoot and inflammation 11/22/2012  . Dysplastic nevus of trunk 12/14/2011  . Asthma with allergic rhinitis 03/03/2011  . GERD 01/31/2009  . BREAST CYSTS, RIGHT 02/10/2008  . MIGRAINE VARIANT 12/06/2007  . RENAL CALCULUS, RIGHT 11/15/2007  . Essential hypertension 04/05/2007  . ALLERGIC RHINITIS 02/10/2007    Past Surgical History:    Procedure Laterality Date  . DILATION AND CURETTAGE OF UTERUS  2005, 2006  . INGUINAL HERNIA REPAIR  1975   right     OB History    Gravida  2   Para      Term      Preterm      AB  2   Living        SAB  2   TAB      Ectopic      Multiple      Live Births              Family History  Problem Relation Age of Onset  . Colon polyps Mother   . Hypertension Mother   . Colon polyps Father   . Cancer Father        Prostate  . Hypertension Father   . Colon polyps Sister 80  . Colon polyps Brother 26  . Diabetes Paternal Grandmother   . Stroke Paternal Grandfather   . Hypertension Other        family hx  . Cancer Other        prostate  . Stomach cancer Neg Hx     Social History   Tobacco Use  . Smoking status: Former Smoker    Quit date: 01/21/1997    Years since quitting: 23.4  . Smokeless tobacco:  Never Used  Vaping Use  . Vaping Use: Never used  Substance Use Topics  . Alcohol use: Yes    Alcohol/week: 2.0 standard drinks    Types: 2 Glasses of wine per week  . Drug use: No    Home Medications Prior to Admission medications   Medication Sig Start Date End Date Taking? Authorizing Provider  hydrochlorothiazide (HYDRODIURIL) 25 MG tablet TAKE 1 TABLET(25 MG) BY MOUTH DAILY 04/24/20   Maximiano Coss, NP  HYDROcodone-acetaminophen (NORCO/VICODIN) 5-325 MG tablet Take 1 tablet by mouth every 6 (six) hours as needed for severe pain. 06/19/20   Dorie Rank, MD  naproxen (NAPROSYN) 375 MG tablet Take 1 tablet (375 mg total) by mouth 2 (two) times daily. 06/19/20   Dorie Rank, MD  valACYclovir (VALTREX) 1000 MG tablet Take 1 tablet (1,000 mg total) by mouth 3 (three) times daily. 06/19/20   Dorie Rank, MD    Allergies    Patient has no known allergies.  Review of Systems   Review of Systems  All other systems reviewed and are negative.   Physical Exam Updated Vital Signs BP (!) 139/91   Pulse 73   Temp 98.5 F (36.9 C) (Oral)   Resp 19    Ht 1.575 m (5\' 2" )   LMP 10/26/2015 (Approximate)   SpO2 97%   BMI 24.29 kg/m   Physical Exam Vitals and nursing note reviewed.  Constitutional:      General: She is not in acute distress.    Appearance: She is well-developed.  HENT:     Head: Normocephalic and atraumatic.     Right Ear: External ear normal.     Left Ear: External ear normal.  Eyes:     General: No scleral icterus.       Right eye: No discharge.        Left eye: No discharge.     Conjunctiva/sclera: Conjunctivae normal.  Neck:     Trachea: No tracheal deviation.  Cardiovascular:     Rate and Rhythm: Normal rate and regular rhythm.  Pulmonary:     Effort: Pulmonary effort is normal. No respiratory distress.     Breath sounds: Normal breath sounds. No stridor. No wheezing or rales.  Abdominal:     General: Bowel sounds are normal. There is no distension.     Palpations: Abdomen is soft.     Tenderness: There is no abdominal tenderness. There is no guarding or rebound.  Musculoskeletal:        General: No tenderness.     Cervical back: Neck supple.  Skin:    General: Skin is warm and dry.     Coloration: Skin is not cyanotic.     Findings: Rash present. Rash is vesicular.     Comments: Erythematous rash in the right flank and abdomen vesicular lesions noted in the anterior aspect of the abdomen  Neurological:     Mental Status: She is alert.     Cranial Nerves: No cranial nerve deficit (no facial droop, extraocular movements intact, no slurred speech).     Sensory: No sensory deficit.     Motor: No abnormal muscle tone or seizure activity.     Coordination: Coordination normal.     ED Results / Procedures / Treatments   Labs (all labs ordered are listed, but only abnormal results are displayed) Labs Reviewed  URINALYSIS, ROUTINE W REFLEX MICROSCOPIC - Abnormal; Notable for the following components:      Result Value   Color, Urine  AMBER (*)    Ketones, ur 20 (*)    All other components within  normal limits    EKG None  Radiology CT Renal Stone Study  Result Date: 06/19/2020 CLINICAL DATA:  Chronic bilateral flank pain. EXAM: CT ABDOMEN AND PELVIS WITHOUT CONTRAST TECHNIQUE: Multidetector CT imaging of the abdomen and pelvis was performed following the standard protocol without IV contrast. COMPARISON:  February 06, 2018. FINDINGS: Lower chest: No acute abnormality. Hepatobiliary: No focal liver abnormality is seen. No gallstones, gallbladder wall thickening, or biliary dilatation. Pancreas: Unremarkable. No pancreatic ductal dilatation or surrounding inflammatory changes. Spleen: Normal in size without focal abnormality. Adrenals/Urinary Tract: Adrenal glands appear normal. Severe atrophy of right kidney is noted. No hydronephrosis or renal obstruction is noted. No renal or ureteral calculi are noted. Urinary bladder is decompressed. Stomach/Bowel: Stomach is within normal limits. Appendix appears normal. No evidence of bowel wall thickening, distention, or inflammatory changes. Vascular/Lymphatic: No significant vascular findings are present. No enlarged abdominal or pelvic lymph nodes. Reproductive: Uterus and bilateral adnexa are unremarkable. Other: No abdominal wall hernia or abnormality. No abdominopelvic ascites. Musculoskeletal: No acute or significant osseous findings. IMPRESSION: Severe atrophy of right kidney. No hydronephrosis or renal obstruction is noted. No renal or ureteral calculi are noted. Electronically Signed   By: Marijo Conception M.D.   On: 06/19/2020 10:26    Procedures Procedures (including critical care time)  Medications Ordered in ED Medications - No data to display  ED Course  I have reviewed the triage vital signs and the nursing notes.  Pertinent labs & imaging results that were available during my care of the patient were reviewed by me and considered in my medical decision making (see chart for details).  Clinical Course as of Jun 19 1100  Wed Jun 19, 2020  0958 Ua negative    [JK]    Clinical Course User Index [JK] Dorie Rank, MD   MDM Rules/Calculators/A&P                         Patient's rash is consistent with shingles.  I suspect this is because of her flank and abdominal pain.  Patient however has been having persistent pain off and on greater than for the last few days.  Shingles would not account for those symptoms.  Patient is still interested in getting a CT scan.  We will proceed with CT renal study.  Labs reviewed from patient's office visit on October 22.  She did have a normal CBC metabolic panel and TSH.  We will add on a urinalysis  Urinalysis unremarkable.  CT scan does show evidence of atrophic right kidney but no other acute findings.  Patient symptoms are related to shingles.  We will give her prescription for Val acyclovir as well as hydrocodone and Naprosyn.  Discussed infection control measures.  Final Clinical Impression(s) / ED Diagnoses Final diagnoses:  Herpes zoster without complication    Rx / DC Orders ED Discharge Orders         Ordered    valACYclovir (VALTREX) 1000 MG tablet  3 times daily        06/19/20 1003    HYDROcodone-acetaminophen (NORCO/VICODIN) 5-325 MG tablet  Every 6 hours PRN        06/19/20 1100    naproxen (NAPROSYN) 375 MG tablet  2 times daily        06/19/20 1100  Dorie Rank, MD 06/19/20 1101

## 2020-06-19 NOTE — ED Notes (Signed)
Pt has red blistery rash noted to right flank around to right lower quadrant and expresses that this is where her pain is.

## 2020-06-19 NOTE — ED Notes (Signed)
Pt to CT via stretcher

## 2020-06-19 NOTE — ED Notes (Signed)
Back to room from CT via stretcher.

## 2020-07-02 ENCOUNTER — Ambulatory Visit: Payer: 59

## 2020-07-05 ENCOUNTER — Encounter: Payer: 59 | Admitting: Registered Nurse

## 2020-07-09 ENCOUNTER — Other Ambulatory Visit: Payer: 59

## 2020-07-30 NOTE — Progress Notes (Deleted)
55 y.o. G65P0020 Married White or Caucasian Not Hispanic or Latino female here for annual exam.      Patient's last menstrual period was 10/26/2015 (approximate).          Sexually active: {yes no:314532}  The current method of family planning is post menopausal status.    Exercising: {yes no:314532}  {types:19826} Smoker:  No, former smoker   Health Maintenance: Pap:  06-27-18 negative, HR HPV negative            04-18-15 negative, HR HPV negative  History of abnormal Pap:  yes MMG:  ***07-10-2019 density D/BIRADS 1 negative  BMD:   n/a Colonoscopy: 03-04-12 WNL, both parents with pre-cancerous polyps. Colonoscopy due in 2023 (per GI) TDaP:  12-04-11 Gardasil: n/a   reports that she quit smoking about 23 years ago. She has never used smokeless tobacco. She reports current alcohol use of about 2.0 standard drinks of alcohol per week. She reports that she does not use drugs.  Past Medical History:  Diagnosis Date  . Allergy   . Arthritis   . Hypertension     Past Surgical History:  Procedure Laterality Date  . DILATION AND CURETTAGE OF UTERUS  2005, 2006  . INGUINAL HERNIA REPAIR  1975   right    Current Outpatient Medications  Medication Sig Dispense Refill  . hydrochlorothiazide (HYDRODIURIL) 25 MG tablet TAKE 1 TABLET(25 MG) BY MOUTH DAILY 90 tablet 0  . HYDROcodone-acetaminophen (NORCO/VICODIN) 5-325 MG tablet Take 1 tablet by mouth every 6 (six) hours as needed for severe pain. 12 tablet 0  . naproxen (NAPROSYN) 375 MG tablet Take 1 tablet (375 mg total) by mouth 2 (two) times daily. 20 tablet 0  . valACYclovir (VALTREX) 1000 MG tablet Take 1 tablet (1,000 mg total) by mouth 3 (three) times daily. 21 tablet 0   No current facility-administered medications for this visit.    Family History  Problem Relation Age of Onset  . Colon polyps Mother   . Hypertension Mother   . Colon polyps Father   . Cancer Father        Prostate  . Hypertension Father   . Colon polyps  Sister 26  . Colon polyps Brother 41  . Diabetes Paternal Grandmother   . Stroke Paternal Grandfather   . Hypertension Other        family hx  . Cancer Other        prostate  . Stomach cancer Neg Hx     Review of Systems  Exam:   LMP 10/26/2015 (Approximate)   Weight change: @WEIGHTCHANGE @ Height:      Ht Readings from Last 3 Encounters:  06/19/20 5\' 2"  (1.575 m)  05/17/20 5' 2.75" (1.594 m)  07/24/19 5' 2.75" (1.594 m)    General appearance: alert, cooperative and appears stated age Head: Normocephalic, without obvious abnormality, atraumatic Neck: no adenopathy, supple, symmetrical, trachea midline and thyroid {CHL AMB PHY EX THYROID NORM DEFAULT:[phone]::"normal to inspection and palpation"} Lungs: clear to auscultation bilaterally Cardiovascular: regular rate and rhythm Breasts: {Exam; breast:13139::"normal appearance, no masses or tenderness"} Abdomen: soft, non-tender; non distended,  no masses,  no organomegaly Extremities: extremities normal, atraumatic, no cyanosis or edema Skin: Skin color, texture, turgor normal. No rashes or lesions Lymph nodes: Cervical, supraclavicular, and axillary nodes normal. No abnormal inguinal nodes palpated Neurologic: Grossly normal   Pelvic: External genitalia:  no lesions              Urethra:  normal appearing urethra with  no masses, tenderness or lesions              Bartholins and Skenes: normal                 Vagina: normal appearing vagina with normal color and discharge, no lesions              Cervix: {CHL AMB PHY EX CERVIX NORM DEFAULT:[phone]::"no lesions"}               Bimanual Exam:  Uterus:  {CHL AMB PHY EX UTERUS NORM [phone]::"normal size, contour, position, consistency, mobility, non-tender"}              Adnexa: {CHL AMB PHY EX ADNEXA NO MASS DEFAULT:[phone]::"no mass, fullness, tenderness"}               Rectovaginal: Confirms               Anus:  normal sphincter tone, no lesions  ***  chaperoned for the exam.  A:  Well Woman with normal exam  P:

## 2020-08-01 ENCOUNTER — Ambulatory Visit: Payer: 59 | Admitting: Obstetrics and Gynecology

## 2020-08-28 ENCOUNTER — Other Ambulatory Visit: Payer: Self-pay | Admitting: Obstetrics and Gynecology

## 2020-08-28 DIAGNOSIS — Z1231 Encounter for screening mammogram for malignant neoplasm of breast: Secondary | ICD-10-CM

## 2020-10-14 ENCOUNTER — Other Ambulatory Visit: Payer: Self-pay

## 2020-10-14 ENCOUNTER — Ambulatory Visit
Admission: RE | Admit: 2020-10-14 | Discharge: 2020-10-14 | Disposition: A | Discharge status: Home / Self Care | Payer: 59 | Source: Ambulatory Visit | Attending: Obstetrics and Gynecology | Admitting: Obstetrics and Gynecology

## 2020-10-14 DIAGNOSIS — Z1231 Encounter for screening mammogram for malignant neoplasm of breast: Secondary | ICD-10-CM

## 2020-11-26 ENCOUNTER — Other Ambulatory Visit: Payer: Self-pay | Admitting: Registered Nurse

## 2020-11-26 DIAGNOSIS — I10 Essential (primary) hypertension: Secondary | ICD-10-CM

## 2021-01-06 ENCOUNTER — Other Ambulatory Visit: Payer: Self-pay | Admitting: Registered Nurse

## 2021-01-06 DIAGNOSIS — I10 Essential (primary) hypertension: Secondary | ICD-10-CM

## 2021-02-13 ENCOUNTER — Other Ambulatory Visit: Payer: Self-pay | Admitting: Registered Nurse

## 2021-02-13 DIAGNOSIS — I10 Essential (primary) hypertension: Secondary | ICD-10-CM

## 2021-03-07 ENCOUNTER — Emergency Department (HOSPITAL_BASED_OUTPATIENT_CLINIC_OR_DEPARTMENT_OTHER)
Admission: EM | Admit: 2021-03-07 | Discharge: 2021-03-08 | Disposition: A | Discharge status: Home / Self Care | Payer: 59 | Attending: Emergency Medicine | Admitting: Emergency Medicine

## 2021-03-07 ENCOUNTER — Other Ambulatory Visit: Payer: Self-pay

## 2021-03-07 DIAGNOSIS — Z87891 Personal history of nicotine dependence: Secondary | ICD-10-CM | POA: Diagnosis not present

## 2021-03-07 DIAGNOSIS — N200 Calculus of kidney: Secondary | ICD-10-CM | POA: Insufficient documentation

## 2021-03-07 DIAGNOSIS — I10 Essential (primary) hypertension: Secondary | ICD-10-CM | POA: Insufficient documentation

## 2021-03-07 DIAGNOSIS — R109 Unspecified abdominal pain: Secondary | ICD-10-CM | POA: Diagnosis present

## 2021-03-07 LAB — COMPREHENSIVE METABOLIC PANEL
ALT: 13 U/L (ref 0–44)
AST: 18 U/L (ref 15–41)
Albumin: 4.6 g/dL (ref 3.5–5.0)
Alkaline Phosphatase: 34 U/L — ABNORMAL LOW (ref 38–126)
Anion gap: 16 — ABNORMAL HIGH (ref 5–15)
BUN: 25 mg/dL — ABNORMAL HIGH (ref 6–20)
CO2: 25 mmol/L (ref 22–32)
Calcium: 9.6 mg/dL (ref 8.9–10.3)
Chloride: 94 mmol/L — ABNORMAL LOW (ref 98–111)
Creatinine, Ser: 0.79 mg/dL (ref 0.44–1.00)
GFR, Estimated: >60 mL/min (ref >60)
Glucose, Bld: 137 mg/dL — ABNORMAL HIGH (ref 70–99)
Potassium: 3.1 mmol/L — ABNORMAL LOW (ref 3.5–5.1)
Sodium: 135 mmol/L (ref 135–145)
Total Bilirubin: 0.6 mg/dL (ref 0.3–1.2)
Total Protein: 7.5 g/dL (ref 6.5–8.1)

## 2021-03-07 LAB — CBC WITH DIFFERENTIAL/PLATELET
Abs Immature Granulocytes: 0.03 10*3/uL (ref 0.00–0.07)
Basophils Absolute: 0 10*3/uL (ref 0.0–0.1)
Basophils Relative: 0 %
Eosinophils Absolute: 0.1 10*3/uL (ref 0.0–0.5)
Eosinophils Relative: 1 %
HCT: 40.6 % (ref 36.0–46.0)
Hemoglobin: 13.8 g/dL (ref 12.0–15.0)
Immature Granulocytes: 0 %
Lymphocytes Relative: 22 %
Lymphs Abs: 1.6 10*3/uL (ref 0.7–4.0)
MCH: 30.4 pg (ref 26.0–34.0)
MCHC: 34 g/dL (ref 30.0–36.0)
MCV: 89.4 fL (ref 80.0–100.0)
Monocytes Absolute: 0.5 10*3/uL (ref 0.1–1.0)
Monocytes Relative: 7 %
Neutro Abs: 4.8 10*3/uL (ref 1.7–7.7)
Neutrophils Relative %: 70 %
Platelets: 309 10*3/uL (ref 150–400)
RBC: 4.54 MIL/uL (ref 3.87–5.11)
RDW: 12.8 % (ref 11.5–15.5)
WBC: 7.1 10*3/uL (ref 4.0–10.5)
nRBC: 0 % (ref 0.0–0.2)

## 2021-03-07 LAB — URINALYSIS, ROUTINE W REFLEX MICROSCOPIC
Bilirubin Urine: NEGATIVE
Glucose, UA: NEGATIVE mg/dL
Hgb urine dipstick: NEGATIVE
Ketones, ur: >80 mg/dL — AB
Nitrite: NEGATIVE
Specific Gravity, Urine: 1.022 (ref 1.005–1.030)
WBC, UA: >50 WBC/hpf — ABNORMAL HIGH (ref 0–5)
pH: 5 (ref 5.0–8.0)

## 2021-03-07 MED ORDER — MORPHINE SULFATE (PF) 4 MG/ML IV SOLN
4.0000 mg | Freq: Once | INTRAVENOUS | Status: AC
  Administered 2021-03-08: 4 mg via INTRAVENOUS
  Filled 2021-03-07: qty 1

## 2021-03-07 MED ORDER — KETOROLAC TROMETHAMINE 15 MG/ML IJ SOLN
15.0000 mg | Freq: Once | INTRAMUSCULAR | Status: AC
  Administered 2021-03-08: 15 mg via INTRAVENOUS
  Filled 2021-03-07: qty 1

## 2021-03-07 MED ORDER — FENTANYL CITRATE (PF) 100 MCG/2ML IJ SOLN
50.0000 ug | Freq: Once | INTRAMUSCULAR | Status: AC
  Administered 2021-03-07: 50 ug via INTRAVENOUS
  Filled 2021-03-07: qty 2

## 2021-03-07 MED ORDER — ONDANSETRON HCL 4 MG/2ML IJ SOLN
4.0000 mg | Freq: Once | INTRAMUSCULAR | Status: AC
  Administered 2021-03-07: 4 mg via INTRAVENOUS
  Filled 2021-03-07: qty 2

## 2021-03-07 MED ORDER — ONDANSETRON HCL 4 MG/2ML IJ SOLN
4.0000 mg | Freq: Once | INTRAMUSCULAR | Status: AC
  Administered 2021-03-08: 4 mg via INTRAVENOUS
  Filled 2021-03-07: qty 2

## 2021-03-07 NOTE — ED Triage Notes (Signed)
Pt to ED from home with c/o right sided flank pain and N/V since 2000 this evening. Pt has hx of renal calculi and states this feels like one. Pt unable to sit in chair due to pain.

## 2021-03-08 ENCOUNTER — Telehealth (HOSPITAL_BASED_OUTPATIENT_CLINIC_OR_DEPARTMENT_OTHER): Payer: Self-pay | Admitting: Emergency Medicine

## 2021-03-08 ENCOUNTER — Emergency Department (HOSPITAL_BASED_OUTPATIENT_CLINIC_OR_DEPARTMENT_OTHER): Payer: 59

## 2021-03-08 MED ORDER — TAMSULOSIN HCL 0.4 MG PO CAPS: 0.4000 mg | ORAL_CAPSULE | Freq: Every day | ORAL | 0 refills | Status: DC

## 2021-03-08 MED ORDER — HYDROCODONE-ACETAMINOPHEN 5-325 MG PO TABS
1.0000 | ORAL_TABLET | Freq: Three times a day (TID) | ORAL | 0 refills | Status: AC | PRN
Start: 2021-03-08 — End: 2021-03-11

## 2021-03-08 MED ORDER — OXYCODONE HCL 5 MG PO TABS
5.0000 mg | ORAL_TABLET | ORAL | 0 refills | Status: DC | PRN
Start: 2021-03-08 — End: 2021-03-08

## 2021-03-08 NOTE — ED Provider Notes (Signed)
[facility] Emergency Department Provider Note MRN:  [id number]  Arrival date & time: 03/08/21     Chief Complaint   Flank Pain   History of Present Illness   Grace [name] is a 55 y.o. year-old female with a history of hypertension presenting to the ED with chief complaint of flank pain.  Location: Right flank Duration: 3 hours Onset: Sudden Timing: Constant Description: Dull ache Severity: Severe Exacerbating/Alleviating Factors: None, cannot get comfortable Associated Symptoms: Nausea Pertinent Negatives: Denies fever, no dysuria, no hematuria, no abdominal pain, no chest pain   Review of Systems  A complete 10 system review of systems was obtained and all systems are negative except as noted in the HPI and PMH.   Patient's Health History    Past Medical History:  Diagnosis Date   Allergy    Arthritis    Hypertension     Past Surgical History:  Procedure Laterality Date   DILATION AND CURETTAGE OF UTERUS  2005, 2006   INGUINAL HERNIA REPAIR  1975   right    Family History  Problem Relation Age of Onset   Colon polyps Mother    Hypertension Mother    Colon polyps Father    Cancer Father        Prostate   Hypertension Father    Colon polyps Sister 43   Colon polyps Brother 42   Diabetes Paternal Grandmother    Stroke Paternal Grandfather    Hypertension Other        family hx   Cancer Other        prostate   Stomach cancer Neg Hx     Social History   Socioeconomic History   Marital status: Married    Spouse name: Not on file   Number of children: Not on file   Years of education: Not on file   Highest education level: Not on file  Occupational History   Not on file  Tobacco Use   Smoking status: Former    Types: Cigarettes    Quit date: 01/21/1997    Years since quitting: 24.1   Smokeless tobacco: Never  Vaping Use   Vaping Use: Never used  Substance and Sexual Activity   Alcohol use: Yes    Alcohol/week: 2.0  standard drinks    Types: 2 Glasses of wine per week   Drug use: No   Sexual activity: Yes    Partners: Male    Birth control/protection: Post-menopausal  Other Topics Concern   Not on file  Social History Narrative   Not on file   Social Determinants of Health   Financial Resource Strain: Not on file  Food Insecurity: Not on file  Transportation Needs: Not on file  Physical Activity: Not on file  Stress: Not on file  Social Connections: Not on file  Intimate Partner Violence: Not on file     Physical Exam   Vitals:   03/08/21 0100 03/08/21 0115  BP: 101/68 104/66  Pulse: 64 65  Resp:  18  Temp:    SpO2: 98% 95%    CONSTITUTIONAL: Well-appearing, NAD NEURO:  Alert and oriented x 3, no focal deficits EYES:  eyes equal and reactive ENT/NECK:  no LAD, no JVD CARDIO: Regular rate, well-perfused, normal S1 and S2 PULM:  CTAB no wheezing or rhonchi GI/GU:  normal bowel sounds, non-distended, non-tender MSK/SPINE:  No gross deformities, no edema SKIN:  no rash, atraumatic PSYCH:  Appropriate speech and behavior  *Additional and/or  pertinent findings included in MDM below  Diagnostic and Interventional Summary    EKG Interpretation  Date/Time:    Ventricular Rate:    PR Interval:    QRS Duration:   QT Interval:    QTC Calculation:   R Axis:     Text Interpretation:         Labs Reviewed  COMPREHENSIVE METABOLIC PANEL - Abnormal; Notable for the following components:      Result Value   Potassium 3.1 (*)    Chloride 94 (*)    Glucose, Bld 137 (*)    BUN 25 (*)    Alkaline Phosphatase 34 (*)    Anion gap 16 (*)    All other components within normal limits  URINALYSIS, ROUTINE W REFLEX MICROSCOPIC - Abnormal; Notable for the following components:   Ketones, ur >80 (*)    Protein, ur TRACE (*)    Leukocytes,Ua MODERATE (*)    WBC, UA >50 (*)    All other components within normal limits  URINE CULTURE  CBC WITH DIFFERENTIAL/PLATELET    CT Renal Stone  Study  Final Result      Medications  fentaNYL (SUBLIMAZE) injection 50 mcg (50 mcg Intravenous Given 03/07/21 2306)  ondansetron (ZOFRAN) injection 4 mg (4 mg Intravenous Given 03/07/21 2306)  ketorolac (TORADOL) 15 MG/ML injection 15 mg (15 mg Intravenous Given 03/08/21 0000)  morphine 4 MG/ML injection 4 mg (4 mg Intravenous Given 03/08/21 0000)  ondansetron (ZOFRAN) injection 4 mg (4 mg Intravenous Given 03/08/21 0000)     Procedures  /  Critical Care Procedures  ED Course and Medical Decision Making  I have reviewed the triage vital signs, the nursing notes, and pertinent available records from the EMR.  Listed above are laboratory and imaging tests that I personally ordered, reviewed, and interpreted and then considered in my medical decision making (see below for details).  Considering stone versus right-sided diverticulitis versus cholecystitis.  CT pending.     Patient is much more comfortable after medications listed above.  CT confirms kidney stone.  No AKI.  Urinalysis with large white blood cells, but no bacteria, nitrate negative.  Patient is without any fever, has not been sick lately, no leukocytosis, doubt concomitant UTI.  Sent for culture, strict return precautions for fever.  Appropriate for discharge.  Barth Kirks. Sedonia Small, MD [place] [email or web address]  Final Clinical Impressions(s) / ED Diagnoses     ICD-10-CM   1. Kidney stone  N20.0       ED Discharge Orders          Ordered    oxyCODONE (ROXICODONE) 5 MG immediate release tablet  Every 4 hours PRN        03/08/21 0159    tamsulosin (FLOMAX) 0.4 MG CAPS capsule  Daily        03/08/21 0159             Discharge Instructions Discussed with and Provided to Patient:    Discharge Instructions      You were evaluated in the Emergency Department and after careful evaluation, we did not find any emergent condition requiring admission or further testing  in the hospital.  Your exam/testing today was overall reassuring.  Symptoms seem to be due to a kidney stone.  Take the Flomax daily to help pass the stone.  Recommend Tylenol 1000 mg every 4-6 hours and/or Motrin 600 mg every 4-6 hours for pain.  You can use the  oxycodone medication for more significant pain.  Please return to the Emergency Department if you experience any worsening of your condition or fever.  Thank you for allowing [place] to be a part of your care.        Maudie Flakes, MD 03/08/21 0201

## 2021-03-08 NOTE — Telephone Encounter (Signed)
Re-prescribing meds as Pharmacy is closed.

## 2021-03-08 NOTE — Discharge Instructions (Addendum)
You were evaluated in the Emergency Department and after careful evaluation, we did not find any emergent condition requiring admission or further testing in the hospital.  Your exam/testing today was overall reassuring.  Symptoms seem to be due to a kidney stone.  Take the Flomax daily to help pass the stone.  Recommend Tylenol 1000 mg every 4-6 hours and/or Motrin 600 mg every 4-6 hours for pain.  You can use the oxycodone medication for more significant pain.  Please return to the Emergency Department if you experience any worsening of your condition or fever.  Thank you for allowing [place] to be a part of your care.

## 2021-03-10 LAB — URINE CULTURE

## 2021-03-14 ENCOUNTER — Other Ambulatory Visit: Payer: Self-pay | Admitting: Registered Nurse

## 2021-03-14 DIAGNOSIS — I10 Essential (primary) hypertension: Secondary | ICD-10-CM

## 2021-04-14 ENCOUNTER — Telehealth: Payer: Self-pay | Admitting: *Deleted

## 2021-04-14 NOTE — Telephone Encounter (Signed)
Pt called to report leg pain. She has been our pt in the past and had laser vein ablation in 2018. Her legs hurt intermittently and she is unable to determine aggravating or relieving factors. I have made her a vein appt.

## 2021-04-17 ENCOUNTER — Other Ambulatory Visit: Payer: Self-pay

## 2021-04-17 DIAGNOSIS — I83892 Varicose veins of left lower extremities with other complications: Secondary | ICD-10-CM

## 2021-04-18 ENCOUNTER — Ambulatory Visit (HOSPITAL_COMMUNITY)
Admission: RE | Admit: 2021-04-18 | Discharge: 2021-04-18 | Disposition: A | Discharge status: Home / Self Care | Payer: 59 | Source: Ambulatory Visit | Attending: Vascular Surgery | Admitting: Vascular Surgery

## 2021-04-18 ENCOUNTER — Other Ambulatory Visit: Payer: Self-pay

## 2021-04-18 DIAGNOSIS — I83892 Varicose veins of left lower extremities with other complications: Secondary | ICD-10-CM | POA: Diagnosis not present

## 2021-04-28 NOTE — Telephone Encounter (Signed)
Re-prescribing meds as pharmacy is closed.

## 2021-04-29 ENCOUNTER — Encounter: Payer: Self-pay | Admitting: Physician Assistant

## 2021-04-29 ENCOUNTER — Other Ambulatory Visit: Payer: Self-pay

## 2021-04-29 ENCOUNTER — Ambulatory Visit: Payer: 59 | Admitting: Physician Assistant

## 2021-04-29 VITALS — BP 118/80 | HR 78 | Temp 98.4°F | Resp 20 | Ht 62.0 in | Wt 118.4 lb

## 2021-04-29 DIAGNOSIS — I83892 Varicose veins of left lower extremities with other complications: Secondary | ICD-10-CM | POA: Diagnosis not present

## 2021-04-29 NOTE — Progress Notes (Signed)
Office Note     CC:  follow up Requesting Provider:  Maximiano Coss, NP  HPI: Grace [name] is a 55 y.o. (1965-08-10) female who presents for evaluation of left lower extremity pain.  She previously had left greater saphenous vein ablation as well as left anterior accessory saphenous vein ablation by Dr. Kellie Simmering and Dr. Donnetta Hutching.  She is having occasional pain and stretching feeling in an area along the path of the previously ablated greater saphenous vein.  She wears compression and elevates her leg.  She denies any diagnosis of DVT or venous ulcerations since she was last seen.  She also denies claudication, rest pain, or nonhealing wounds of bilateral lower extremities.   Past Medical History:  Diagnosis Date   Allergy    Arthritis    Hypertension     Past Surgical History:  Procedure Laterality Date   DILATION AND CURETTAGE OF UTERUS  2005, 2006   [place]   right    Social History   Socioeconomic History   Marital status: Married    Spouse name: Not on file   Number of children: Not on file   Years of education: Not on file   Highest education level: Not on file  Occupational History   Not on file  Tobacco Use   Smoking status: Former    Types: Cigarettes    Quit date: 01/21/1997    Years since quitting: 24.2   Smokeless tobacco: Never  Vaping Use   Vaping Use: Never used  Substance and Sexual Activity   Alcohol use: Yes    Alcohol/week: 2.0 standard drinks    Types: 2 Glasses of wine per week   Drug use: No   Sexual activity: Yes    Partners: Male    Birth control/protection: Post-menopausal  Other Topics Concern   Not on file  Social History Narrative   Not on file   Social Determinants of Health   Financial Resource Strain: Not on file  Food Insecurity: Not on file  Transportation Needs: Not on file  Physical Activity: Not on file  Stress: Not on file  Social Connections: Not on file  Intimate Partner Violence: Not on file     Family History  Problem Relation Age of Onset   Colon polyps Mother    Hypertension Mother    Colon polyps Father    Cancer Father        Prostate   Hypertension Father    Colon polyps Sister 45   Colon polyps Brother 9   Diabetes Paternal Grandmother    Stroke Paternal Grandfather    Hypertension Other        family hx   Cancer Other        prostate   Stomach cancer Neg Hx     Current Outpatient Medications  Medication Sig Dispense Refill   hydrochlorothiazide (HYDRODIURIL) 25 MG tablet TAKE 1 TABLET(25 MG) BY MOUTH DAILY 90 tablet 0   naproxen (NAPROSYN) 375 MG tablet Take 1 tablet (375 mg total) by mouth 2 (two) times daily. (Patient not taking: Reported on 04/29/2021) 20 tablet 0   tamsulosin (FLOMAX) 0.4 MG CAPS capsule Take 1 capsule (0.4 mg total) by mouth daily. (Patient not taking: Reported on 04/29/2021) 10 capsule 0   valACYclovir (VALTREX) 1000 MG tablet Take 1 tablet (1,000 mg total) by mouth 3 (three) times daily. (Patient not taking: Reported on 04/29/2021) 21 tablet 0   No current facility-administered medications for this visit.  No Known Allergies   REVIEW OF SYSTEMS:   [X]  denotes positive finding, [ ]  denotes negative finding Cardiac  Comments:  Chest pain or chest pressure:    Shortness of breath upon exertion:    Short of breath when lying flat:    Irregular heart rhythm:        Vascular    Pain in calf, thigh, or hip brought on by ambulation:    Pain in feet at night that wakes you up from your sleep:     Blood clot in your veins:    Leg swelling:         Pulmonary    Oxygen at home:    Productive cough:     Wheezing:         Neurologic    Sudden weakness in arms or legs:     Sudden numbness in arms or legs:     Sudden onset of difficulty speaking or slurred speech:    Temporary loss of vision in one eye:     Problems with dizziness:         Gastrointestinal    Blood in stool:     Vomited blood:         Genitourinary     Burning when urinating:     Blood in urine:        Psychiatric    Major depression:         Hematologic    Bleeding problems:    Problems with blood clotting too easily:        Skin    Rashes or ulcers:        Constitutional    Fever or chills:      PHYSICAL EXAMINATION:  Vitals:   04/29/21 1434  BP: 118/80  Pulse: 78  Resp: 20  Temp: 98.4 F (36.9 C)  TempSrc: Temporal  SpO2: 97%  Weight: 118 lb 6.4 oz (53.7 kg)  Height: 5\' 2"  (1.575 m)    General:  WDWN in NAD; vital signs documented above Gait: Not observed HENT: WNL, normocephalic Pulmonary: normal non-labored breathing , without Rales, rhonchi,  wheezing Cardiac: regular HR Abdomen: soft, NT, no masses Skin: without rashes Vascular Exam/Pulses:  Right Left  Radial 2+ (normal) 2+ (normal)  DP 2+ (normal) 2+ (normal)   Extremities: without ischemic changes, without Gangrene , without cellulitis; without open wounds; mid medial thigh pinpoint tenderness in area of spider veins Musculoskeletal: no muscle wasting or atrophy  Neurologic: A&O X 3;  No focal weakness or paresthesias are detected Psychiatric:  The pt has Normal affect.   Non-Invasive Vascular Imaging:   Left lower extremity venous reflux study negative for DVT Popliteal vein reflux Greater saphenous vein and anterior accessory saphenous vein not visualized due to prior ablation    ASSESSMENT/PLAN:: 55 y.o. female here for evaluation of left leg pain in an area on the path of previously ablated saphenous vein  -Left lower extremity venous reflux study demonstrates successful ablation of the greater saphenous vein and anterior accessory saphenous plain.  She does have some deep reflux in the popliteal vein however this appears mild.  Unclear etiology of pinpoint tenderness in the medial left thigh however recommended warm compress and topical anti-inflammatory to help alleviate symptoms.  Patient will follow up with her PCP if symptoms persist  or fail to improve.  Patient can otherwise follow-up on an as-needed basis   Dagoberto Ligas, PA-C [facility] [phone]  Clinic MD:  Carlis Abbott

## 2021-06-12 ENCOUNTER — Other Ambulatory Visit: Payer: Self-pay | Admitting: Registered Nurse

## 2021-06-12 DIAGNOSIS — I10 Essential (primary) hypertension: Secondary | ICD-10-CM

## 2021-09-22 ENCOUNTER — Other Ambulatory Visit: Payer: Self-pay | Admitting: Obstetrics and Gynecology

## 2021-09-22 DIAGNOSIS — Z1231 Encounter for screening mammogram for malignant neoplasm of breast: Secondary | ICD-10-CM

## 2021-10-15 ENCOUNTER — Ambulatory Visit
Admission: RE | Admit: 2021-10-15 | Discharge: 2021-10-15 | Disposition: A | Discharge status: Home / Self Care | Payer: 59 | Source: Ambulatory Visit | Attending: Obstetrics and Gynecology | Admitting: Obstetrics and Gynecology

## 2021-10-15 ENCOUNTER — Other Ambulatory Visit: Payer: Self-pay

## 2021-10-15 DIAGNOSIS — Z1231 Encounter for screening mammogram for malignant neoplasm of breast: Secondary | ICD-10-CM

## 2021-10-23 ENCOUNTER — Telehealth (HOSPITAL_COMMUNITY): Payer: Self-pay

## 2021-10-23 ENCOUNTER — Emergency Department (HOSPITAL_COMMUNITY): Payer: 59

## 2021-10-23 ENCOUNTER — Other Ambulatory Visit: Payer: Self-pay

## 2021-10-23 ENCOUNTER — Emergency Department (HOSPITAL_COMMUNITY)
Admission: EM | Admit: 2021-10-23 | Discharge: 2021-10-23 | Disposition: A | Discharge status: Home / Self Care | Payer: 59 | Attending: Emergency Medicine | Admitting: Emergency Medicine

## 2021-10-23 DIAGNOSIS — I440 Atrioventricular block, first degree: Secondary | ICD-10-CM | POA: Diagnosis not present

## 2021-10-23 DIAGNOSIS — Z79899 Other long term (current) drug therapy: Secondary | ICD-10-CM | POA: Insufficient documentation

## 2021-10-23 DIAGNOSIS — I10 Essential (primary) hypertension: Secondary | ICD-10-CM | POA: Insufficient documentation

## 2021-10-23 DIAGNOSIS — R002 Palpitations: Secondary | ICD-10-CM | POA: Diagnosis present

## 2021-10-23 LAB — CBC WITH DIFFERENTIAL/PLATELET
Abs Immature Granulocytes: 0.01 10*3/uL (ref 0.00–0.07)
Basophils Absolute: 0 10*3/uL (ref 0.0–0.1)
Basophils Relative: 1 %
Eosinophils Absolute: 0.1 10*3/uL (ref 0.0–0.5)
Eosinophils Relative: 2 %
HCT: 41.3 % (ref 36.0–46.0)
Hemoglobin: 14 g/dL (ref 12.0–15.0)
Immature Granulocytes: 0 %
Lymphocytes Relative: 29 %
Lymphs Abs: 1.2 10*3/uL (ref 0.7–4.0)
MCH: 31.3 pg (ref 26.0–34.0)
MCHC: 33.9 g/dL (ref 30.0–36.0)
MCV: 92.2 fL (ref 80.0–100.0)
Monocytes Absolute: 0.3 10*3/uL (ref 0.1–1.0)
Monocytes Relative: 9 %
Neutro Abs: 2.4 10*3/uL (ref 1.7–7.7)
Neutrophils Relative %: 59 %
Platelets: 243 10*3/uL (ref 150–400)
RBC: 4.48 MIL/uL (ref 3.87–5.11)
RDW: 13.2 % (ref 11.5–15.5)
WBC: 4 10*3/uL (ref 4.0–10.5)
nRBC: 0 % (ref 0.0–0.2)

## 2021-10-23 LAB — BASIC METABOLIC PANEL
Anion gap: 7 (ref 5–15)
BUN: 19 mg/dL (ref 6–20)
CO2: 28 mmol/L (ref 22–32)
Calcium: 9.7 mg/dL (ref 8.9–10.3)
Chloride: 104 mmol/L (ref 98–111)
Creatinine, Ser: 0.68 mg/dL (ref 0.44–1.00)
GFR, Estimated: >60 mL/min (ref >60)
Glucose, Bld: 103 mg/dL — ABNORMAL HIGH (ref 70–99)
Potassium: 3.7 mmol/L (ref 3.5–5.1)
Sodium: 139 mmol/L (ref 135–145)

## 2021-10-23 LAB — TROPONIN I (HIGH SENSITIVITY)
Troponin I (High Sensitivity): 3 ng/L (ref <18)
Troponin I (High Sensitivity): 3 ng/L (ref <18)

## 2021-10-23 MED ORDER — POTASSIUM CHLORIDE CRYS ER 20 MEQ PO TBCR: 40.0000 meq | EXTENDED_RELEASE_TABLET | Freq: Once | ORAL | Status: DC

## 2021-10-23 MED ORDER — SODIUM CHLORIDE 0.9 % IV BOLUS
1000.0000 mL | Freq: Once | INTRAVENOUS | Status: AC
  Administered 2021-10-23: 1000 mL via INTRAVENOUS

## 2021-10-23 NOTE — ED Notes (Signed)
X-ray at bedside

## 2021-10-23 NOTE — ED Triage Notes (Signed)
Pt reports two days of hypotension, dizziness, and feeling like she might pass out. Endorses "fluttering" in her chest.  ?

## 2021-10-23 NOTE — ED Provider Notes (Signed)
?[facility] ?Provider Note ? ? ?CSN: [id number] ?Arrival date & time: 10/23/21  0901 ? ?  ? ?History ? ?Chief Complaint  ?Patient presents with  ? Hypotension  ? ? ?Grace [name] is a 56 y.o. female. ? ?Patient is a 56 year old female with no significant past medical history presenting for palpitations.  Patient admits to episode of dizziness described as lightheadedness over the past 2 to 3 days with exertion and at rest.  States last night she was sitting on her couch resting when she began to feel palpitations denies any chest pain or shortness of breath.  Denies any history of PE, DVT, prolonged immobilization, recent surgeries in the last 4 weeks, or hormone therapy use.  No previous MI.  No vomiting or diarrhea.  No black or bloody stools.  Symptoms are resolved at this time. ? ?The history is provided by the patient. No language interpreter was used.  ? ?  ? ?Home Medications ?Prior to Admission medications   ?Medication Sig Start Date End Date Taking? Authorizing Provider  ?hydrochlorothiazide (HYDRODIURIL) 25 MG tablet TAKE 1 TABLET(25 MG) BY MOUTH DAILY ?Patient taking differently: Take 25 mg by mouth daily. 06/12/21  Yes Maximiano Coss, NP  ?ibuprofen (ADVIL) 200 MG tablet Take 400 mg by mouth every 6 (six) hours as needed for headache.   Yes [provider]  ?Probiotic Product (PROBIOTIC PEARLS PO) Take 1 capsule by mouth daily.   Yes [provider]  ?naproxen (NAPROSYN) 375 MG tablet Take 1 tablet (375 mg total) by mouth 2 (two) times daily. ?Patient not taking: Reported on 04/29/2021 06/19/20   Dorie Rank, MD  ?tamsulosin (FLOMAX) 0.4 MG CAPS capsule Take 1 capsule (0.4 mg total) by mouth daily. ?Patient not taking: Reported on 04/29/2021 03/08/21   Varney Biles, MD  ?valACYclovir (VALTREX) 1000 MG tablet Take 1 tablet (1,000 mg total) by mouth 3 (three) times daily. ?Patient not taking: Reported on 04/29/2021 06/19/20   Dorie Rank, MD  ?    ? ?Allergies    ?Patient has no known allergies.   ? ?Review of Systems   ?Review of Systems  ?Constitutional:  Negative for chills and fever.  ?HENT:  Negative for ear pain and sore throat.   ?Eyes:  Negative for pain and visual disturbance.  ?Respiratory:  Negative for cough and shortness of breath.   ?Cardiovascular:  Positive for palpitations. Negative for chest pain.  ?Gastrointestinal:  Negative for abdominal pain and vomiting.  ?Genitourinary:  Negative for dysuria and hematuria.  ?Musculoskeletal:  Negative for arthralgias and back pain.  ?Skin:  Negative for color change and rash.  ?Neurological:  Positive for dizziness and light-headedness. Negative for seizures and syncope.  ?All other systems reviewed and are negative. ? ?Physical Exam ?Updated Vital Signs ?BP 106/71   Pulse 67   Temp 98.6 ?F (37 ?C)   Resp 15   LMP 10/26/2015 (Approximate)   SpO2 99%  ?Physical Exam ?Vitals and nursing note reviewed.  ?Constitutional:   ?   General: She is not in acute distress. ?   Appearance: She is well-developed.  ?HENT:  ?   Head: Normocephalic and atraumatic.  ?Eyes:  ?   Conjunctiva/sclera: Conjunctivae normal.  ?Cardiovascular:  ?   Rate and Rhythm: Normal rate and regular rhythm.  ?   Heart sounds: No murmur heard. ?Pulmonary:  ?   Effort: Pulmonary effort is normal. No respiratory distress.  ?   Breath sounds: Normal breath sounds.  ?  Abdominal:  ?   Palpations: Abdomen is soft.  ?   Tenderness: There is no abdominal tenderness.  ?Musculoskeletal:     ?   General: No swelling.  ?   Cervical back: Neck supple.  ?Skin: ?   General: Skin is warm and dry.  ?   Capillary Refill: Capillary refill takes less than 2 seconds.  ?Neurological:  ?   Mental Status: She is alert and oriented to person, place, and time.  ?   GCS: GCS eye subscore is 4. GCS verbal subscore is 5. GCS motor subscore is 6.  ?Psychiatric:     ?   Mood and Affect: Mood normal.  ? ? ?ED Results / Procedures / Treatments   ?Labs ?(all labs  ordered are listed, but only abnormal results are displayed) ?Labs Reviewed  ?BASIC METABOLIC PANEL - Abnormal; Notable for the following components:  ?    Result Value  ? Glucose, Bld 103 (*)   ? All other components within normal limits  ?CBC WITH DIFFERENTIAL/PLATELET  ?TROPONIN I (HIGH SENSITIVITY)  ?TROPONIN I (HIGH SENSITIVITY)  ? ? ?EKG ?EKG Interpretation ? ?Date/Time:  Thursday October 23 2021 09:17:16 EDT ?Ventricular Rate:  76 ?PR Interval:  202 ?QRS Duration: 81 ?QT Interval:  345 ?QTC Calculation: 388 ?R Axis:   72 ?Text Interpretation: Sinus rhythm Multiple premature complexes, vent & supraven Borderline prolonged PR interval Low voltage, precordial leads Probable anteroseptal infarct, old Confirmed by Campbell Stall ([id number]) on 1/95/0932 10:38:44 AM ? ?Radiology ?DG Chest Portable 1 View ? ?Result Date: 10/23/2021 ?CLINICAL DATA:  Palpitations EXAM: PORTABLE CHEST 1 VIEW COMPARISON:  Chest x-ray 01/30/2009 FINDINGS: Heart size and mediastinal contours are within normal limits. No suspicious pulmonary opacities identified. No pleural effusion or pneumothorax visualized. No acute osseous abnormality appreciated. Dextroscoliosis of the thoracic spine. IMPRESSION: No acute intrathoracic process identified. Electronically Signed   By: Ofilia Neas M.D.   On: 10/23/2021 11:19   ? ?Procedures ?Procedures  ? ? ?Medications Ordered in ED ?Medications  ?sodium chloride 0.9 % bolus 1,000 mL (0 mLs Intravenous Stopped 10/23/21 1417)  ? ? ?ED Course/ Medical Decision Making/ A&P ?  ?                        ?Medical Decision Making ?Amount and/or Complexity of Data Reviewed ?Labs: ordered. ?Radiology: ordered. ? ? ?45:66 AM ?56 year old female with no significant past medical history presenting for palpitations.  Exam patient is resting in bed comfortably, alert and oriented x3, no acute distress, afebrile, stable vital signs.  Physical exam demonstrates equal bilateral breath sounds with no adventitious lung sounds.   ECG demonstrates sinus rhythm prolonged PR interval concerning for first-degree heart block.  ST segment elevation or depression.  Troponin within normal limits.  Stable electrolytes.  Chest x-ray demonstrates no acute process.   ? ?PERC criteria negative.  Low suspicion pulmonary embolism.  No reported shortness of breath.  No hypoxia or abnormal oxygenation during today's visits. ? ?Patient in no distress and no symptoms at this time.  Patient recommended for close follow-up with cardiology for further work-up including loop monitor.  Detailed discussions were had with the patient regarding current findings, and need for close f/u with cardiology. The patient has been instructed to return immediately if the symptoms worsen in any way for re-evaluation. Patient verbalized understanding and is in agreement with current care plan. All questions answered prior to discharge. ? ? ? ? ? ? ? ? ?  Final Clinical Impression(s) / ED Diagnoses ?Final diagnoses:  ?Prolonged P-R interval  ?First degree heart block  ? ? ?Rx / DC Orders ?ED Discharge Orders   ? ? None  ? ?  ? ? ?  ?Lianne Cure, DO ?96/43/83 1130 ? ?

## 2021-10-27 ENCOUNTER — Encounter: Payer: Self-pay | Admitting: Registered Nurse

## 2021-10-27 ENCOUNTER — Ambulatory Visit (INDEPENDENT_AMBULATORY_CARE_PROVIDER_SITE_OTHER): Payer: 59 | Admitting: Registered Nurse

## 2021-10-27 VITALS — BP 110/78 | HR 65 | Temp 98.1°F | Resp 16 | Ht 62.5 in | Wt 119.6 lb

## 2021-10-27 DIAGNOSIS — Z8639 Personal history of other endocrine, nutritional and metabolic disease: Secondary | ICD-10-CM

## 2021-10-27 DIAGNOSIS — R140 Abdominal distension (gaseous): Secondary | ICD-10-CM | POA: Insufficient documentation

## 2021-10-27 DIAGNOSIS — Z09 Encounter for follow-up examination after completed treatment for conditions other than malignant neoplasm: Secondary | ICD-10-CM

## 2021-10-27 DIAGNOSIS — I10 Essential (primary) hypertension: Secondary | ICD-10-CM

## 2021-10-27 DIAGNOSIS — R1031 Right lower quadrant pain: Secondary | ICD-10-CM | POA: Insufficient documentation

## 2021-10-27 DIAGNOSIS — Z0000 Encounter for general adult medical examination without abnormal findings: Secondary | ICD-10-CM | POA: Diagnosis not present

## 2021-10-27 DIAGNOSIS — Z8371 Family history of colonic polyps: Secondary | ICD-10-CM | POA: Insufficient documentation

## 2021-10-27 LAB — CBC WITH DIFFERENTIAL/PLATELET
Basophils Absolute: 0 10*3/uL (ref 0.0–0.1)
Basophils Relative: 0.7 % (ref 0.0–3.0)
Eosinophils Absolute: 0.1 10*3/uL (ref 0.0–0.7)
Eosinophils Relative: 3.2 % (ref 0.0–5.0)
HCT: 40.6 % (ref 36.0–46.0)
Hemoglobin: 13.4 g/dL (ref 12.0–15.0)
Lymphocytes Relative: 28.7 % (ref 12.0–46.0)
Lymphs Abs: 1 10*3/uL (ref 0.7–4.0)
MCHC: 33 g/dL (ref 30.0–36.0)
MCV: 92.2 fl (ref 78.0–100.0)
Monocytes Absolute: 0.3 10*3/uL (ref 0.1–1.0)
Monocytes Relative: 8.5 % (ref 3.0–12.0)
Neutro Abs: 2.1 10*3/uL (ref 1.4–7.7)
Neutrophils Relative %: 58.9 % (ref 43.0–77.0)
Platelets: 262 10*3/uL (ref 150.0–400.0)
RBC: 4.41 Mil/uL (ref 3.87–5.11)
RDW: 14.1 % (ref 11.5–15.5)
WBC: 3.6 10*3/uL — ABNORMAL LOW (ref 4.0–10.5)

## 2021-10-27 LAB — LIPID PANEL
Cholesterol: 179 mg/dL (ref 0–200)
HDL: 74.5 mg/dL (ref >39.00)
LDL Cholesterol: 91 mg/dL (ref 0–99)
NonHDL: 104.24
Total CHOL/HDL Ratio: 2
Triglycerides: 65 mg/dL (ref 0.0–149.0)
VLDL: 13 mg/dL (ref 0.0–40.0)

## 2021-10-27 LAB — URINALYSIS, ROUTINE W REFLEX MICROSCOPIC
Bilirubin Urine: NEGATIVE
Hgb urine dipstick: NEGATIVE
Ketones, ur: NEGATIVE
Leukocytes,Ua: NEGATIVE
Nitrite: NEGATIVE
RBC / HPF: NONE SEEN (ref 0–?)
Specific Gravity, Urine: <=1.005 — AB (ref 1.000–1.030)
Total Protein, Urine: NEGATIVE
Urine Glucose: NEGATIVE
Urobilinogen, UA: 0.2 (ref 0.0–1.0)
WBC, UA: NONE SEEN (ref 0–?)
pH: 7.5 (ref 5.0–8.0)

## 2021-10-27 LAB — COMPREHENSIVE METABOLIC PANEL
ALT: 11 U/L (ref 0–35)
AST: 17 U/L (ref 0–37)
Albumin: 4.5 g/dL (ref 3.5–5.2)
Alkaline Phosphatase: 33 U/L — ABNORMAL LOW (ref 39–117)
BUN: 20 mg/dL (ref 6–23)
CO2: 29 mEq/L (ref 19–32)
Calcium: 9.6 mg/dL (ref 8.4–10.5)
Chloride: 103 mEq/L (ref 96–112)
Creatinine, Ser: 0.7 mg/dL (ref 0.40–1.20)
GFR: 97.43 mL/min (ref >60.00)
Glucose, Bld: 94 mg/dL (ref 70–99)
Potassium: 3.8 mEq/L (ref 3.5–5.1)
Sodium: 139 mEq/L (ref 135–145)
Total Bilirubin: 0.7 mg/dL (ref 0.2–1.2)
Total Protein: 6.8 g/dL (ref 6.0–8.3)

## 2021-10-27 LAB — TSH: TSH: 2.04 u[IU]/mL (ref 0.35–5.50)

## 2021-10-27 LAB — HEMOGLOBIN A1C: Hgb A1c MFr Bld: 5.7 % (ref 4.6–6.5)

## 2021-10-27 NOTE — Patient Instructions (Signed)
Grace [name] - ? ?Always great to see you! ? ?Call with any concerns ? ?Exam is normal. Labs back today ? ?See you in a year for a physical, sooner if concerns arise ? ?Thanks, ? ?Rich  ?

## 2021-10-27 NOTE — Progress Notes (Signed)
? ?Complete physical exam ? ?Patient: Grace [name]   DOB: 1965-09-26   56 y.o. Female  MRN: [id number] ?Visit Date: 10/27/2021 ? ?Subjective:  ?  ?Chief Complaint  ?Patient presents with  ? Annual Exam  ?  Also wanted Rich to go over her blood work that she had at the ER on Thursday of last week  ? ? ?Grace [name] is a 56 y.o. female who presents today for a complete physical exam. She reports consuming a general diet. Home exercise routine includes  . She generally feels fairly well. She reports sleeping well. She does have additional problems to discuss today.  ? ?Vision:Within the last year ?Dental:Within Last 6 months ?STD Screen:No ? ?ER follow up  ?Presented last week with palpitations.  ?Noted to have prolonged PR interval, first degree block ?Has been referred to cardiology. ?CBC, troponins, and BMP obtained at that time, notable for glucose 103. Otherwise unremarkable. ? ?Will obtain labs today.  ? ?Most recent fall risk assessment: ? ?  05/17/2020  ?  3:32 PM  ?Fall Risk   ?Falls in the past year? 0  ?Number falls in past yr: 0  ?Injury with Fall? 0  ?Risk for fall due to : No Fall Risks  ?Follow up Falls evaluation completed  ? ?  ?Most recent depression screenings: ? ?  05/17/2020  ?  3:32 PM 06/09/2019  ? 10:07 AM  ?PHQ 2/9 Scores  ?PHQ - 2 Score 0 0  ? ? ? ? ?Patient Care Team: ?Maximiano Coss, NP as PCP - General (Adult Health Nurse Practitioner)  ? ?Medications: ?Outpatient Medications Prior to Visit  ?Medication Sig  ? hydrochlorothiazide (HYDRODIURIL) 25 MG tablet TAKE 1 TABLET(25 MG) BY MOUTH DAILY (Patient taking differently: Take 25 mg by mouth daily.)  ? ibuprofen (ADVIL) 200 MG tablet Take 400 mg by mouth every 6 (six) hours as needed for headache.  ? Probiotic Product (PROBIOTIC PEARLS PO) Take 1 capsule by mouth daily.  ? naproxen (NAPROSYN) 375 MG tablet Take 1 tablet (375 mg total) by mouth 2 (two) times daily. (Patient not taking: Reported on 04/29/2021)  ? tamsulosin (FLOMAX) 0.4 MG CAPS  capsule Take 1 capsule (0.4 mg total) by mouth daily. (Patient not taking: Reported on 04/29/2021)  ? valACYclovir (VALTREX) 1000 MG tablet Take 1 tablet (1,000 mg total) by mouth 3 (three) times daily. (Patient not taking: Reported on 04/29/2021)  ? ?No facility-administered medications prior to visit.  ? ? ?Review of Systems  ?Constitutional: Negative.   ?HENT: Negative.    ?Eyes: Negative.   ?Respiratory: Negative.    ?Cardiovascular: Negative.   ?Gastrointestinal: Negative.   ?Genitourinary: Negative.   ?Musculoskeletal: Negative.   ?Skin: Negative.   ?Neurological: Negative.   ?Psychiatric/Behavioral: Negative.    ?All other systems reviewed and are negative. ? ? ?   ?Objective:  ? ?  ?BP 110/78   Pulse 65   Temp 98.1 ?F (36.7 ?C)   Resp 16   Ht 5' 2.5" (1.588 m)   Wt 119 lb 9.6 oz (54.3 kg)   LMP 10/26/2015 (Approximate)   SpO2 99%   BMI 21.53 kg/m?  ? ? ? ?Physical Exam ?Vitals and nursing note reviewed.  ?Constitutional:   ?   General: She is not in acute distress. ?   Appearance: Normal appearance. She is normal weight. She is not ill-appearing, toxic-appearing or diaphoretic.  ?HENT:  ?   Head: Normocephalic and atraumatic.  ?   Right Ear: Tympanic membrane,  ear canal and external ear normal. There is no impacted cerumen.  ?   Left Ear: Tympanic membrane, ear canal and external ear normal. There is no impacted cerumen.  ?   Nose: Nose normal. No congestion or rhinorrhea.  ?   Mouth/Throat:  ?   Mouth: Mucous membranes are moist.  ?   Pharynx: Oropharynx is clear. No oropharyngeal exudate or posterior oropharyngeal erythema.  ?Eyes:  ?   General: No scleral icterus.    ?   Right eye: No discharge.     ?   Left eye: No discharge.  ?   Extraocular Movements: Extraocular movements intact.  ?   Conjunctiva/sclera: Conjunctivae normal.  ?   Pupils: Pupils are equal, round, and reactive to light.  ?Neck:  ?   Vascular: No carotid bruit.  ?Cardiovascular:  ?   Rate and Rhythm: Normal rate and regular rhythm.   ?   Pulses: Normal pulses.  ?   Heart sounds: Normal heart sounds. No murmur heard. ?  No friction rub. No gallop.  ?Pulmonary:  ?   Effort: Pulmonary effort is normal. No respiratory distress.  ?   Breath sounds: Normal breath sounds. No stridor. No wheezing, rhonchi or rales.  ?Chest:  ?   Chest wall: No tenderness.  ?Abdominal:  ?   General: Abdomen is flat. Bowel sounds are normal. There is no distension.  ?   Palpations: There is no mass.  ?   Tenderness: There is no abdominal tenderness. There is no right CVA tenderness, left CVA tenderness, guarding or rebound.  ?   Hernia: No hernia is present.  ?Musculoskeletal:     ?   General: No swelling, tenderness, deformity or signs of injury. Normal range of motion.  ?   Cervical back: Normal range of motion and neck supple. No rigidity or tenderness.  ?   Right lower leg: No edema.  ?   Left lower leg: No edema.  ?Lymphadenopathy:  ?   Cervical: No cervical adenopathy.  ?Skin: ?   General: Skin is warm and dry.  ?   Capillary Refill: Capillary refill takes less than 2 seconds.  ?   Coloration: Skin is not jaundiced or pale.  ?   Findings: No bruising, erythema, lesion or rash.  ?Neurological:  ?   General: No focal deficit present.  ?   Mental Status: She is alert and oriented to person, place, and time. Mental status is at baseline.  ?   Cranial Nerves: No cranial nerve deficit.  ?   Sensory: No sensory deficit.  ?   Motor: No weakness.  ?   Coordination: Coordination normal.  ?   Gait: Gait normal.  ?   Deep Tendon Reflexes: Reflexes normal.  ?Psychiatric:     ?   Mood and Affect: Mood normal.     ?   Behavior: Behavior normal.     ?   Thought Content: Thought content normal.     ?   Judgment: Judgment normal.  ?  ? ?No results found for any visits on 10/27/21. ?   ?Assessment & Plan:  ?  ?Routine Health Maintenance and Physical Exam ? ?Immunization History  ?Administered Date(s) Administered  ? Influenza Whole 04/17/2008, 05/10/2009, 05/13/2010  ?  Influenza,inj,Quad PF,6+ Mos 04/03/2014, 03/27/2019, 05/17/2020  ? PFIZER(Purple Top)SARS-COV-2 Vaccination 04/06/2020  ? Td 07/27/2000  ? Tdap 12/04/2011  ? ? ?Health Maintenance  ?Topic Date Due  ? Zoster Vaccines- Shingrix (1 of 2) Never  done  ? COVID-19 Vaccine (2 - Pfizer risk series) 04/27/2020  ? PAP SMEAR-Modifier  06/27/2021  ? TETANUS/TDAP  12/03/2021  ? INFLUENZA VACCINE  02/24/2022  ? COLONOSCOPY (Pts 45-8yr Insurance coverage will need to be confirmed)  03/04/2022  ? MAMMOGRAM  10/16/2023  ? Hepatitis C Screening  Completed  ? HIV Screening  Completed  ? HPV VACCINES  Aged Out  ? ? ?Discussed health benefits of physical activity, and encouraged her to engage in regular exercise appropriate for her age and condition. ? ?Problem List Items Addressed This Visit   ?None ?Visit Diagnoses   ? ? Annual physical exam    -  Primary  ? Hospital discharge follow-up      ? Primary hypertension      ? Relevant Orders  ? CBC with Differential/Platelet  ? Comprehensive metabolic panel  ? Hemoglobin A1c  ? Lipid panel  ? TSH  ? Urinalysis, Routine w reflex microscopic  ? History of elevated glucose      ? Relevant Orders  ? Hemoglobin A1c  ? Lipid panel  ? ?  ? ?Return in about 1 year (around 10/28/2022) for CPE and labs. ? ?  ? ?PLAN ?Exam unremarkable ?Labs collected. Will follow up with the patient as warranted. ?Follow up with cardiology as scheduled ?Patient encouraged to call clinic with any questions, comments, or concerns. ? ?RMaximiano Coss NP ?

## 2021-10-29 NOTE — Telephone Encounter (Signed)
Called patient and discussed lab results

## 2021-10-29 NOTE — Telephone Encounter (Signed)
Please call pt back at [phone]  ?

## 2021-10-29 NOTE — Telephone Encounter (Signed)
Attempted to call patient to discuss lab results. Pt voicemail is currently full ?

## 2021-10-30 ENCOUNTER — Telehealth: Payer: Self-pay | Admitting: Registered Nurse

## 2021-10-30 NOTE — Telephone Encounter (Signed)
Chief Complaint Blood Pressure Low ?Reason for Call Symptomatic / Request for Health Information ?Initial Comment Caller states that she is dizzy and has low blood ?pressure. ?Additional Comment She added that her heart is fluttering. ?Translation No ?Nurse Assessment ?Nurse: Rolin Barry, RN, Levada Dy Date/Time Eilene Ghazi Time): 10/23/2021 8:37:51 AM ?Confirm and document reason for call. If ?symptomatic, describe symptoms. ?---Caller advised that he blood pressure is low and ?she is feeling dizzy. Caller has been on a diuretic for a ?while. ?Does the patient have any new or worsening ?symptoms? ---Yes ?Will a triage be completed? ---Yes ?Related visit to physician within the last 2 weeks? ---No ?Does the PT have any chronic conditions? (i.e. ?diabetes, asthma, this includes High risk factors for ?pregnancy, etc.) ?---Yes ?List chronic conditions. ---HTN ?Is this a behavioral health or substance abuse call? ---No ?Guidelines ?Guideline Title Affirmed Question Affirmed Notes Nurse Date/Time (Eastern ?Time) ?Blood Pressure - Low [4] Systolic BP < ?90 AND [1] dizzy, ?lightheaded, or weak ?Rolin Barry, RN, Levada Dy 10/23/2021 8:39:25 ?AM ?Disp. Time (Eastern ?Time) Disposition Final User ?10/23/2021 8:42:31 AM 911 Outcome Documentation Deaton, RN, Levada Dy ?PLEASE NOTE: All timestamps contained within this report are represented as [place] Standard Time. ?CONFIDENTIALTY NOTICE: This fax transmission is intended only for the addressee. It contains information that is legally privileged, confidential or ?otherwise protected from use or disclosure. If you are not the intended recipient, you are strictly prohibited from reviewing, disclosing, copying using ?or disseminating any of this information or taking any action in reliance on or regarding this information. If you have received this fax in error, please ?notify us immediately by telephone so that we can arrange for its return to [place]. Phone: [phone], Toll-Free: [phone], Fax:  [phone] ?Page: 2 of 2 ?Call Id: [id number] ?Disp. Time (Eastern ?Time) Disposition Final User ?Reason: Caller refused to call 911, ?Husband is driving her to the ED ?NOW by POV. ?10/23/2021 8:41:18 AM Call EMS 911 Now Yes Deaton, RN, Levada Dy ?Caller Disagree/Comply Disagree ?Caller Understands Yes ?PreDisposition Did not know what to do ?Care Advice Given Per Guideline ?CALL EMS 911 NOW: * Immediate medical attention is needed. You need to hang up and call 911 (or an ambulance). * Triager ?Discretion: I'll call you back in a few minutes to be sure you were able to reach them. CARE ADVICE given per Low Blood Pressure ?(Adult) guideline. ?Referrals ?[facility] - ED ?

## 2021-10-31 ENCOUNTER — Encounter: Payer: Self-pay | Admitting: Cardiology

## 2021-10-31 ENCOUNTER — Ambulatory Visit: Payer: 59 | Admitting: Cardiology

## 2021-10-31 ENCOUNTER — Ambulatory Visit (INDEPENDENT_AMBULATORY_CARE_PROVIDER_SITE_OTHER): Payer: 59

## 2021-10-31 VITALS — BP 116/80 | HR 72 | Ht 62.5 in | Wt 114.0 lb

## 2021-10-31 DIAGNOSIS — R42 Dizziness and giddiness: Secondary | ICD-10-CM

## 2021-10-31 DIAGNOSIS — I10 Essential (primary) hypertension: Secondary | ICD-10-CM | POA: Diagnosis not present

## 2021-10-31 DIAGNOSIS — R9431 Abnormal electrocardiogram [ECG] [EKG]: Secondary | ICD-10-CM

## 2021-10-31 DIAGNOSIS — R002 Palpitations: Secondary | ICD-10-CM

## 2021-10-31 NOTE — Patient Instructions (Signed)
Medication Instructions:  ?Your physician recommends that you continue on your current medications as directed. Please refer to the Current Medication list given to you today. ? ?*If you need a refill on your cardiac medications before your next appointment, please call your pharmacy* ? ? ?Lab Work: ?BMET, [place] today ? ?If you have labs (blood work) drawn today and your tests are completely normal, you will receive your results only by: ?MyChart Message (if you have MyChart) OR ?A paper copy in the mail ?If you have any lab test that is abnormal or we need to change your treatment, we will call you to review the results. ? ? ?Testing/Procedures: ?Your physician has requested that you have an echocardiogram. Echocardiography is a painless test that uses sound waves to create images of your heart. It provides your doctor with information about the size and shape of your heart and how well your heart?s chambers and valves are working. This procedure takes approximately one hour. There are no restrictions for this procedure. ?This will be done at our [facility] location:  [address] ? ?ZIO XT- Long Term Monitor Instructions  ? ?Your physician has requested you wear a ZIO patch monitor for _7__ days.  ?This is a single patch monitor.   [employer] supplies one patch monitor per enrollment. Additional stickers are not available. Please do not apply patch if you will be having a Nuclear Stress Test, Echocardiogram, Cardiac CT, MRI, or Chest Xray during the period you would be wearing the monitor. The patch cannot be worn during these tests. You cannot remove and re-apply the ZIO XT patch monitor.  ?Your ZIO patch monitor will be sent Fed Ex from [employer] directly to your home address. It may take 3-5 days to receive your monitor after you have been enrolled.  ?Once you have received your monitor, please review the enclosed instructions. Your monitor has already been registered assigning a  specific monitor serial # to you. ? ?Billing and Patient Assistance Program Information  ? ?We have supplied [employer] with any of your insurance information on file for billing purposes. ?[employer] offers a sliding scale Patient Assistance Program for patients that do not have insurance, or whose insurance does not completely cover the cost of the ZIO monitor.   You must apply for the Patient Assistance Program to qualify for this discounted rate.     To apply, please call [employer] at [phone], select option 4, then select option 2, and ask to apply for Patient Assistance Program.  Theodore Demark will ask your household income, and how many people are in your household.  They will quote your out-of-pocket cost based on that information.  [employer] will also be able to set up a 9-month interest-free payment plan if needed. ? ?Applying the monitor  ? ?Shave hair from upper left chest.  ?Hold abrader disc by orange tab. Rub abrader in 40 strokes over the upper left chest as indicated in your monitor instructions.  ?Clean area with 4 enclosed alcohol pads. Let dry.  ?Apply patch as indicated in monitor instructions. Patch will be placed under collarbone on left side of chest with arrow pointing upward.  ?Rub patch adhesive wings for 2 minutes. Remove white label marked "1". Remove the white label marked "2". Rub patch adhesive wings for 2 additional minutes.  ?While looking in a mirror, press and release button in center of patch. A small green light will flash 3-4 times. This will be your only indicator that  the monitor has been turned on. ?  ?Do not shower for the first 24 hours. You may shower after the first 24 hours.  ?Press the button if you feel a symptom. You will hear a small click. Record Date, Time and Symptom in the Patient Logbook.  ?When you are ready to remove the patch, follow instructions on the last 2 pages of the Patient Logbook. Stick patch monitor onto the last page of Patient Logbook.  ?Place Patient  Logbook in the blue and white box.  Use locking tab on box and tape box closed securely.  The blue and white box has prepaid postage on it. Please place it in the mailbox as soon as possible. Your physician should have your test results approximately 7 days after the monitor has been mailed back to [facility].  ?Call [facility] at [phone] if you have questions regarding your ZIO XT patch monitor. Call them immediately if you see an orange light blinking on your monitor.  ?If your monitor falls off in less than 4 days, contact our Monitor department at [phone]. ?If your monitor becomes loose or falls off after 4 days call [employer] at [phone] for suggestions on securing your monitor.? ? ? ?Follow-Up: ?At [facility], you and your health needs are our priority.  As part of our continuing mission to provide you with exceptional heart care, we have created designated Provider Care Teams.  These Care Teams include your primary Cardiologist (physician) and Advanced Practice Providers (APPs -  Physician Assistants and Nurse Practitioners) who all work together to provide you with the care you need, when you need it. ? ?We recommend signing up for the patient portal called "MyChart".  Sign up information is provided on this After Visit Summary.  MyChart is used to connect with patients for Virtual Visits (Telemedicine).  Patients are able to view lab/test results, encounter notes, upcoming appointments, etc.  Non-urgent messages can be sent to your provider as well.   ?To learn more about what you can do with MyChart, go to [email or web address].   ? ?Your next appointment:   ?4 month(s) ? ?The format for your next appointment:   ?In Person ? ?Provider:   ?Dr. Gardiner Rhyme ?

## 2021-10-31 NOTE — Progress Notes (Signed)
?Cardiology Office Note:   ? ?Date:  10/31/2021  ? ?ID:  JARRETT CHICOINE, DOB 1965/09/05, MRN [id number] ? ?PCP:  Maximiano Coss, NP  ?Cardiologist:  None  ?Electrophysiologist:  None  ? ?Referring MD: Maximiano Coss, NP  ? ?Chief Complaint  ?Patient presents with  ? New Patient (Initial Visit)  ? Palpitations  ? ? ?History of Present Illness:   ? ?Grace [name] is a 56 y.o. female with a hx of hypertension who presents as a ED referral for EKG abnormalities.  She was seen in the ED on 10/23/21 with palpitations.  Also reported lightheadedness.  EKG with first-degree AV block.  Troponin negative x2.   ? ?She reports that she has been having dizziness and palpitations.  Reports palpitations feel like fluttering in her heart, occur every 1 to 2 days.  Has happened for last 2 weeks.  Typically last for few seconds and resolves.  Also with intermittent lightheadedness last week, but has improved this week.  Particularly noticed it when she stood.  She denies any chest pain, dyspnea, syncope, or lower extremity edema.  Smoked on and off for 10 years, quit in 1998.  Drinks 1 to 2 cups of coffee per day, has not noted relationship with caffeine intake and palpitations.  She walks 3 times a week for 30 minutes, denies any exertional symptoms.  No known history of heart disease in her immediate family, but reports sister is currently undergoing a cardiac work-up for which she is unsure of details ? ?Past Medical History:  ?Diagnosis Date  ? Allergy   ? Arthritis   ? Hypertension   ? ? ?Past Surgical History:  ?Procedure Laterality Date  ? DILATION AND CURETTAGE OF UTERUS  2005, 2006  ? [place]  ? right  ? ? ?Current Medications: ?Current Meds  ?Medication Sig  ? hydrochlorothiazide (HYDRODIURIL) 25 MG tablet TAKE 1 TABLET(25 MG) BY MOUTH DAILY (Patient taking differently: Take 25 mg by mouth daily.)  ? ibuprofen (ADVIL) 200 MG tablet Take 400 mg by mouth every 6 (six) hours as needed for headache.  ? Probiotic  Product (PROBIOTIC PEARLS PO) Take 1 capsule by mouth daily.  ? [DISCONTINUED] naproxen (NAPROSYN) 375 MG tablet Take 1 tablet (375 mg total) by mouth 2 (two) times daily.  ? [DISCONTINUED] tamsulosin (FLOMAX) 0.4 MG CAPS capsule Take 1 capsule (0.4 mg total) by mouth daily.  ? [DISCONTINUED] valACYclovir (VALTREX) 1000 MG tablet Take 1 tablet (1,000 mg total) by mouth 3 (three) times daily.  ?  ? ?Allergies:   Patient has no known allergies.  ? ?Social History  ? ?Socioeconomic History  ? Marital status: Married  ?  Spouse name: Not on file  ? Number of children: Not on file  ? Years of education: Not on file  ? Highest education level: Not on file  ?Occupational History  ? Not on file  ?Tobacco Use  ? Smoking status: Former  ?  Types: Cigarettes  ?  Quit date: 01/21/1997  ?  Years since quitting: 24.7  ? Smokeless tobacco: Never  ?Vaping Use  ? Vaping Use: Never used  ?Substance and Sexual Activity  ? Alcohol use: Yes  ?  Alcohol/week: 2.0 standard drinks  ?  Types: 2 Glasses of wine per week  ? Drug use: No  ? Sexual activity: Yes  ?  Partners: Male  ?  Birth control/protection: Post-menopausal  ?Other Topics Concern  ? Not on file  ?Social History Narrative  ?  Not on file  ? ?Social Determinants of Health  ? ?Financial Resource Strain: Not on file  ?Food Insecurity: Not on file  ?Transportation Needs: Not on file  ?Physical Activity: Not on file  ?Stress: Not on file  ?Social Connections: Not on file  ?  ? ?Family History: ?The patient's family history includes Breast cancer (age of onset: 64) in her mother; Cancer in her father and another family member; Colon polyps in her father and mother; Colon polyps (age of onset: 60) in her sister; Colon polyps (age of onset: 51) in her brother; Diabetes in her paternal grandmother; Hypertension in her father, mother, and another family member; Stroke in her paternal grandfather. There is no history of Stomach cancer. ? ?ROS:   ?Please see the history of present illness.     ? All other systems reviewed and are negative. ? ?EKGs/Labs/Other Studies Reviewed:   ? ?The following studies were reviewed today: ? ? ?EKG:   ?10/31/21: Normal sinus rhythm, rate 72, no ST abnormalities, normal PR interval ? ?Recent Labs: ?10/27/2021: ALT 11; BUN 20; Creatinine, Ser 0.70; Hemoglobin 13.4; Platelets 262.0; Potassium 3.8; Sodium 139; TSH 2.04  ?Recent Lipid Panel ?   ?Component Value Date/Time  ? CHOL 179 10/27/2021 0831  ? CHOL 185 05/17/2020 1646  ? TRIG 65.0 10/27/2021 0831  ? HDL 74.50 10/27/2021 0831  ? HDL 78 05/17/2020 1646  ? CHOLHDL 2 10/27/2021 0831  ? VLDL 13.0 10/27/2021 0831  ? [place] 91 10/27/2021 0831  ? [place] 88 05/17/2020 1646  ? ? ?Physical Exam:   ? ?VS:  BP 116/80 (BP Location: Left Arm, Patient Position: Sitting, Cuff Size: Normal)   Pulse 72   Ht 5' 2.5" (1.588 m)   Wt 114 lb (51.7 kg)   LMP 10/26/2015 (Approximate)   BMI 20.52 kg/m?    ? ?Wt Readings from Last 3 Encounters:  ?10/31/21 114 lb (51.7 kg)  ?10/27/21 119 lb 9.6 oz (54.3 kg)  ?04/29/21 118 lb 6.4 oz (53.7 kg)  ?  ? ?GEN: \ Well nourished, well developed in no acute distress ?HEENT: Normal ?NECK: No JVD; No carotid bruits ?LYMPHATICS: No lymphadenopathy ?CARDIAC: RRR, no murmurs, rubs, gallops ?RESPIRATORY:  Clear to auscultation without rales, wheezing or rhonchi  ?ABDOMEN: Soft, non-tender, non-distended ?MUSCULOSKELETAL:  No edema; No deformity  ?SKIN: Warm and dry ?NEUROLOGIC:  Alert and oriented x 3 ?PSYCHIATRIC:  Normal affect  ? ?ASSESSMENT:   ? ?1. Palpitations   ?2. Lightheadedness   ?3. Essential hypertension   ?4. Nonspecific abnormal electrocardiogram (ECG) (EKG)   ? ?PLAN:   ? ?Palpitations/lightheadedness: Description concerning for arrhythmia, evaluate with Zio patch x7 days.  Normal TSH.  Check BMET, magnesium is on HCTZ.  Check echocardiogram to rule out structural heart disease ? ?Hypertension: On hydrochlorothiazide 25 mg daily.  Appears controlled ? ?Abnormal EKG: PR prolongation on EKG at  ED visit March 2023, normal on EKG today.  No further work-up needed ? ?RTC in 4 months ? ? ?Medication Adjustments/Labs and Tests Ordered: ?Current medicines are reviewed at length with the patient today.  Concerns regarding medicines are outlined above.  ?Orders Placed This Encounter  ?Procedures  ? Basic metabolic panel  ? Magnesium  ? LONG TERM MONITOR (3-14 DAYS)  ? EKG 12-Lead  ? ECHOCARDIOGRAM COMPLETE  ? ?No orders of the defined types were placed in this encounter. ? ? ?Patient Instructions  ?Medication Instructions:  ?Your physician recommends that you continue on your current medications as directed. Please  refer to the Current Medication list given to you today. ? ?*If you need a refill on your cardiac medications before your next appointment, please call your pharmacy* ? ? ?Lab Work: ?BMET, [place] today ? ?If you have labs (blood work) drawn today and your tests are completely normal, you will receive your results only by: ?MyChart Message (if you have MyChart) OR ?A paper copy in the mail ?If you have any lab test that is abnormal or we need to change your treatment, we will call you to review the results. ? ? ?Testing/Procedures: ?Your physician has requested that you have an echocardiogram. Echocardiography is a painless test that uses sound waves to create images of your heart. It provides your doctor with information about the size and shape of your heart and how well your heart?s chambers and valves are working. This procedure takes approximately one hour. There are no restrictions for this procedure. ?This will be done at our [facility] location:  [address] ? ?ZIO XT- Long Term Monitor Instructions  ? ?Your physician has requested you wear a ZIO patch monitor for _7__ days.  ?This is a single patch monitor.   [employer] supplies one patch monitor per enrollment. Additional stickers are not available. Please do not apply patch if you will be having a Nuclear Stress Test,  Echocardiogram, Cardiac CT, MRI, or Chest Xray during the period you would be wearing the monitor. The patch cannot be worn during these tests. You cannot remove and re-apply the ZIO XT patch monitor.  ?Your ZIO

## 2021-10-31 NOTE — Progress Notes (Unsigned)
Enrolled patient for a 7 day Zio XT monitor to be mailed to patients home around 4/17 ?

## 2021-11-01 LAB — BASIC METABOLIC PANEL
BUN/Creatinine Ratio: 33 — ABNORMAL HIGH (ref 9–23)
BUN: 26 mg/dL — ABNORMAL HIGH (ref 6–24)
CO2: 25 mmol/L (ref 20–29)
Calcium: 10 mg/dL (ref 8.7–10.2)
Chloride: 99 mmol/L (ref 96–106)
Creatinine, Ser: 0.78 mg/dL (ref 0.57–1.00)
Glucose: 93 mg/dL (ref 70–99)
Potassium: 4.1 mmol/L (ref 3.5–5.2)
Sodium: 141 mmol/L (ref 134–144)
eGFR: 90 mL/min/{1.73_m2} (ref >59)

## 2021-11-01 LAB — MAGNESIUM: Magnesium: 2 mg/dL (ref 1.6–2.3)

## 2021-11-04 NOTE — Telephone Encounter (Signed)
Pt was able to be seen by cardiology ? ?Thanks, ? ?Rich

## 2021-11-17 ENCOUNTER — Ambulatory Visit (HOSPITAL_COMMUNITY): Payer: 59 | Attending: Internal Medicine

## 2021-11-17 DIAGNOSIS — I10 Essential (primary) hypertension: Secondary | ICD-10-CM

## 2021-11-17 DIAGNOSIS — R002 Palpitations: Secondary | ICD-10-CM | POA: Insufficient documentation

## 2021-11-17 LAB — ECHOCARDIOGRAM COMPLETE
Area-P 1/2: 3.56 cm2
S' Lateral: 2.3 cm

## 2021-12-07 DIAGNOSIS — R002 Palpitations: Secondary | ICD-10-CM

## 2021-12-09 ENCOUNTER — Other Ambulatory Visit: Payer: Self-pay | Admitting: Registered Nurse

## 2021-12-09 DIAGNOSIS — I10 Essential (primary) hypertension: Secondary | ICD-10-CM

## 2022-03-01 NOTE — Progress Notes (Signed)
Cardiology Office Note:    Date:  03/02/2022   ID:  Grace [name], DOB Jun 10, 1966, MRN [id number]  PCP:  Maximiano Coss, NP  Cardiologist:  None  Electrophysiologist:  None   Referring MD: Maximiano Coss, NP   Chief Complaint  Patient presents with   Palpitations    History of Present Illness:    Grace [name] is a 56 y.o. female with a hx of hypertension who presents for follow-up.  She was seen in the ED on 10/23/21 with palpitations.  Also reported lightheadedness.  EKG with first-degree AV block.  Troponin negative x2.    She reports that she has been having dizziness and palpitations.  Reports palpitations feel like fluttering in her heart, occur every 1 to 2 days.  Has happened for last 2 weeks.  Typically last for few seconds and resolves.  Also with intermittent lightheadedness last week, but has improved this week.  Particularly noticed it when she stood.  She denies any chest pain, dyspnea, syncope, or lower extremity edema.  Smoked on and off for 10 years, quit in 1998.  Drinks 1 to 2 cups of coffee per day, has not noted relationship with caffeine intake and palpitations.  She walks 3 times a week for 30 minutes, denies any exertional symptoms.  No known history of heart disease in her immediate family, but reports sister is currently undergoing a cardiac work-up for which she is unsure of details  At initial clinic visit 10/31/21, Zio patch was ordered but has not been done.  Echocardiogram 11/17/2021 showed normal biventricular function, no significant valvular disease.  Since last clinic visit, she reports that she has been doing well.  Reports palpitations have resolved.  Denies any chest pain, dyspnea, lightheadedness, syncope, or lower extremity edema.      Past Medical History:  Diagnosis Date   Allergy    Arthritis    Hypertension     Past Surgical History:  Procedure Laterality Date   DILATION AND CURETTAGE OF UTERUS  2005, 2006   [place]    right    Current Medications: Current Meds  Medication Sig   hydrochlorothiazide (HYDRODIURIL) 25 MG tablet TAKE 1 TABLET(25 MG) BY MOUTH DAILY     Allergies:   Patient has no known allergies.   Social History   Socioeconomic History   Marital status: Married    Spouse name: Not on file   Number of children: Not on file   Years of education: Not on file   Highest education level: Not on file  Occupational History   Not on file  Tobacco Use   Smoking status: Former    Types: Cigarettes    Quit date: 01/21/1997    Years since quitting: 25.1   Smokeless tobacco: Never  Vaping Use   Vaping Use: Never used  Substance and Sexual Activity   Alcohol use: Yes    Alcohol/week: 2.0 standard drinks of alcohol    Types: 2 Glasses of wine per week   Drug use: No   Sexual activity: Yes    Partners: Male    Birth control/protection: Post-menopausal  Other Topics Concern   Not on file  Social History Narrative   Not on file   Social Determinants of Health   Financial Resource Strain: Low Risk  (06/09/2019)   Overall Financial Resource Strain (CARDIA)    Difficulty of Paying Living Expenses: Not hard at all  Food Insecurity: No Food Insecurity (06/09/2019)   Hunger  Vital Sign    Worried About Charity fundraiser in the Last Year: Never true    Ran Out of Food in the Last Year: Never true  Transportation Needs: No Transportation Needs (06/09/2019)   PRAPARE - Hydrologist (Medical): No    Lack of Transportation (Non-Medical): No  Physical Activity: Not on file  Stress: Not on file  Social Connections: Not on file     Family History: The patient's family history includes Breast cancer (age of onset: 12) in her mother; Cancer in her father and another family member; Colon polyps in her father and mother; Colon polyps (age of onset: 75) in her sister; Colon polyps (age of onset: 22) in her brother; Diabetes in her paternal grandmother; Hypertension in  her father, mother, and another family member; Stroke in her paternal grandfather. There is no history of Stomach cancer.  ROS:   Please see the history of present illness.     All other systems reviewed and are negative.  EKGs/Labs/Other Studies Reviewed:    The following studies were reviewed today:   EKG:   10/31/21: Normal sinus rhythm, rate 72, no ST abnormalities, normal PR interval  Recent Labs: 10/27/2021: ALT 11; Hemoglobin 13.4; Platelets 262.0; TSH 2.04 10/31/2021: BUN 26; Creatinine, Ser 0.78; Magnesium 2.0; Potassium 4.1; Sodium 141  Recent Lipid Panel    Component Value Date/Time   CHOL 179 10/27/2021 0831   CHOL 185 05/17/2020 1646   TRIG 65.0 10/27/2021 0831   HDL 74.50 10/27/2021 0831   HDL 78 05/17/2020 1646   CHOLHDL 2 10/27/2021 0831   VLDL 13.0 10/27/2021 0831   LDLCALC 91 10/27/2021 0831   LDLCALC 88 05/17/2020 1646    Physical Exam:    VS:  BP 112/76   Pulse 64   Ht '5\' 3"'$  (1.6 m)   Wt 119 lb 6.4 oz (54.2 kg)   LMP 10/26/2015 (Approximate)   SpO2 97%   BMI 21.15 kg/m     Wt Readings from Last 3 Encounters:  03/02/22 119 lb 6.4 oz (54.2 kg)  10/31/21 114 lb (51.7 kg)  10/27/21 119 lb 9.6 oz (54.3 kg)     GEN: Well nourished, well developed in no acute distress HEENT: Normal NECK: No JVD; No carotid bruits LYMPHATICS: No lymphadenopathy CARDIAC: RRR, no murmurs, rubs, gallops RESPIRATORY:  Clear to auscultation without rales, wheezing or rhonchi  ABDOMEN: Soft, non-tender, non-distended MUSCULOSKELETAL:  No edema; No deformity  SKIN: Warm and dry NEUROLOGIC:  Alert and oriented x 3 PSYCHIATRIC:  Normal affect   ASSESSMENT:    1. Palpitations   2. Essential hypertension   3. Hyperlipidemia, unspecified hyperlipidemia type     PLAN:    Palpitations/lightheadedness: Description concerning for arrhythmia.  Normal TSH, electrolytes -At initial clinic visit 10/31/21, Zio patch was ordered.  She reports she wore the ZIO patch back in April  but lost it and just turned it in today.  Will follow-up results.  She reports symptoms have resolved.  Echocardiogram 11/17/2021 showed normal biventricular function, no significant valvular disease.  Hypertension: On hydrochlorothiazide 25 mg daily.  Appears controlled  Abnormal EKG: PR prolongation on EKG at ED visit March 2023, normal on EKG today.  No further work-up needed  Hyperlipidemia: LDL 91 on 10/27/2021.  Check calcium score to guide how aggressive to be in lowering cholesterol  RTC in 1 year   Medication Adjustments/Labs and Tests Ordered: Current medicines are reviewed at length with the patient today.  Concerns regarding medicines are outlined above.  Orders Placed This Encounter  Procedures   CT CARDIAC SCORING (SELF PAY ONLY)   No orders of the defined types were placed in this encounter.   Patient Instructions  Medication Instructions:  Your physician recommends that you continue on your current medications as directed. Please refer to the Current Medication list given to you today.  *If you need a refill on your cardiac medications before your next appointment, please call your pharmacy*  Testing/Procedures: CT coronary calcium score.   Test locations:  [place]   This is $99 out of pocket.   Coronary CalciumScan A coronary calcium scan is an imaging test used to look for deposits of calcium and other fatty materials (plaques) in the inner lining of the blood vessels of the heart (coronary arteries). These deposits of calcium and plaques can partly clog and narrow the coronary arteries without producing any symptoms or warning signs. This puts a person at risk for a heart attack. This test can detect these deposits before symptoms develop. Tell a health care provider about: Any allergies you have. All medicines you are taking, including vitamins, herbs, eye drops, creams, and over-the-counter medicines. Any problems you or  family members have had with anesthetic medicines. Any blood disorders you have. Any surgeries you have had. Any medical conditions you have. Whether you are pregnant or may be pregnant. What are the risks? Generally, this is a safe procedure. However, problems may occur, including: Harm to a pregnant woman and her unborn baby. This test involves the use of radiation. Radiation exposure can be dangerous to a pregnant woman and her unborn baby. If you are pregnant, you generally should not have this procedure done. Slight increase in the risk of cancer. This is because of the radiation involved in the test. What happens before the procedure? No preparation is needed for this procedure. What happens during the procedure? You will undress and remove any jewelry around your neck or chest. You will put on a hospital gown. Sticky electrodes will be placed on your chest. The electrodes will be connected to an electrocardiogram (ECG) machine to record a tracing of the electrical activity of your heart. A CT scanner will take pictures of your heart. During this time, you will be asked to lie still and hold your breath for 2-3 seconds while a picture of your heart is being taken. The procedure may vary among health care providers and hospitals. What happens after the procedure? You can get dressed. You can return to your normal activities. It is up to you to get the results of your test. Ask your health care provider, or the department that is doing the test, when your results will be ready. Summary A coronary calcium scan is an imaging test used to look for deposits of calcium and other fatty materials (plaques) in the inner lining of the blood vessels of the heart (coronary arteries). Generally, this is a safe procedure. Tell your health care provider if you are pregnant or may be pregnant. No preparation is needed for this procedure. A CT scanner will take pictures of your heart. You can return to  your normal activities after the scan is done. This information is not intended to replace advice given to you by your health care provider. Make sure you discuss any questions you have with your health care provider. Document Released: 01/09/2008 Document Revised: 06/01/2016 Document Reviewed: 06/01/2016 Elsevier Interactive Patient Education  2017 Elsevier  Inc[employer]  Follow-Up: At [facility], you and your health needs are our priority.  As part of our continuing mission to provide you with exceptional heart care, we have created designated Provider Care Teams.  These Care Teams include your primary Cardiologist (physician) and Advanced Practice Providers (APPs -  Physician Assistants and Nurse Practitioners) who all work together to provide you with the care you need, when you need it.  We recommend signing up for the patient portal called "MyChart".  Sign up information is provided on this After Visit Summary.  MyChart is used to connect with patients for Virtual Visits (Telemedicine).  Patients are able to view lab/test results, encounter notes, upcoming appointments, etc.  Non-urgent messages can be sent to your provider as well.   To learn more about what you can do with MyChart, go to [email or web address].    Your next appointment:   12 month(s)  The format for your next appointment:   In Person  Provider:   Dr. Gardiner Rhyme        Signed, Donato Heinz, MD  03/02/2022 5:04 PM    [place]

## 2022-03-02 ENCOUNTER — Encounter: Payer: Self-pay | Admitting: Cardiology

## 2022-03-02 ENCOUNTER — Ambulatory Visit: Payer: 59 | Admitting: Cardiology

## 2022-03-02 VITALS — BP 112/76 | HR 64 | Ht 63.0 in | Wt 119.4 lb

## 2022-03-02 DIAGNOSIS — I10 Essential (primary) hypertension: Secondary | ICD-10-CM | POA: Diagnosis not present

## 2022-03-02 DIAGNOSIS — R002 Palpitations: Secondary | ICD-10-CM | POA: Diagnosis not present

## 2022-03-02 DIAGNOSIS — E785 Hyperlipidemia, unspecified: Secondary | ICD-10-CM

## 2022-03-02 NOTE — Patient Instructions (Signed)
Medication Instructions:  Your physician recommends that you continue on your current medications as directed. Please refer to the Current Medication list given to you today.  *If you need a refill on your cardiac medications before your next appointment, please call your pharmacy*  Testing/Procedures: CT coronary calcium score.   Test locations:  [place]   This is $99 out of pocket.   Coronary CalciumScan A coronary calcium scan is an imaging test used to look for deposits of calcium and other fatty materials (plaques) in the inner lining of the blood vessels of the heart (coronary arteries). These deposits of calcium and plaques can partly clog and narrow the coronary arteries without producing any symptoms or warning signs. This puts a person at risk for a heart attack. This test can detect these deposits before symptoms develop. Tell a health care provider about: Any allergies you have. All medicines you are taking, including vitamins, herbs, eye drops, creams, and over-the-counter medicines. Any problems you or family members have had with anesthetic medicines. Any blood disorders you have. Any surgeries you have had. Any medical conditions you have. Whether you are pregnant or may be pregnant. What are the risks? Generally, this is a safe procedure. However, problems may occur, including: Harm to a pregnant woman and her unborn baby. This test involves the use of radiation. Radiation exposure can be dangerous to a pregnant woman and her unborn baby. If you are pregnant, you generally should not have this procedure done. Slight increase in the risk of cancer. This is because of the radiation involved in the test. What happens before the procedure? No preparation is needed for this procedure. What happens during the procedure? You will undress and remove any jewelry around your neck or chest. You will put on a hospital gown. Sticky electrodes  will be placed on your chest. The electrodes will be connected to an electrocardiogram (ECG) machine to record a tracing of the electrical activity of your heart. A CT scanner will take pictures of your heart. During this time, you will be asked to lie still and hold your breath for 2-3 seconds while a picture of your heart is being taken. The procedure may vary among health care providers and hospitals. What happens after the procedure? You can get dressed. You can return to your normal activities. It is up to you to get the results of your test. Ask your health care provider, or the department that is doing the test, when your results will be ready. Summary A coronary calcium scan is an imaging test used to look for deposits of calcium and other fatty materials (plaques) in the inner lining of the blood vessels of the heart (coronary arteries). Generally, this is a safe procedure. Tell your health care provider if you are pregnant or may be pregnant. No preparation is needed for this procedure. A CT scanner will take pictures of your heart. You can return to your normal activities after the scan is done. This information is not intended to replace advice given to you by your health care provider. Make sure you discuss any questions you have with your health care provider. Document Released: 01/09/2008 Document Revised: 06/01/2016 Document Reviewed: 06/01/2016 Elsevier Interactive Patient Education  2017 [place]: At [facility], you and your health needs are our priority.  As part of our continuing mission to provide you with exceptional heart care, we have created designated Provider Care Teams.  These Care Teams include  your primary Cardiologist (physician) and Advanced Practice Providers (APPs -  Physician Assistants and Nurse Practitioners) who all work together to provide you with the care you need, when you need it.  We recommend signing up for the patient portal  called "MyChart".  Sign up information is provided on this After Visit Summary.  MyChart is used to connect with patients for Virtual Visits (Telemedicine).  Patients are able to view lab/test results, encounter notes, upcoming appointments, etc.  Non-urgent messages can be sent to your provider as well.   To learn more about what you can do with MyChart, go to [email or web address].    Your next appointment:   12 month(s)  The format for your next appointment:   In Person  Provider:   Dr. Gardiner Rhyme

## 2022-03-27 ENCOUNTER — Other Ambulatory Visit (HOSPITAL_BASED_OUTPATIENT_CLINIC_OR_DEPARTMENT_OTHER): Payer: 59

## 2022-04-17 ENCOUNTER — Other Ambulatory Visit (HOSPITAL_BASED_OUTPATIENT_CLINIC_OR_DEPARTMENT_OTHER): Payer: 59

## 2022-04-20 ENCOUNTER — Encounter: Payer: Self-pay | Admitting: *Deleted

## 2022-05-25 ENCOUNTER — Ambulatory Visit (HOSPITAL_BASED_OUTPATIENT_CLINIC_OR_DEPARTMENT_OTHER)
Admission: RE | Admit: 2022-05-25 | Discharge: 2022-05-25 | Disposition: A | Discharge status: Home / Self Care | Payer: 59 | Source: Ambulatory Visit | Attending: Cardiology | Admitting: Cardiology

## 2022-05-25 DIAGNOSIS — E785 Hyperlipidemia, unspecified: Secondary | ICD-10-CM | POA: Insufficient documentation

## 2022-07-02 ENCOUNTER — Encounter: Payer: Self-pay | Admitting: *Deleted

## 2022-07-09 ENCOUNTER — Telehealth: Payer: Self-pay | Admitting: Cardiology

## 2022-07-09 DIAGNOSIS — E785 Hyperlipidemia, unspecified: Secondary | ICD-10-CM

## 2022-07-09 NOTE — Telephone Encounter (Signed)
Donato Heinz, MD 05/27/2022  6:24 AM EDT     Calcium score 41 (90th percentile).  Recommend starting atorvastatin 20 mg daily and check fasting lipid panel in 2 months   Unable to reach on cell # Unable to reach on work # - left message  Results sent to in [employer]

## 2022-07-09 NOTE — Telephone Encounter (Signed)
Patient calling call per letter for test results. She says if she does not answer her cell: [phone], to try her work: [phone]

## 2022-07-10 NOTE — Telephone Encounter (Signed)
MB full and cannot receive messages.

## 2022-07-13 MED ORDER — ATORVASTATIN CALCIUM 20 MG PO TABS: 20.0000 mg | ORAL_TABLET | Freq: Every day | ORAL | 3 refills | Status: DC

## 2022-07-13 NOTE — Addendum Note (Signed)
Addended by: Fidel Levy on: 07/13/2022 08:25 AM   Modules accepted: Orders

## 2022-08-25 ENCOUNTER — Ambulatory Visit: Payer: 59 | Admitting: Obstetrics and Gynecology

## 2022-09-17 ENCOUNTER — Other Ambulatory Visit: Payer: Self-pay | Admitting: Obstetrics and Gynecology

## 2022-09-17 DIAGNOSIS — Z1231 Encounter for screening mammogram for malignant neoplasm of breast: Secondary | ICD-10-CM

## 2022-10-29 ENCOUNTER — Encounter: Payer: 59 | Admitting: Registered Nurse

## 2022-11-09 ENCOUNTER — Ambulatory Visit: Payer: 59

## 2022-11-19 ENCOUNTER — Ambulatory Visit
Admission: RE | Admit: 2022-11-19 | Discharge: 2022-11-19 | Disposition: A | Discharge status: Home / Self Care | Payer: 59 | Source: Ambulatory Visit | Attending: Obstetrics and Gynecology | Admitting: Obstetrics and Gynecology

## 2022-11-19 DIAGNOSIS — Z1231 Encounter for screening mammogram for malignant neoplasm of breast: Secondary | ICD-10-CM

## 2023-01-18 ENCOUNTER — Telehealth: Payer: Self-pay | Admitting: Cardiology

## 2023-01-18 DIAGNOSIS — I10 Essential (primary) hypertension: Secondary | ICD-10-CM

## 2023-01-18 NOTE — Telephone Encounter (Signed)
Unable to leave VM message due to voicemail box is full.

## 2023-01-18 NOTE — Telephone Encounter (Signed)
Pt c/o medication issue:  1. Name of Medication: hydrochlorothiazide (HYDRODIURIL) 25 MG tablet   2. How are you currently taking this medication (dosage and times per day)?   3. Are you having a reaction (difficulty breathing--STAT)?   4. What is your medication issue?  Patient states that her provider that was prescribing this for her no longer comes to [facility] and would like to know if Dr. Bjorn Pippin can fill this for her. Please advise.

## 2023-01-19 NOTE — Telephone Encounter (Signed)
Call to patient. VM is full, unable to LM

## 2023-01-20 NOTE — Telephone Encounter (Signed)
Spoke to the pt, her PCP who was  prescribing hydrochlorothiazide 25 mg is no longer in [place], she is currently looking for another PCP.  Pt stated she ran out the medication a week ago, currently asymptomatic. Pt stated if Dr. Bjorn Pippin can refill the medication , she does have scheduled appointment with Dr. Bjorn Pippin on 05/06/23 @1620 . Will forward to MD and nurse for advise. Pt requested the office to call her work phone number.

## 2023-01-21 MED ORDER — HYDROCHLOROTHIAZIDE 25 MG PO TABS: ORAL_TABLET | ORAL | 0 refills | Status: DC

## 2023-01-21 NOTE — Telephone Encounter (Signed)
Pt was calling to check on status of receiving a refill on this medication and getting a callback. Please advise.

## 2023-01-21 NOTE — Telephone Encounter (Signed)
Per OV note by Bjorn Pippin on 03/02/22:  Hypertension: On hydrochlorothiazide 25 mg daily. Appears controlled   Patient now states that PCP who was managing this has left [facility] and she's currently searching for new PCP. Requesting cardiology fill medication for her. Pt scheduled for yearly follow-up with Bjorn Pippin on 05/06/23. Will send in enough medication to cover her until this appt only and she can discuss further with MD during OV. Left detailed message per DPR to make pt aware.

## 2023-01-21 NOTE — Telephone Encounter (Signed)
Yes that is fine to refill

## 2023-04-16 ENCOUNTER — Other Ambulatory Visit: Payer: Self-pay | Admitting: Cardiology

## 2023-04-16 DIAGNOSIS — I10 Essential (primary) hypertension: Secondary | ICD-10-CM

## 2023-05-06 ENCOUNTER — Ambulatory Visit: Payer: 59 | Attending: Cardiology | Admitting: Cardiology

## 2023-05-06 ENCOUNTER — Encounter: Payer: Self-pay | Admitting: Cardiology

## 2023-05-06 VITALS — BP 102/70 | HR 65 | Ht 63.0 in | Wt 121.0 lb

## 2023-05-06 DIAGNOSIS — R002 Palpitations: Secondary | ICD-10-CM | POA: Diagnosis not present

## 2023-05-06 DIAGNOSIS — I10 Essential (primary) hypertension: Secondary | ICD-10-CM | POA: Diagnosis not present

## 2023-05-06 DIAGNOSIS — E785 Hyperlipidemia, unspecified: Secondary | ICD-10-CM

## 2023-05-06 NOTE — Progress Notes (Signed)
Cardiology Office Note:    Date:  05/09/2023   ID:  Grace [name], DOB 1965-10-28, MRN [id number]  PCP:  Grace Agee, NP  Cardiologist:  None  Electrophysiologist:  None   Referring MD: Grace Agee, NP   Chief Complaint  Patient presents with   Palpitations         History of Present Illness:    Grace [name] is a 57 y.o. female with a hx of hypertension who presents for follow-up.  She was seen in the ED on 10/23/21 with palpitations.  Also reported lightheadedness.  EKG with first-degree AV block.  Troponin negative x2.    She reports that she has been having dizziness and palpitations.  Reports palpitations feel like fluttering in her heart, occur every 1 to 2 days.  Has happened for last 2 weeks.  Typically last for few seconds and resolves.  Also with intermittent lightheadedness last week, but has improved this week.  Particularly noticed it when she stood.  She denies any chest pain, dyspnea, syncope, or lower extremity edema.  Smoked on and off for 10 years, quit in 1998.  Drinks 1 to 2 cups of coffee per day, has not noted relationship with caffeine intake and palpitations.  She walks 3 times a week for 30 minutes, denies any exertional symptoms.  No known history of heart disease in her immediate family, but reports sister is currently undergoing a cardiac work-up for which she is unsure of details  Zio x 7 days 10/2021 showed no significant arrhythmias Echocardiogram 11/17/2021 showed normal biventricular function, no significant valvular disease.    Since last clinic visit, she reports she is doing well.  Denies any chest pain, dyspnea, lightheadedness, syncope, lower extremity edema, or palpitations.  Walks 1-3 times per week.     Past Medical History:  Diagnosis Date   Allergy    Arthritis    Hypertension     Past Surgical History:  Procedure Laterality Date   DILATION AND CURETTAGE OF UTERUS  2005, 2006   INGUINAL HERNIA REPAIR  1975   right    Current  Medications: Current Meds  Medication Sig   atorvastatin (LIPITOR) 20 MG tablet Take 1 tablet (20 mg total) by mouth daily.   hydrochlorothiazide (HYDRODIURIL) 25 MG tablet TAKE 1 TABLET BY MOUTH EVERY DAY. Please keep scheduled appointment for future refills. Thank you.     Allergies:   Patient has no known allergies.   Social History   Socioeconomic History   Marital status: Married    Spouse name: Not on file   Number of children: Not on file   Years of education: Not on file   Highest education level: Not on file  Occupational History   Not on file  Tobacco Use   Smoking status: Former    Current packs/day: 0.00    Types: Cigarettes    Quit date: 01/21/1997    Years since quitting: 26.3   Smokeless tobacco: Never  Vaping Use   Vaping status: Never Used  Substance and Sexual Activity   Alcohol use: Yes    Alcohol/week: 2.0 standard drinks of alcohol    Types: 2 Glasses of wine per week   Drug use: No   Sexual activity: Yes    Partners: Male    Birth control/protection: Post-menopausal  Other Topics Concern   Not on file  Social History Narrative   Not on file   Social Determinants of Health   Financial Resource Strain: Low Risk  (  06/09/2019)   Overall Financial Resource Strain (CARDIA)    Difficulty of Paying Living Expenses: Not hard at all  Food Insecurity: No Food Insecurity (06/09/2019)   Hunger Vital Sign    Worried About Running Out of Food in the Last Year: Never true    Ran Out of Food in the Last Year: Never true  Transportation Needs: No Transportation Needs (06/09/2019)   PRAPARE - Administrator, Civil Service (Medical): No    Lack of Transportation (Non-Medical): No  Physical Activity: Not on file  Stress: Not on file  Social Connections: Not on file     Family History: The patient's family history includes Breast cancer (age of onset: 36) in her mother; Cancer in her father and another family member; Colon polyps in her father and  mother; Colon polyps (age of onset: 3) in her sister; Colon polyps (age of onset: 85) in her brother; Diabetes in her paternal grandmother; Hypertension in her father, mother, and another family member; Stroke in her paternal grandfather. There is no history of Stomach cancer.  ROS:   Please see the history of present illness.     All other systems reviewed and are negative.  EKGs/Labs/Other Studies Reviewed:    The following studies were reviewed today:   EKG:   10/31/21: Normal sinus rhythm, rate 72, no ST abnormalities, normal PR interval 05/06/2023: Normal sinus rhythm, rate 65, no ST abnormalities  Recent Labs: No results found for requested labs within last 365 days.  Recent Lipid Panel    Component Value Date/Time   CHOL 179 10/27/2021 0831   CHOL 185 05/17/2020 1646   TRIG 65.0 10/27/2021 0831   HDL 74.50 10/27/2021 0831   HDL 78 05/17/2020 1646   CHOLHDL 2 10/27/2021 0831   VLDL 13.0 10/27/2021 0831   LDLCALC 91 10/27/2021 0831   LDLCALC 88 05/17/2020 1646    Physical Exam:    VS:  BP 102/70   Pulse 65   Ht 5\' 3"  (1.6 m)   Wt 121 lb (54.9 kg)   LMP 10/26/2015 (Approximate)   SpO2 100%   BMI 21.43 kg/m     Wt Readings from Last 3 Encounters:  05/06/23 121 lb (54.9 kg)  03/02/22 119 lb 6.4 oz (54.2 kg)  10/31/21 114 lb (51.7 kg)     GEN: Well nourished, well developed in no acute distress HEENT: Normal NECK: No JVD; No carotid bruits LYMPHATICS: No lymphadenopathy CARDIAC: RRR, no murmurs, rubs, gallops RESPIRATORY:  Clear to auscultation without rales, wheezing or rhonchi  ABDOMEN: Soft, non-tender, non-distended MUSCULOSKELETAL:  No edema; No deformity  SKIN: Warm and dry NEUROLOGIC:  Alert and oriented x 3 PSYCHIATRIC:  Normal affect   ASSESSMENT:    1. Essential hypertension   2. Hyperlipidemia, unspecified hyperlipidemia type   3. Palpitations      PLAN:    Palpitations/lightheadedness: Zio x 7 days 10/2021 showed no significant  arrhythmias.  Reports no further palpitations  Hypertension: Has been on hydrochlorothiazide 25 mg daily but reports has been off her medication for several days.  Soft BP in clinic today despite no medications.  Recommend holding off on HCTZ and asked to check BP twice daily for next week and let us know results  Hyperlipidemia: LDL 91 on 10/27/2021.  Calcium score 41 (90th percentile) on 05/25/2022.  Started atorvastatin 20 mg daily.  Check lipid panel  RTC in 1 year   Medication Adjustments/Labs and Tests Ordered: Current medicines are reviewed at length  with the patient today.  Concerns regarding medicines are outlined above.  Orders Placed This Encounter  Procedures   Basic metabolic panel   Lipid panel   EKG 12-Lead   No orders of the defined types were placed in this encounter.   Patient Instructions  Medication Instructions:  STOP HCTZ *If you need a refill on your cardiac medications before your next appointment, please call your pharmacy*   Lab Work: BMP, Lipid panel If you have labs (blood work) drawn today and your tests are completely normal, you will receive your results only by: MyChart Message (if you have MyChart) OR A paper copy in the mail If you have any lab test that is abnormal or we need to change your treatment, we will call you to review the results.   Follow-Up: At [facility], you and your health needs are our priority.  As part of our continuing mission to provide you with exceptional heart care, we have created designated Provider Care Teams.  These Care Teams include your primary Cardiologist (physician) and Advanced Practice Providers (APPs -  Physician Assistants and Nurse Practitioners) who all work together to provide you with the care you need, when you need it.  We recommend signing up for the patient portal called "MyChart".  Sign up information is provided on this After Visit Summary.  MyChart is used to connect with patients for  Virtual Visits (Telemedicine).  Patients are able to view lab/test results, encounter notes, upcoming appointments, etc.  Non-urgent messages can be sent to your provider as well.   To learn more about what you can do with MyChart, go to [email or web address].    Your next appointment:   1 year(s)  Provider:   Dr Bjorn Pippin    Signed, Grace Ishikawa, MD  05/09/2023 9:57 PM    [facility]

## 2023-05-06 NOTE — Patient Instructions (Signed)
Medication Instructions:  STOP HCTZ *If you need a refill on your cardiac medications before your next appointment, please call your pharmacy*   Lab Work: BMP, Lipid panel If you have labs (blood work) drawn today and your tests are completely normal, you will receive your results only by: MyChart Message (if you have MyChart) OR A paper copy in the mail If you have any lab test that is abnormal or we need to change your treatment, we will call you to review the results.   Follow-Up: At [facility], you and your health needs are our priority.  As part of our continuing mission to provide you with exceptional heart care, we have created designated Provider Care Teams.  These Care Teams include your primary Cardiologist (physician) and Advanced Practice Providers (APPs -  Physician Assistants and Nurse Practitioners) who all work together to provide you with the care you need, when you need it.  We recommend signing up for the patient portal called "MyChart".  Sign up information is provided on this After Visit Summary.  MyChart is used to connect with patients for Virtual Visits (Telemedicine).  Patients are able to view lab/test results, encounter notes, upcoming appointments, etc.  Non-urgent messages can be sent to your provider as well.   To learn more about what you can do with MyChart, go to [email or web address].    Your next appointment:   1 year(s)  Provider:   Dr Bjorn Pippin

## 2023-06-11 LAB — BASIC METABOLIC PANEL
BUN/Creatinine Ratio: 24 — ABNORMAL HIGH (ref 9–23)
BUN: 18 mg/dL (ref 6–24)
CO2: 30 mmol/L — ABNORMAL HIGH (ref 20–29)
Calcium: 9.8 mg/dL (ref 8.7–10.2)
Chloride: 102 mmol/L (ref 96–106)
Creatinine, Ser: 0.74 mg/dL (ref 0.57–1.00)
Glucose: 99 mg/dL (ref 70–99)
Potassium: 3.4 mmol/L — ABNORMAL LOW (ref 3.5–5.2)
Sodium: 143 mmol/L (ref 134–144)
eGFR: 95 mL/min/{1.73_m2} (ref >59)

## 2023-06-11 LAB — LIPID PANEL
Chol/HDL Ratio: 1.7 {ratio} (ref 0.0–4.4)
Cholesterol, Total: 159 mg/dL (ref 100–199)
HDL: 91 mg/dL (ref >39)
LDL Chol Calc (NIH): 57 mg/dL (ref 0–99)
Triglycerides: 49 mg/dL (ref 0–149)
VLDL Cholesterol Cal: 11 mg/dL (ref 5–40)

## 2023-06-15 ENCOUNTER — Telehealth: Payer: Self-pay | Admitting: Cardiology

## 2023-06-15 NOTE — Telephone Encounter (Signed)
Patient returned RN's call regarding results and stated can reach her at her work number [phone]) after 9:00 am.

## 2023-06-15 NOTE — Telephone Encounter (Signed)
Spoke to patient and below message relayed. She report she has been off the hydrochlorothiazide for about one week. She had a few pills left so she just want to finish them up. She stated that she hasn't been as compliant with taking her BP as she should. She is at work but will send her readings through MyChart later today.    Little Ishikawa, MD 06/11/2023  6:33 AM EST   Cholesterol looks great   Her potassium is low.  Did she stop taking HCTZ like we talked about at clinic visit?  Plan was to stop HCTZ and check BP for next 2 weeks and let us know results but looks like we have not heard back from her.

## 2023-06-18 ENCOUNTER — Telehealth: Payer: Self-pay | Admitting: Cardiology

## 2023-06-18 NOTE — Telephone Encounter (Signed)
Called and spoke to patient. Below message relayed to patient per Dr Bjorn Pippin. She states she has been off her hydrochlorothiazide for about 1 week. She also stated she lost the paper with the BP readings. She will start today getting BP readings and send them over in 2 weeks.     Little Ishikawa, MD 06/11/2023  6:33 AM EST   Cholesterol looks great   Her potassium is low.  Did she stop taking HCTZ like we talked about at clinic visit?  Plan was to stop HCTZ and check BP for next 2 weeks and let us know results but looks like we have not heard back from her.

## 2023-06-18 NOTE — Telephone Encounter (Signed)
Patient is returning call to discuss lab results.

## 2023-07-05 ENCOUNTER — Encounter: Payer: Self-pay | Admitting: Nurse Practitioner

## 2023-07-05 ENCOUNTER — Ambulatory Visit: Payer: 59 | Admitting: Nurse Practitioner

## 2023-07-05 VITALS — BP 126/82 | HR 72 | Ht 63.0 in | Wt 121.0 lb

## 2023-07-05 DIAGNOSIS — F5101 Primary insomnia: Secondary | ICD-10-CM

## 2023-07-05 DIAGNOSIS — I83892 Varicose veins of left lower extremities with other complications: Secondary | ICD-10-CM

## 2023-07-05 DIAGNOSIS — N951 Menopausal and female climacteric states: Secondary | ICD-10-CM | POA: Insufficient documentation

## 2023-07-05 DIAGNOSIS — Z1211 Encounter for screening for malignant neoplasm of colon: Secondary | ICD-10-CM | POA: Diagnosis not present

## 2023-07-05 DIAGNOSIS — Z124 Encounter for screening for malignant neoplasm of cervix: Secondary | ICD-10-CM | POA: Diagnosis not present

## 2023-07-05 DIAGNOSIS — J0100 Acute maxillary sinusitis, unspecified: Secondary | ICD-10-CM | POA: Insufficient documentation

## 2023-07-05 DIAGNOSIS — I10 Essential (primary) hypertension: Secondary | ICD-10-CM

## 2023-07-05 DIAGNOSIS — Z0000 Encounter for general adult medical examination without abnormal findings: Secondary | ICD-10-CM | POA: Insufficient documentation

## 2023-07-05 MED ORDER — TRAZODONE HCL 50 MG PO TABS: 25.0000 mg | ORAL_TABLET | Freq: Every evening | ORAL | 6 refills | Status: DC | PRN

## 2023-07-05 MED ORDER — AZITHROMYCIN 250 MG PO TABS: ORAL_TABLET | ORAL | 0 refills | Status: AC

## 2023-07-05 NOTE — Assessment & Plan Note (Signed)
Well controlled blood pressures since stopping hydrochlorothiazide about 2 weeks ago. I suspect this was a contributor to her potassium abnormality. Will repeat labs -Continue to monitor BP at home and report if readings go above 130/80 on a consistent basis

## 2023-07-05 NOTE — Assessment & Plan Note (Signed)
Experiencing mood swings, trouble sleeping, and has been without a period for several years, indicating post-menopausal status. Hormonal fluctuations likely contributing to symptoms. Discussed potential benefits of hormone level testing. Discussed trazodone for sleep issues, noting it is well-tolerated with minimal side effects and can be adjusted as needed. - Order hormone level blood work - Prescribe trazodone for sleep issues, starting with a low dose and adjusting as needed

## 2023-07-05 NOTE — Patient Instructions (Signed)
Insomnia Insomnia is a sleep disorder that makes it difficult to fall asleep or stay asleep. Insomnia can cause fatigue, low energy, difficulty concentrating, mood swings, and poor performance at work or school. There are three different ways to classify insomnia: Difficulty falling asleep. Difficulty staying asleep. Waking up too early in the morning. Any type of insomnia can be long-term (chronic) or short-term (acute). Both are common. Short-term insomnia usually lasts for 3 months or less. Chronic insomnia occurs at least three times a week for longer than 3 months. What are the causes? Insomnia may be caused by another condition, situation, or substance, such as: Having certain mental health conditions, such as anxiety and depression. Using caffeine, alcohol, tobacco, or drugs. Having gastrointestinal conditions, such as gastroesophageal reflux disease (GERD). Having certain medical conditions. These include: Asthma. Alzheimer's disease. Stroke. Chronic pain. An overactive thyroid gland (hyperthyroidism). Other sleep disorders, such as restless legs syndrome and sleep apnea. Menopause. Sometimes, the cause of insomnia may not be known. What increases the risk? Risk factors for insomnia include: Gender. Females are affected more often than males. Age. Insomnia is more common as people get older. Stress and certain medical and mental health conditions. Lack of exercise. Having an irregular work schedule. This may include working night shifts and traveling between different time zones. What are the signs or symptoms? If you have insomnia, the main symptom is having trouble falling asleep or having trouble staying asleep. This may lead to other symptoms, such as: Feeling tired or having low energy. Feeling nervous about going to sleep. Not feeling rested in the morning. Having trouble concentrating. Feeling irritable, anxious, or depressed. How is this diagnosed? This condition  may be diagnosed based on: Your symptoms and medical history. Your health care provider may ask about: Your sleep habits. Any medical conditions you have. Your mental health. A physical exam. How is this treated? Treatment for insomnia depends on the cause. Treatment may focus on treating an underlying condition that is causing the insomnia. Treatment may also include: Medicines to help you sleep. Counseling or therapy. Lifestyle adjustments to help you sleep better. Follow these instructions at home: Eating and drinking  Limit or avoid alcohol, caffeinated beverages, and products that contain nicotine and tobacco, especially close to bedtime. These can disrupt your sleep. Do not eat a large meal or eat spicy foods right before bedtime. This can lead to digestive discomfort that can make it hard for you to sleep. Sleep habits  Keep a sleep diary to help you and your health care provider figure out what could be causing your insomnia. Write down: When you sleep. When you wake up during the night. How well you sleep and how rested you feel the next day. Any side effects of medicines you are taking. What you eat and drink. Make your bedroom a dark, comfortable place where it is easy to fall asleep. Put up shades or blackout curtains to block light from outside. Use a white noise machine to block noise. Keep the temperature cool. Limit screen use before bedtime. This includes: Not watching TV. Not using your smartphone, tablet, or computer. Stick to a routine that includes going to bed and waking up at the same times every day and night. This can help you fall asleep faster. Consider making a quiet activity, such as reading, part of your nighttime routine. Try to avoid taking naps during the day so that you sleep better at night. Get out of bed if you are still awake after   15 minutes of trying to sleep. Keep the lights down, but try reading or doing a quiet activity. When you feel  sleepy, go back to bed. General instructions Take over-the-counter and prescription medicines only as told by your health care provider. Exercise regularly as told by your health care provider. However, avoid exercising in the hours right before bedtime. Use relaxation techniques to manage stress. Ask your health care provider to suggest some techniques that may work well for you. These may include: Breathing exercises. Routines to release muscle tension. Visualizing peaceful scenes. Make sure that you drive carefully. Do not drive if you feel very sleepy. Keep all follow-up visits. This is important. Contact a health care provider if: You are tired throughout the day. You have trouble in your daily routine due to sleepiness. You continue to have sleep problems, or your sleep problems get worse. Get help right away if: You have thoughts about hurting yourself or someone else. Get help right away if you feel like you may hurt yourself or others, or have thoughts about taking your own life. Go to your nearest emergency room or: Call 911. Call the National Suicide Prevention Lifeline at 1-800-273-8255 or 988. This is open 24 hours a day. Text the Crisis Text Line at 741741. Summary Insomnia is a sleep disorder that makes it difficult to fall asleep or stay asleep. Insomnia can be long-term (chronic) or short-term (acute). Treatment for insomnia depends on the cause. Treatment may focus on treating an underlying condition that is causing the insomnia. Keep a sleep diary to help you and your health care provider figure out what could be causing your insomnia. This information is not intended to replace advice given to you by your health care provider. Make sure you discuss any questions you have with your health care provider. Document Revised: 06/23/2021 Document Reviewed: 06/23/2021 Elsevier Patient Education  2024 [employer]  

## 2023-07-05 NOTE — Assessment & Plan Note (Signed)

## 2023-07-05 NOTE — Progress Notes (Signed)
Grace Clamp, DNP, AGNP-c [facility] [address] [place], [place] [address] Main Office [phone]  BP 126/82   Pulse 72   Ht 5\' 3"  (1.6 m)   Wt 121 lb (54.9 kg)   LMP 10/26/2015 (Approximate)   BMI 21.43 kg/m    Subjective:    Patient ID: Grace [name], female    DOB: 1965/12/17, 57 y.o.   MRN: [id number]  HPI: Grace [name] is a 57 y.o. female presenting on 07/05/2023 for comprehensive medical examination.   History of Present Illness Grace [name] presents with concerns about hormone levels and sleep disturbances. She reports experiencing mood swings and is unsure if hormones and menopausal status are contributing. She has not had a menstrual period for several years. She also reports difficulty falling asleep and staying asleep, which has been ongoing for an unspecified duration.  The patient has been experiencing congestion and chest discomfort for the past couple of weeks, which she describes as a sensation of something in her chest when breathing. She denies any associated fever, chills, or wheezing.  The patient also reports a history of varicose veins, for which she underwent vein stripping twice in the past. She occasionally experiences soreness and mild swelling in the affected (left) leg.  The patient's hypertension was previously managed with hydrochlorothiazide, which she stopped taking approximately three weeks ago under direction of cardiology. Since discontinuation, she reports her blood pressure readings have remained within normal limits.  The patient is due for a colonoscopy, having had one approximately ten years ago with no polyps found. She has a family history of precancerous polyps. She also reports her gynecologist recently retired, and she is seeking a new provider.  The patient's cholesterol levels were recently checked and found to be within normal limits. Her potassium level was slightly low at the time of her last blood work. She is scheduled  to return for additional blood work, including hormone level checks at the end of the week.  The patient is due for a flu vaccine but has decided to postpone it due to upcoming travel, she will have this completed at the end of the week when she has her labs drawn. She received the first dose of the COVID-19 vaccine but did not complete the series. She has no known drug allergies.  The patient's lifestyle includes frequent local travel for work. She expresses stress related to her work and upcoming travel plans. She denies any changes in bowel or bladder habits, and any issues with hearing or vision, apart from requiring reading glasses for close-up work.  Pertinent items are noted in HPI.  IMMUNIZATIONS:   Flu Vaccine: Flu vaccine declined, patient will complete later Prevnar 13: Prevnar 13 N/A for this patient Prevnar 20: Prevnar 20 N/A for this patient Pneumovax 23: Pneumovax 23 N/A for this patient Vac Shingrix: Shingrix declined, patient will complete at a later date HPV: N/A or Aged Out Tetanus: Tetanus completed in the last 10 years COVID: Declined, patient does not wish to have this vaccine RSV: No  HEALTH MAINTENANCE: Pap Smear HM Status: is due and will be scheduled by patient in the near future Mammogram HM Status: is up to date Colon Cancer Screening HM Status: was ordered today Bone Density HM Status: N/A STI Testing HM Status: was declined  Lung CT HM Status: N/A  Concerns with vision, hearing, or dentition: No  Most Recent Depression Screen:     07/05/2023   10:08 AM 05/17/2020    3:32 PM  06/09/2019   10:07 AM 06/05/2019    4:54 PM 03/27/2019    3:06 PM  Depression screen PHQ 2/9  Decreased Interest 0 0 0 0 0  Down, Depressed, Hopeless 0 0 0 0 0  PHQ - 2 Score 0 0 0 0 0   Most Recent Anxiety Screen:      No data to display         Most Recent Fall Screen:    07/05/2023   10:08 AM 05/17/2020    3:32 PM 06/09/2019   10:07 AM 06/05/2019    4:54 PM  03/27/2019    3:05 PM  Fall Risk   Falls in the past year? 0 0 0 0 0  Number falls in past yr: 0 0 0 0   Injury with Fall? 0 0 0 0 0  Risk for fall due to : No Fall Risks No Fall Risks     Follow up Falls evaluation completed Falls evaluation completed       Past medical history, surgical history, medications, allergies, family history and social history reviewed with patient today and changes made to appropriate areas of the chart.  Past Medical History:  Past Medical History:  Diagnosis Date   Allergy    Arthritis    Hypertension    Medications:  Current Outpatient Medications on File Prior to Visit  Medication Sig   atorvastatin (LIPITOR) 20 MG tablet Take 1 tablet (20 mg total) by mouth daily.   Probiotic Product (PROBIOTIC ADVANCED PO) Take by mouth.   No current facility-administered medications on file prior to visit.   Surgical History:  Past Surgical History:  Procedure Laterality Date   DILATION AND CURETTAGE OF UTERUS  2005, 2006   INGUINAL HERNIA REPAIR  1975   right   Allergies:  No Known Allergies Family History:  Family History  Problem Relation Age of Onset   Breast cancer Mother 49   Colon polyps Mother    Hypertension Mother    Colon polyps Father    Cancer Father        Prostate   Hypertension Father    Colon polyps Sister 48   Diabetes Paternal Grandmother    Stroke Paternal Grandfather    Colon polyps Brother 50   Hypertension Other        family hx   Cancer Other        prostate   Stomach cancer Neg Hx        Objective:    BP 126/82   Pulse 72   Ht 5\' 3"  (1.6 m)   Wt 121 lb (54.9 kg)   LMP 10/26/2015 (Approximate)   BMI 21.43 kg/m   Wt Readings from Last 3 Encounters:  07/05/23 121 lb (54.9 kg)  05/06/23 121 lb (54.9 kg)  03/02/22 119 lb 6.4 oz (54.2 kg)    Physical Exam Vitals and nursing note reviewed.  Constitutional:      General: She is not in acute distress.    Appearance: Normal appearance.  HENT:     Head:  Normocephalic and atraumatic.     Right Ear: Hearing, tympanic membrane, ear canal and external ear normal.     Left Ear: Hearing, tympanic membrane, ear canal and external ear normal.     Nose: Congestion present.     Right Sinus: No maxillary sinus tenderness or frontal sinus tenderness.     Left Sinus: No maxillary sinus tenderness or frontal sinus tenderness.     Mouth/Throat:  Lips: Pink.     Mouth: Mucous membranes are moist.     Pharynx: Oropharynx is clear. Postnasal drip present. No oropharyngeal exudate.     Tonsils: No tonsillar exudate.  Eyes:     General: Lids are normal. Vision grossly intact.     Extraocular Movements: Extraocular movements intact.     Conjunctiva/sclera: Conjunctivae normal.     Pupils: Pupils are equal, round, and reactive to light.     Funduscopic exam:    Right eye: Red reflex present.        Left eye: Red reflex present.    Visual Fields: Right eye visual fields normal and left eye visual fields normal.  Neck:     Thyroid: No thyromegaly.     Vascular: No carotid bruit.  Cardiovascular:     Rate and Rhythm: Normal rate and regular rhythm.     Chest Wall: PMI is not displaced.     Pulses: Normal pulses.          Dorsalis pedis pulses are 2+ on the right side and 2+ on the left side.       Posterior tibial pulses are 2+ on the right side and 2+ on the left side.     Heart sounds: Normal heart sounds. No murmur heard. Pulmonary:     Effort: Pulmonary effort is normal. No respiratory distress.     Breath sounds: Rhonchi present.  Abdominal:     General: Abdomen is flat. Bowel sounds are normal. There is no distension.     Palpations: Abdomen is soft. There is no hepatomegaly, splenomegaly or mass.     Tenderness: There is no abdominal tenderness. There is no right CVA tenderness, left CVA tenderness, guarding or rebound.  Musculoskeletal:        General: Normal range of motion.     Cervical back: Full passive range of motion without pain,  normal range of motion and neck supple. No tenderness.     Right lower leg: No edema.     Left lower leg: No edema.  Feet:     Left foot:     Toenail Condition: Left toenails are normal.  Lymphadenopathy:     Cervical: Cervical adenopathy present.     Right cervical: Superficial cervical adenopathy present.     Left cervical: Superficial cervical adenopathy present.     Upper Body:     Right upper body: No supraclavicular adenopathy.     Left upper body: No supraclavicular adenopathy.  Skin:    General: Skin is warm and dry.     Capillary Refill: Capillary refill takes less than 2 seconds.     Nails: There is no clubbing.  Neurological:     General: No focal deficit present.     Mental Status: She is alert and oriented to person, place, and time.     GCS: GCS eye subscore is 4. GCS verbal subscore is 5. GCS motor subscore is 6.     Sensory: Sensation is intact.     Motor: Motor function is intact.     Coordination: Coordination is intact.     Gait: Gait is intact.     Deep Tendon Reflexes: Reflexes are normal and symmetric.  Psychiatric:        Attention and Perception: Attention normal.        Mood and Affect: Mood normal.        Speech: Speech normal.        Behavior: Behavior normal. Behavior is  cooperative.        Thought Content: Thought content normal.        Cognition and Memory: Cognition and memory normal.        Judgment: Judgment normal.      Results for orders placed or performed in visit on 05/06/23  Basic metabolic panel  Result Value Ref Range   Glucose 99 70 - 99 mg/dL   BUN 18 6 - 24 mg/dL   Creatinine, Ser 4.25 0.57 - 1.00 mg/dL   eGFR 95 >95 GL/OVF/6.43   BUN/Creatinine Ratio 24 (H) 9 - 23   Sodium 143 134 - 144 mmol/L   Potassium 3.4 (L) 3.5 - 5.2 mmol/L   Chloride 102 96 - 106 mmol/L   CO2 30 (H) 20 - 29 mmol/L   Calcium 9.8 8.7 - 10.2 mg/dL  Lipid panel  Result Value Ref Range   Cholesterol, Total 159 100 - 199 mg/dL   Triglycerides 49 0 -  149 mg/dL   HDL 91 >32 mg/dL   VLDL Cholesterol Cal 11 5 - 40 mg/dL   LDL Chol Calc (NIH) 57 0 - 99 mg/dL   Chol/HDL Ratio 1.7 0.0 - 4.4 ratio       Assessment & Plan:   Problem List Items Addressed This Visit     Essential hypertension    Well controlled blood pressures since stopping hydrochlorothiazide about 2 weeks ago. I suspect this was a contributor to her potassium abnormality. Will repeat labs -Continue to monitor BP at home and report if readings go above 130/80 on a consistent basis      Relevant Orders   CBC with Differential/Platelet   CMP14+EGFR   Varicose veins of left lower extremity with complications   Annual physical exam - Primary    CPE completed today. Review of HM activities and recommendations discussed and provided on AVS. Anticipatory guidance, diet, and exercise recommendations provided. Medications, allergies, and hx reviewed and updated as necessary. Orders placed as listed below.  Plan: - Labs ordered. Will make changes as necessary based on results.  - I will review these results and send recommendations via MyChart or a telephone call.  - F/U with CPE in 1 year or sooner for acute/chronic health needs as directed.        Relevant Orders   CBC with Differential/Platelet   CMP14+EGFR   Acute non-recurrent maxillary sinusitis    Reports drainage, congestion, and chest congestion for a couple of weeks. No fever, chills, or wheezing. Mild congestion heard in lungs, right maxillary sinus tenderness. Likely a mild upper respiratory infection/sinusitis. Discussed azithromycin as a treatment option, noting it is generally well-tolerated with minimal side effects. Advised to monitor for signs of yeast infection and report if symptoms occur. - Prescribe azithromycin: 2 tablets on the first day, then 1 tablet daily for 4 days - Advise to monitor for signs of yeast infection and report if symptoms occur      Relevant Medications   azithromycin (ZITHROMAX)  250 MG tablet   Menopause syndrome    Experiencing mood swings, trouble sleeping, and has been without a period for several years, indicating post-menopausal status. Hormonal fluctuations likely contributing to symptoms. Discussed potential benefits of hormone level testing. Discussed trazodone for sleep issues, noting it is well-tolerated with minimal side effects and can be adjusted as needed. - Order hormone level blood work - Prescribe trazodone for sleep issues, starting with a low dose and adjusting as needed      Relevant  Orders   Estradiol   TSH + free T4   Progesterone   FSH/LH   Testosterone, Total, LC/MS/MS   Other Visit Diagnoses     Screening for colon cancer       Relevant Orders   Ambulatory referral to Gastroenterology   Papanicolaou smear for cervical cancer screening       Primary insomnia       Relevant Medications   traZODone (DESYREL) 50 MG tablet         Follow up plan: Return in about 4 days (around 07/09/2023) for Nurse Visit, Labs- needs flu vaccine and lab work.  NEXT PREVENTATIVE PHYSICAL DUE IN 1 YEAR.  PATIENT COUNSELING PROVIDED FOR ALL ADULT PATIENTS: A well balanced diet low in saturated fats, cholesterol, and moderation in carbohydrates.  This can be as simple as monitoring portion sizes and cutting back on sugary beverages such as soda and juice to start with.    Daily water consumption of at least 64 ounces.  Physical activity at least 180 minutes per week.  If just starting out, start 10 minutes a day and work your way up.   This can be as simple as taking the stairs instead of the elevator and walking 2-3 laps around the office  purposefully every day.   STD protection, partner selection, and regular testing if high risk.  Limited consumption of alcoholic beverages if alcohol is consumed. For men, I recommend no more than 14 alcoholic beverages per week, spread out throughout the week (max 2 per day). Avoid "binge" drinking or  consuming large quantities of alcohol in one setting.  Please let me know if you feel you may need help with reduction or quitting alcohol consumption.   Avoidance of nicotine, if used. Please let me know if you feel you may need help with reduction or quitting nicotine use.   Daily mental health attention. This can be in the form of 5 minute daily meditation, prayer, journaling, yoga, reflection, etc.  Purposeful attention to your emotions and mental state can significantly improve your overall wellbeing  and  Health.  Please know that I am here to help you with all of your health care goals and am happy to work with you to find a solution that works best for you.  The greatest advice I have received with any changes in life are to take it one step at a time, that even means if all you can focus on is the next 60 seconds, then do that and celebrate your victories.  With any changes in life, you will have set backs, and that is OK. The important thing to remember is, if you have a set back, it is not a failure, it is an opportunity to try again! Screening Testing Mammogram Every 1 -2 years based on history and risk factors Starting at age 58 Pap Smear Ages 21-39 every 3 years Ages 58-65 every 5 years with HPV testing More frequent testing may be required based on results and history Colon Cancer Screening Every 1-10 years based on test performed, risk factors, and history Starting at age 42 Bone Density Screening Every 2-10 years based on history Starting at age 15 for women Recommendations for men differ based on medication usage, history, and risk factors AAA Screening One time ultrasound Men 49-34 years old who have every smoked Lung Cancer Screening Low Dose Lung CT every 12 months Age 20-80 years with a 30 pack-year smoking history who still smoke or who have  quit within the last 15 years   Screening Labs Routine  Labs: Complete Blood Count (CBC), Complete Metabolic Panel  (CMP), Cholesterol (Lipid Panel) Every 6-12 months based on history and medications May be recommended more frequently based on current conditions or previous results Hemoglobin A1c Lab Every 3-12 months based on history and previous results Starting at age 39 or earlier with diagnosis of diabetes, high cholesterol, BMI >26, and/or risk factors Frequent monitoring for patients with diabetes to ensure blood sugar control Thyroid Panel (TSH) Every 6 months based on history, symptoms, and risk factors May be repeated more often if on medication HIV One time testing for all patients 47 and older May be repeated more frequently for patients with increased risk factors or exposure Hepatitis C One time testing for all patients 63 and older May be repeated more frequently for patients with increased risk factors or exposure Gonorrhea, Chlamydia Every 12 months for all sexually active persons 13-24 years Additional monitoring may be recommended for those who are considered high risk or who have symptoms Every 12 months for any woman on birth control, regardless of sexual activity PSA Men 38-64 years old with risk factors Additional screening may be recommended from age 57-69 based on risk factors, symptoms, and history  Vaccine Recommendations Tetanus Booster All adults every 10 years Flu Vaccine All patients 6 months and older every year COVID Vaccine All patients 12 years and older Initial dosing with booster May recommend additional booster based on age and health history HPV Vaccine 2 doses all patients age 50-26 Dosing may be considered for patients over 26 Shingles Vaccine (Shingrix) 2 doses all adults 55 years and older Pneumonia (Pneumovax 23) All adults 65 years and older May recommend earlier dosing based on health history One year apart from Prevnar 13 Pneumonia (Prevnar 78) All adults 65 years and older Dosed 1 year after Pneumovax 23 Pneumonia (Prevnar 20) One time  alternative to the two dosing of 13 and 23 For all adults with initial dose of 23, 20 is recommended 1 year later For all adults with initial dose of 13, 23 is still recommended as second option 1 year later

## 2023-07-05 NOTE — Assessment & Plan Note (Signed)
Reports drainage, congestion, and chest congestion for a couple of weeks. No fever, chills, or wheezing. Mild congestion heard in lungs, right maxillary sinus tenderness. Likely a mild upper respiratory infection/sinusitis. Discussed azithromycin as a treatment option, noting it is generally well-tolerated with minimal side effects. Advised to monitor for signs of yeast infection and report if symptoms occur. - Prescribe azithromycin: 2 tablets on the first day, then 1 tablet daily for 4 days - Advise to monitor for signs of yeast infection and report if symptoms occur

## 2023-07-09 ENCOUNTER — Other Ambulatory Visit: Payer: 59

## 2023-07-26 ENCOUNTER — Other Ambulatory Visit (INDEPENDENT_AMBULATORY_CARE_PROVIDER_SITE_OTHER): Payer: 59

## 2023-07-26 DIAGNOSIS — N951 Menopausal and female climacteric states: Secondary | ICD-10-CM

## 2023-07-26 DIAGNOSIS — Z0000 Encounter for general adult medical examination without abnormal findings: Secondary | ICD-10-CM

## 2023-07-26 DIAGNOSIS — I10 Essential (primary) hypertension: Secondary | ICD-10-CM

## 2023-07-26 DIAGNOSIS — Z23 Encounter for immunization: Secondary | ICD-10-CM

## 2023-07-28 LAB — CMP14+EGFR
ALT: 39 [IU]/L — ABNORMAL HIGH (ref 0–32)
AST: 39 [IU]/L (ref 0–40)
Albumin: 4.2 g/dL (ref 3.8–4.9)
Alkaline Phosphatase: 51 [IU]/L (ref 44–121)
BUN/Creatinine Ratio: 26 — ABNORMAL HIGH (ref 9–23)
BUN: 22 mg/dL (ref 6–24)
Bilirubin Total: 0.4 mg/dL (ref 0.0–1.2)
CO2: 24 mmol/L (ref 20–29)
Calcium: 9.5 mg/dL (ref 8.7–10.2)
Chloride: 104 mmol/L (ref 96–106)
Creatinine, Ser: 0.86 mg/dL (ref 0.57–1.00)
Globulin, Total: 2.2 g/dL (ref 1.5–4.5)
Glucose: 92 mg/dL (ref 70–99)
Potassium: 4.1 mmol/L (ref 3.5–5.2)
Sodium: 141 mmol/L (ref 134–144)
Total Protein: 6.4 g/dL (ref 6.0–8.5)
eGFR: 79 mL/min/{1.73_m2} (ref >59)

## 2023-07-28 LAB — CBC WITH DIFFERENTIAL/PLATELET
Basophils Absolute: 0 10*3/uL (ref 0.0–0.2)
Basos: 1 %
EOS (ABSOLUTE): 0.2 10*3/uL (ref 0.0–0.4)
Eos: 6 %
Hematocrit: 40.1 % (ref 34.0–46.6)
Hemoglobin: 13 g/dL (ref 11.1–15.9)
Immature Grans (Abs): 0 10*3/uL (ref 0.0–0.1)
Immature Granulocytes: 0 %
Lymphocytes Absolute: 1.1 10*3/uL (ref 0.7–3.1)
Lymphs: 29 %
MCH: 29.7 pg (ref 26.6–33.0)
MCHC: 32.4 g/dL (ref 31.5–35.7)
MCV: 92 fL (ref 79–97)
Monocytes Absolute: 0.3 10*3/uL (ref 0.1–0.9)
Monocytes: 9 %
Neutrophils Absolute: 2.2 10*3/uL (ref 1.4–7.0)
Neutrophils: 55 %
Platelets: 277 10*3/uL (ref 150–450)
RBC: 4.38 x10E6/uL (ref 3.77–5.28)
RDW: 13.1 % (ref 11.7–15.4)
WBC: 3.9 10*3/uL (ref 3.4–10.8)

## 2023-07-28 LAB — PROGESTERONE: Progesterone: 0.1 ng/mL

## 2023-07-28 LAB — TESTOSTERONE, TOTAL, LC/MS/MS: Testosterone, total: 37.5 ng/dL

## 2023-07-28 LAB — FSH/LH
FSH: 113 m[IU]/mL
LH: 42.7 m[IU]/mL

## 2023-07-28 LAB — ESTRADIOL

## 2023-07-28 LAB — TSH+FREE T4
Free T4: 0.98 ng/dL (ref 0.82–1.77)
TSH: 1.48 u[IU]/mL (ref 0.450–4.500)

## 2023-08-18 ENCOUNTER — Other Ambulatory Visit: Payer: Self-pay | Admitting: Cardiology

## 2023-09-30 ENCOUNTER — Encounter: Payer: Self-pay | Admitting: Nurse Practitioner

## 2023-10-08 ENCOUNTER — Encounter: Payer: Self-pay | Admitting: Pediatrics

## 2023-10-18 ENCOUNTER — Other Ambulatory Visit: Payer: Self-pay | Admitting: Nurse Practitioner

## 2023-10-18 DIAGNOSIS — Z1231 Encounter for screening mammogram for malignant neoplasm of breast: Secondary | ICD-10-CM

## 2023-11-15 ENCOUNTER — Telehealth: Payer: Self-pay

## 2023-11-15 ENCOUNTER — Ambulatory Visit (AMBULATORY_SURGERY_CENTER)

## 2023-11-15 VITALS — Ht 63.0 in | Wt 120.0 lb

## 2023-11-15 DIAGNOSIS — Z1211 Encounter for screening for malignant neoplasm of colon: Secondary | ICD-10-CM

## 2023-11-15 MED ORDER — NA SULFATE-K SULFATE-MG SULF 17.5-3.13-1.6 GM/177ML PO SOLN: 1.0000 | Freq: Once | ORAL | 0 refills | Status: AC

## 2023-11-15 NOTE — Progress Notes (Signed)

## 2023-11-15 NOTE — Telephone Encounter (Signed)
 PV in progress

## 2023-11-19 ENCOUNTER — Encounter: Payer: Self-pay | Admitting: Pediatrics

## 2023-11-28 NOTE — Progress Notes (Unsigned)
 [facility] History and Physical   Primary Care Physician:  Early, Adriane Albe, NP   Reason for Procedure:  Colorectal cancer screening; family history of colon polyps and multiple first-degree relatives  Plan:    Screening colonoscopy     HPI: Grace [name] is a 58 y.o. female undergoing screening colonoscopy for colorectal cancer screening.  Patient's last colonoscopy was performed in 2013 and was normal.  There is a pertinent family history of colorectal cancer in multiple first-degree relatives-brother, sister and father.  No colorectal cancer.  Patient denies current symptoms of change in bowel habits or rectal bleeding.   Past Medical History:  Diagnosis Date   Allergy    Arthritis    Hypertension     Past Surgical History:  Procedure Laterality Date   DILATION AND CURETTAGE OF UTERUS  2005, 2006   INGUINAL HERNIA REPAIR  1975   right    Prior to Admission medications   Medication Sig Start Date End Date Taking? Authorizing Provider  atorvastatin  (LIPITOR) 20 MG tablet TAKE 1 TABLET(20 MG) BY MOUTH DAILY 08/19/23   Wendie Hamburg, MD  Probiotic Product (PROBIOTIC ADVANCED PO) Take by mouth. Patient not taking: Reported on 11/15/2023    [provider]  traZODone  (DESYREL ) 50 MG tablet Take 0.5-2 tablets (25-100 mg total) by mouth at bedtime as needed for sleep. Patient not taking: Reported on 11/15/2023 07/05/23   Annella Kief, NP    Current Outpatient Medications  Medication Sig Dispense Refill   atorvastatin  (LIPITOR) 20 MG tablet TAKE 1 TABLET(20 MG) BY MOUTH DAILY 90 tablet 2   Probiotic Product (PROBIOTIC ADVANCED PO) Take by mouth. (Patient not taking: Reported on 11/15/2023)     traZODone  (DESYREL ) 50 MG tablet Take 0.5-2 tablets (25-100 mg total) by mouth at bedtime as needed for sleep. (Patient not taking: Reported on 11/15/2023) 60 tablet 6   No current facility-administered medications for this visit.    Allergies as of 11/29/2023    (No Known Allergies)    Family History  Problem Relation Age of Onset   Breast cancer Mother 29   Colon polyps Mother    Hypertension Mother    Colon polyps Father    Cancer Father        Prostate   Hypertension Father    Colon polyps Sister 62   Colon polyps Brother 42   Diabetes Paternal Grandmother    Stroke Paternal Grandfather    Hypertension Other        family hx   Cancer Other        prostate   Stomach cancer Neg Hx    Colon cancer Neg Hx    Rectal cancer Neg Hx     Social History   Socioeconomic History   Marital status: Married    Spouse name: Not on file   Number of children: Not on file   Years of education: Not on file   Highest education level: Not on file  Occupational History   Not on file  Tobacco Use   Smoking status: Former    Current packs/day: 0.00    Types: Cigarettes    Quit date: 01/21/1997    Years since quitting: 26.8   Smokeless tobacco: Never  Vaping Use   Vaping status: Never Used  Substance and Sexual Activity   Alcohol use: Yes    Alcohol/week: 2.0 standard drinks of alcohol    Types: 2 Glasses of wine per week   Drug use: No  Sexual activity: Yes    Partners: Male    Birth control/protection: Post-menopausal  Other Topics Concern   Not on file  Social History Narrative   Not on file   Social Drivers of Health   Financial Resource Strain: Low Risk  (06/09/2019)   Overall Financial Resource Strain (CARDIA)    Difficulty of Paying Living Expenses: Not hard at all  Food Insecurity: No Food Insecurity (06/09/2019)   Hunger Vital Sign    Worried About Running Out of Food in the Last Year: Never true    Ran Out of Food in the Last Year: Never true  Transportation Needs: No Transportation Needs (06/09/2019)   PRAPARE - Administrator, Civil Service (Medical): No    Lack of Transportation (Non-Medical): No  Physical Activity: Not on file  Stress: Not on file  Social Connections: Not on file  Intimate Partner  Violence: Not on file    Review of Systems:  All other review of systems negative except as mentioned in the HPI.  Physical Exam: Vital signs LMP 10/26/2015 (Approximate)   General:   Alert,  Well-developed, well-nourished, pleasant and cooperative in NAD Airway:  Mallampati  Lungs:  Clear throughout to auscultation.   Heart:  Regular rate and rhythm; no murmurs, clicks, rubs,  or gallops. Abdomen:  Soft, nontender and nondistended. Normal bowel sounds.   Neuro/Psych:  Normal mood and affect. A and O x 3  Eugenia Hess, MD [facility]

## 2023-11-29 ENCOUNTER — Ambulatory Visit (AMBULATORY_SURGERY_CENTER): Admitting: Pediatrics

## 2023-11-29 ENCOUNTER — Encounter: Payer: Self-pay | Admitting: Pediatrics

## 2023-11-29 VITALS — BP 119/74 | HR 66 | Temp 98.4°F | Resp 10 | Ht 63.0 in | Wt 120.0 lb

## 2023-11-29 DIAGNOSIS — K648 Other hemorrhoids: Secondary | ICD-10-CM | POA: Diagnosis not present

## 2023-11-29 DIAGNOSIS — K644 Residual hemorrhoidal skin tags: Secondary | ICD-10-CM

## 2023-11-29 DIAGNOSIS — Z1211 Encounter for screening for malignant neoplasm of colon: Secondary | ICD-10-CM | POA: Diagnosis present

## 2023-11-29 DIAGNOSIS — Z83719 Family history of colon polyps, unspecified: Secondary | ICD-10-CM

## 2023-11-29 MED ORDER — SODIUM CHLORIDE 0.9 % IV SOLN: 500.0000 mL | Freq: Once | INTRAVENOUS | Status: DC

## 2023-11-29 NOTE — Patient Instructions (Signed)

## 2023-11-29 NOTE — Progress Notes (Signed)
 Pt's states no medical or surgical changes since previsit or office visit.

## 2023-11-29 NOTE — Op Note (Signed)
 [facility] Patient Name: Grace [name] Procedure Date: 11/29/2023 9:55 AM MRN: [id number] Endoscopist: Eugenia Hess , MD, [id number] Age: 58 Referring MD:  Date of Birth: Feb 12, 1966 Gender: Female Account #: [id number] Procedure:                Colonoscopy Indications:              Colon cancer screening in patient at increased                            risk: Family history of colon polyps in multiple                            1st-degree relatives, Last colonoscopy: 2013 Medicines:                Monitored Anesthesia Care Procedure:                Pre-Anesthesia Assessment:                           - Prior to the procedure, a History and Physical                            was performed, and patient medications and                            allergies were reviewed. The patient's tolerance of                            previous anesthesia was also reviewed. The risks                            and benefits of the procedure and the sedation                            options and risks were discussed with the patient.                            All questions were answered, and informed consent                            was obtained. Prior Anticoagulants: The patient has                            taken no anticoagulant or antiplatelet agents. ASA                            Grade Assessment: II - A patient with mild systemic                            disease. After reviewing the risks and benefits,                            the patient was deemed in satisfactory condition to  undergo the procedure.                           After obtaining informed consent, the pediatric                            colonoscope was passed under direct vision.                            Throughout the procedure, the patient's blood                            pressure, pulse, and oxygen saturations were                            monitored continuously. The Olympus  Scope SN:                            [id number] was introduced through the anus and                            advanced to the cecum, identified by appendiceal                            orifice and ileocecal valve. The colonoscopy was                            performed without difficulty. The patient tolerated                            the procedure well. The quality of the bowel                            preparation was good. The ileocecal valve,                            appendiceal orifice, and rectum were photographed. Scope In: 10:09:06 AM Scope Out: 10:22:00 AM Scope Withdrawal Time: 0 hours 9 minutes 19 seconds  Total Procedure Duration: 0 hours 12 minutes 54 seconds  Findings:                 Hemorrhoids were found on perianal exam.                           The colon (entire examined portion) appeared normal.                           Internal hemorrhoids were found during retroflexion. Complications:            No immediate complications. Estimated blood loss:                            None. Estimated Blood Loss:     Estimated blood loss: none. Impression:               - Hemorrhoids found on perianal exam.                           -  The entire examined colon is normal.                           - Internal hemorrhoids.                           - No specimens collected. Recommendation:           - Discharge patient to home (ambulatory).                           - Repeat colonoscopy in 10 years for screening                            purposes.                           - The findings and recommendations were discussed                            with the patient's family.                           - Return to referring physician.                           - Patient has a contact number available for                            emergencies. The signs and symptoms of potential                            delayed complications were discussed with the                             patient. Return to normal activities tomorrow.                            Written discharge instructions were provided to the                            patient. Eugenia Hess, MD 11/29/2023 10:29:33 AM This report has been signed electronically.

## 2023-11-29 NOTE — Progress Notes (Signed)
 Report given to PACU, vss

## 2023-11-30 ENCOUNTER — Telehealth: Payer: Self-pay

## 2023-11-30 NOTE — Telephone Encounter (Signed)
 Left message on follow up call.

## 2023-12-01 ENCOUNTER — Ambulatory Visit
Admission: RE | Admit: 2023-12-01 | Discharge: 2023-12-01 | Disposition: A | Discharge status: Home / Self Care | Source: Ambulatory Visit | Attending: Nurse Practitioner

## 2023-12-01 DIAGNOSIS — Z1231 Encounter for screening mammogram for malignant neoplasm of breast: Secondary | ICD-10-CM

## 2023-12-02 ENCOUNTER — Encounter: Payer: Self-pay | Admitting: Nurse Practitioner

## 2024-06-27 ENCOUNTER — Other Ambulatory Visit: Payer: Self-pay | Admitting: Cardiology

## 2024-06-28 NOTE — Progress Notes (Signed)
 Catheline Doing, DNP, AGNP-c [facility] [address] [place], [place] [address] Main Office [phone] VISIT TYPE: CPE on 07/06/2024 Today's Vitals   07/06/24 0940  BP: 112/72  Pulse: 68  Weight: 126 lb 9.6 oz (57.4 kg)  Height: 5' 2.5 (1.588 m)   Body mass index is 22.79 kg/m.  Wt Readings from Last 3 Encounters:  07/06/24 126 lb 9.6 oz (57.4 kg)  11/29/23 120 lb (54.4 kg)  11/15/23 120 lb (54.4 kg)     Subjective:  Annual Exam (CPE, fasting labs, has obgyn but has not scheduled with them since last obgyn retired, [employer] [facility] obgyn she thinks, )  History of Present Illness Grace [name] is a 58 year old female who presents for her annual physical exam.  She feels like she is developing an illness, with symptoms beginning in the head, including a sore throat and sinus issues, which then progress to the chest.  She experiences menopausal symptoms such as insomnia, cognitive difficulties, lack of focus, and fatigue. She wants to lose weight despite maintaining a healthy diet and acknowledges a decrease in physical activity. She previously tried trazodone  for sleep, which caused adverse effects, and has used sleep gummies in the past. She is considering magnesium glycinate for sleep.  She has dense breasts and inquires about the necessity of additional ultrasounds with her annual mammogram. She notes that insurance does not cover routine ultrasounds unless changes are detected.  She recently experienced knee pain, evaluated by [facility], where Ithzel Fedorchak arthritis was suspected. The pain, located on the side of her knee, affected her ability to walk but has since resolved. She continued working during this period.  She reports dry, scaly skin on her hands and arms, which has improved. She suspects it may be related to weather changes or irritants like soap or lotion.  No changes in bowel or bladder habits, hearing, or vision concerns, though she needs to  schedule an eye appointment. No shortness of breath, palpitations, or swelling in her feet or ankles.  Pertinent items are noted in HPI.  Flu: not today Covid: not today,  Pneumonia: not today  Shingles: not today     07/06/2024    9:40 AM 07/05/2023   10:08 AM 05/17/2020    3:32 PM 06/09/2019   10:07 AM 06/05/2019    4:54 PM  Depression screen PHQ 2/9  Decreased Interest 0 0 0 0 0  Down, Depressed, Hopeless 0 0 0 0 0  PHQ - 2 Score 0 0 0 0 0        No data to display             07/06/2024    9:40 AM 07/05/2023   10:08 AM 05/17/2020    3:32 PM 06/09/2019   10:07 AM 06/05/2019    4:54 PM  Fall Risk   Falls in the past year? 0 0 0 0  0   Number falls in past yr: 0 0 0 0  0   Injury with Fall? 0 0  0  0  0   Risk for fall due to : No Fall Risks No Fall Risks No Fall Risks    Follow up Falls evaluation completed Falls evaluation completed Falls evaluation completed        Data saved with a previous flowsheet row definition   Past medical history, surgical history, medications, allergies, family history and social history reviewed with patient today and changes made to appropriate areas of the chart.  Objective:    Physical Exam Vitals and nursing note reviewed.  Constitutional:      General: She is not in acute distress.    Appearance: Normal appearance. She is normal weight.  HENT:     Head: Normocephalic.     Right Ear: Hearing, tympanic membrane, ear canal and external ear normal.     Left Ear: Hearing, tympanic membrane, ear canal and external ear normal.     Nose: Congestion and rhinorrhea present.     Right Sinus: Maxillary sinus tenderness and frontal sinus tenderness present.     Left Sinus: Maxillary sinus tenderness and frontal sinus tenderness present.     Mouth/Throat:     Lips: Pink.     Mouth: Mucous membranes are moist.     Pharynx: Posterior oropharyngeal erythema and postnasal drip present.  Eyes:     General: Lids are normal. Vision  grossly intact.     Extraocular Movements: Extraocular movements intact.     Conjunctiva/sclera: Conjunctivae normal.     Pupils: Pupils are equal, round, and reactive to light.     Funduscopic exam:    Right eye: Red reflex present.        Left eye: Red reflex present.    Visual Fields: Right eye visual fields normal and left eye visual fields normal.  Neck:     Thyroid : No thyromegaly.     Vascular: No carotid bruit.  Cardiovascular:     Rate and Rhythm: Normal rate and regular rhythm.     Chest Wall: PMI is not displaced.     Pulses: Normal pulses.          Dorsalis pedis pulses are 2+ on the right side and 2+ on the left side.       Posterior tibial pulses are 2+ on the right side and 2+ on the left side.     Heart sounds: Normal heart sounds. No murmur heard. Pulmonary:     Effort: Pulmonary effort is normal. No respiratory distress.     Breath sounds: Normal breath sounds.  Abdominal:     General: Abdomen is flat. Bowel sounds are normal. There is no distension.     Palpations: Abdomen is soft. There is no hepatomegaly, splenomegaly or mass.     Tenderness: There is no abdominal tenderness. There is no right CVA tenderness, left CVA tenderness, guarding or rebound.  Musculoskeletal:        General: Normal range of motion.     Cervical back: Full passive range of motion without pain and normal range of motion. No tenderness.     Right lower leg: No edema.     Left lower leg: No edema.  Feet:     Left foot:     Toenail Condition: Left toenails are normal.  Lymphadenopathy:     Cervical: No cervical adenopathy.     Upper Body:     Right upper body: No supraclavicular adenopathy.     Left upper body: No supraclavicular adenopathy.  Skin:    General: Skin is warm and dry.     Capillary Refill: Capillary refill takes less than 2 seconds.     Nails: There is no clubbing.  Neurological:     General: No focal deficit present.     Mental Status: She is alert and oriented to  person, place, and time.     GCS: GCS eye subscore is 4. GCS verbal subscore is 5. GCS motor subscore is 6.  Sensory: Sensation is intact. No sensory deficit.     Motor: Motor function is intact. No weakness.     Coordination: Coordination is intact.     Gait: Gait is intact.     Deep Tendon Reflexes: Reflexes are normal and symmetric.  Psychiatric:        Attention and Perception: Attention normal.        Mood and Affect: Mood normal.        Speech: Speech normal.        Behavior: Behavior normal. Behavior is cooperative.        Cognition and Memory: Cognition and memory normal.         Assessment & Plan:   Assessment & Plan Encounter for annual physical exam CPE completed today. Review of HM activities and recommendations discussed and provided on AVS. Anticipatory guidance, diet, and exercise recommendations provided. Medications, allergies, and hx reviewed and updated as necessary. Orders placed as listed below.  Plan: - Labs ordered. Will make changes as necessary based on results.  - I will review these results and send recommendations via MyChart or a telephone call.  - F/U with CPE in 1 year or sooner for acute/chronic health needs as directed.      Atopic dermatitis of both hands Dry, scaly skin on hands and arms, likely exacerbated by weather changes or irritants. Symptoms have improved with over-the-counter treatments. - Continue over-the-counter hydrocortisone cream and Aquaphor for symptom relief. - Use stretchy gloves with Aquaphor overnight if symptoms persist.    Menopause syndrome Symptoms include insomnia, cognitive impairment, and fatigue. Hormonal levels confirm postmenopausal status. Discussed potential benefits and risks of hormone therapy, including osteoporosis and cardiovascular benefits versus thromboembolism and breast cancer risks. She is not currently interested in hormone therapy. - Consider magnesium glycinate for sleep improvement. - Provided  educational materials on menopause and hormone therapy.    Mixed hyperlipidemia Labs monitored today. Previously well controlled on atorvastatin  10mg  daily. Cardiology has been prescribing therapy, will forward lab results to Dr Kate for his records. - Continue with atrovastatin daily - Continue to eat healthy and exercise routinely.  Orders:   CBC with Differential/Platelet   CMP14+EGFR   Lipid panel  Acute non-recurrent pansinusitis Symptoms include headache, sore throat, and sinus congestion, consistent with a viral upper respiratory infection/sinusitis. Discussed potential progression to chest involvement based on viral infection goig and the use of azithromycin  if symptoms worsen. - Prescribed azithromycin  to be started if symptoms progress to chest involvement after five days. Orders:   azithromycin  (ZITHROMAX ) 250 MG tablet; Take 2 tablets on day 1, then 1 tablet daily on days 2 through 5  NEXT PREVENTATIVE PHYSICAL DUE IN 1 YEAR.      PATIENT COUNSELING PROVIDED FOR ALL ADULT PATIENTS: A well balanced diet low in saturated fats, cholesterol, and moderation in carbohydrates.  This can be as simple as monitoring portion sizes and cutting back on sugary beverages such as soda and juice to start with.    Daily water consumption of at least 64 ounces.  Physical activity at least 180 minutes per week.  If just starting out, start 10 minutes a day and work your way up.   This can be as simple as taking the stairs instead of the elevator and walking 2-3 laps around the office  purposefully every day.   STD protection, partner selection, and regular testing if high risk.  Limited consumption of alcoholic beverages if alcohol is consumed. For men, I recommend no more  than 14 alcoholic beverages per week, spread out throughout the week (max 2 per day). Avoid binge drinking or consuming large quantities of alcohol in one setting.  Please let me know if you feel you may need  help with reduction or quitting alcohol consumption.   Avoidance of nicotine, if used. Please let me know if you feel you may need help with reduction or quitting nicotine use.   Daily mental health attention. This can be in the form of 5 minute daily meditation, prayer, journaling, yoga, reflection, etc.  Purposeful attention to your emotions and mental state can significantly improve your overall wellbeing  and  Health.  Please know that I am here to help you with all of your health care goals and am happy to work with you to find a solution that works best for you.  The greatest advice I have received with any changes in life are to take it one step at a time, that even means if all you can focus on is the next 60 seconds, then do that and celebrate your victories.  With any changes in life, you will have set backs, and that is OK. The important thing to remember is, if you have a set back, it is not a failure, it is an opportunity to try again! Screening Testing Mammogram Every 1 -2 years based on history and risk factors Starting at age 45 Pap Smear Ages 21-39 every 3 years Ages 69-65 every 5 years with HPV testing More frequent testing may be required based on results and history Colon Cancer Screening Every 1-10 years based on test performed, risk factors, and history Starting at age 77 Bone Density Screening Every 2-10 years based on history Starting at age 30 for women Recommendations for men differ based on medication usage, history, and risk factors AAA Screening One time ultrasound Men 76-41 years old who have every smoked Lung Cancer Screening Low Dose Lung CT every 12 months Age 81-80 years with a 30 pack-year smoking history who still smoke or who have quit within the last 15 years   Screening Labs Routine  Labs: Complete Blood Count (CBC), Complete Metabolic Panel (CMP), Cholesterol (Lipid Panel) Every 6-12 months based on history and medications May be  recommended more frequently based on current conditions or previous results Hemoglobin A1c Lab Every 3-12 months based on history and previous results Starting at age 11 or earlier with diagnosis of diabetes, high cholesterol, BMI >26, and/or risk factors Frequent monitoring for patients with diabetes to ensure blood sugar control Thyroid  Panel (TSH) Every 6 months based on history, symptoms, and risk factors May be repeated more often if on medication HIV One time testing for all patients 2 and older May be repeated more frequently for patients with increased risk factors or exposure Hepatitis C One time testing for all patients 27 and older May be repeated more frequently for patients with increased risk factors or exposure Gonorrhea, Chlamydia Every 12 months for all sexually active persons 13-24 years Additional monitoring may be recommended for those who are considered high risk or who have symptoms Every 12 months for any woman on birth control, regardless of sexual activity PSA Men 91-42 years old with risk factors Additional screening may be recommended from age 36-69 based on risk factors, symptoms, and history  Vaccine Recommendations Tetanus Booster All adults every 10 years Flu Vaccine All patients 6 months and older every year COVID Vaccine All patients 12 years and older Initial dosing with booster  May recommend additional booster based on age and health history HPV Vaccine 2 doses all patients age 68-26 Dosing may be considered for patients over 26 Shingles Vaccine (Shingrix) 2 doses all adults 55 years and older Pneumonia (Pneumovax 8) All adults 65 years and older May recommend earlier dosing based on health history One year apart from Prevnar 13 Pneumonia (Prevnar 76) All adults 65 years and older Dosed 1 year after Pneumovax 23 Pneumonia (Prevnar 20) One time alternative to the two dosing of 13 and 23 For all adults with initial dose of 23, 20 is  recommended 1 year later For all adults with initial dose of 13, 23 is still recommended as second option 1 year later

## 2024-07-03 ENCOUNTER — Other Ambulatory Visit: Payer: Self-pay | Admitting: Cardiology

## 2024-07-06 ENCOUNTER — Ambulatory Visit: Payer: 59 | Admitting: Nurse Practitioner

## 2024-07-06 ENCOUNTER — Encounter: Payer: Self-pay | Admitting: Nurse Practitioner

## 2024-07-06 VITALS — BP 112/72 | HR 68 | Ht 62.5 in | Wt 126.6 lb

## 2024-07-06 DIAGNOSIS — E782 Mixed hyperlipidemia: Secondary | ICD-10-CM | POA: Diagnosis not present

## 2024-07-06 DIAGNOSIS — Z0000 Encounter for general adult medical examination without abnormal findings: Secondary | ICD-10-CM

## 2024-07-06 DIAGNOSIS — L209 Atopic dermatitis, unspecified: Secondary | ICD-10-CM

## 2024-07-06 DIAGNOSIS — N951 Menopausal and female climacteric states: Secondary | ICD-10-CM

## 2024-07-06 DIAGNOSIS — J0140 Acute pansinusitis, unspecified: Secondary | ICD-10-CM

## 2024-07-06 MED ORDER — AZITHROMYCIN 250 MG PO TABS: ORAL_TABLET | ORAL | 0 refills | Status: AC

## 2024-07-06 NOTE — Assessment & Plan Note (Signed)
 Symptoms include insomnia, cognitive impairment, and fatigue. Hormonal levels confirm postmenopausal status. Discussed potential benefits and risks of hormone therapy, including osteoporosis and cardiovascular benefits versus thromboembolism and breast cancer risks. She is not currently interested in hormone therapy. - Consider magnesium glycinate for sleep improvement. - Provided educational materials on menopause and hormone therapy.

## 2024-07-06 NOTE — Assessment & Plan Note (Signed)
 Labs monitored today. Previously well controlled on atorvastatin  10mg  daily. Cardiology has been prescribing therapy, will forward lab results to Dr Kate for his records. - Continue with atrovastatin daily - Continue to eat healthy and exercise routinely.  Orders:   CBC with Differential/Platelet   CMP14+EGFR   Lipid panel

## 2024-07-06 NOTE — Patient Instructions (Addendum)
 You can try Magnesium Glyconate 200-500mg  at bedtime to see if this is helpful for sleep.   I put some educational information on menopause and hormone replacement for you on the back of the paperwork, just for additional knowledge.   For your hands you can try hydrocortisone cream and aquaphor (applying a thin layer before bed and wearing cotton socks or gloves on the hands can help it stay in place) can help with the rash. If this is not helpful or if it gets worse, please let me know.   For all adult patients, I recommend A well balanced diet low in saturated fats, cholesterol, and moderation in carbohydrates.   This can be as simple as monitoring portion sizes and cutting back on sugary beverages such as soda and juice to start with.    Daily water consumption of at least 64 ounces.  Physical activity at least 180 minutes per week, if just starting out.   This can be as simple as taking the stairs instead of the elevator and walking 2-3 laps around the office  purposefully every day.   STD protection, partner selection, and regular testing if high risk.  Limited consumption of alcoholic beverages if alcohol is consumed.  For women, I recommend no more than 7 alcoholic beverages per week, spread out throughout the week.  Avoid binge drinking or consuming large quantities of alcohol in one setting.   Please let me know if you feel you may need help with reduction or quitting alcohol consumption.   Avoidance of nicotine, if used.  Please let me know if you feel you may need help with reduction or quitting nicotine use.   Daily mental health attention.  This can be in the form of 5 minute daily meditation, prayer, journaling, yoga, reflection, etc.   Purposeful attention to your emotions and mental state can significantly improve your overall wellbeing  and  Health.  Please know that I am here to help you with all of your health care goals and am happy to work with you to find a  solution that works best for you.  The greatest advice I have received with any changes in life are to take it one step at a time, that even means if all you can focus on is the next 60 seconds, then do that and celebrate your victories.  With any changes in life, you will have set backs, and that is OK. The important thing to remember is, if you have a set back, it is not a failure, it is an opportunity to try again!  Health Maintenance Recommendations Screening Testing Mammogram Every 1 -2 years based on history and risk factors Starting at age 87 Pap Smear Ages 21-39 every 3 years Ages 20-65 every 5 years with HPV testing More frequent testing may be required based on results and history Colon Cancer Screening Every 1-10 years based on test performed, risk factors, and history Starting at age 27 Bone Density Screening Every 2-10 years based on history Starting at age 77 for women Recommendations for men differ based on medication usage, history, and risk factors AAA Screening One time ultrasound Men 8-54 years old who have every smoked Lung Cancer Screening Low Dose Lung CT every 12 months Age 56-80 years with a 30 pack-year smoking history who still smoke or who have quit within the last 15 years  Screening Labs Routine  Labs: Complete Blood Count (CBC), Complete Metabolic Panel (CMP), Cholesterol (Lipid Panel) Every 6-12 months based  on history and medications May be recommended more frequently based on current conditions or previous results Hemoglobin A1c Lab Every 3-12 months based on history and previous results Starting at age 26 or earlier with diagnosis of diabetes, high cholesterol, BMI >26, and/or risk factors Frequent monitoring for patients with diabetes to ensure blood sugar control Thyroid  Panel (TSH w/ T3 & T4) Every 6 months based on history, symptoms, and risk factors May be repeated more often if on medication HIV One time testing for all patients 83 and  older May be repeated more frequently for patients with increased risk factors or exposure Hepatitis C One time testing for all patients 57 and older May be repeated more frequently for patients with increased risk factors or exposure Gonorrhea, Chlamydia Every 12 months for all sexually active persons 13-24 years Additional monitoring may be recommended for those who are considered high risk or who have symptoms PSA Men 15-45 years old with risk factors Additional screening may be recommended from age 41-69 based on risk factors, symptoms, and history  Vaccine Recommendations Tetanus Booster All adults every 10 years Flu Vaccine All patients 6 months and older every year COVID Vaccine All patients 12 years and older Initial dosing with booster May recommend additional booster based on age and health history HPV Vaccine 2 doses all patients age 39-26 Dosing may be considered for patients over 26 Shingles Vaccine (Shingrix) 2 doses all adults 55 years and older Pneumonia (Pneumovax 23) All adults 65 years and older May recommend earlier dosing based on health history Pneumonia (Prevnar 90) All adults 65 years and older Dosed 1 year after Pneumovax 23  Additional Screening, Testing, and Vaccinations may be recommended on an individualized basis based on family history, health history, risk factors, and/or exposure.

## 2024-07-06 NOTE — Assessment & Plan Note (Signed)
 Dry, scaly skin on hands and arms, likely exacerbated by weather changes or irritants. Symptoms have improved with over-the-counter treatments. - Continue over-the-counter hydrocortisone cream and Aquaphor for symptom relief. - Use stretchy gloves with Aquaphor overnight if symptoms persist.

## 2024-07-06 NOTE — Assessment & Plan Note (Signed)

## 2024-07-07 LAB — LIPID PANEL
Chol/HDL Ratio: 2 ratio (ref 0.0–4.4)
Cholesterol, Total: 176 mg/dL (ref 100–199)
HDL: 90 mg/dL (ref >39)
LDL Chol Calc (NIH): 78 mg/dL (ref 0–99)
Triglycerides: 35 mg/dL (ref 0–149)
VLDL Cholesterol Cal: 8 mg/dL (ref 5–40)

## 2024-07-07 LAB — CMP14+EGFR
ALT: 26 IU/L (ref 0–32)
AST: 21 IU/L (ref 0–40)
Albumin: 4.4 g/dL (ref 3.8–4.9)
Alkaline Phosphatase: 53 IU/L (ref 49–135)
BUN/Creatinine Ratio: 30 — ABNORMAL HIGH (ref 9–23)
BUN: 20 mg/dL (ref 6–24)
Bilirubin Total: 0.5 mg/dL (ref 0.0–1.2)
CO2: 24 mmol/L (ref 20–29)
Calcium: 9.4 mg/dL (ref 8.7–10.2)
Chloride: 104 mmol/L (ref 96–106)
Creatinine, Ser: 0.67 mg/dL (ref 0.57–1.00)
Globulin, Total: 2.2 g/dL (ref 1.5–4.5)
Glucose: 96 mg/dL (ref 70–99)
Potassium: 4.3 mmol/L (ref 3.5–5.2)
Sodium: 142 mmol/L (ref 134–144)
Total Protein: 6.6 g/dL (ref 6.0–8.5)
eGFR: 101 mL/min/1.73 (ref >59)

## 2024-07-07 LAB — CBC WITH DIFFERENTIAL/PLATELET
Basophils Absolute: 0 x10E3/uL (ref 0.0–0.2)
Basos: 1 %
EOS (ABSOLUTE): 0.2 x10E3/uL (ref 0.0–0.4)
Eos: 5 %
Hematocrit: 40 % (ref 34.0–46.6)
Hemoglobin: 13.3 g/dL (ref 11.1–15.9)
Immature Grans (Abs): 0 x10E3/uL (ref 0.0–0.1)
Immature Granulocytes: 0 %
Lymphocytes Absolute: 1 x10E3/uL (ref 0.7–3.1)
Lymphs: 26 %
MCH: 30.9 pg (ref 26.6–33.0)
MCHC: 33.3 g/dL (ref 31.5–35.7)
MCV: 93 fL (ref 79–97)
Monocytes Absolute: 0.3 x10E3/uL (ref 0.1–0.9)
Monocytes: 9 %
Neutrophils Absolute: 2.2 x10E3/uL (ref 1.4–7.0)
Neutrophils: 59 %
Platelets: 265 x10E3/uL (ref 150–450)
RBC: 4.3 x10E6/uL (ref 3.77–5.28)
RDW: 12.9 % (ref 11.7–15.4)
WBC: 3.7 x10E3/uL (ref 3.4–10.8)

## 2024-07-09 ENCOUNTER — Ambulatory Visit: Payer: Self-pay | Admitting: Cardiology

## 2024-07-11 NOTE — Telephone Encounter (Signed)
Pt returning call for results. Please advise.
